# Patient Record
Sex: Male | Born: 1950 | Race: White | Hispanic: No | Marital: Married
Health system: Midwestern US, Community
[De-identification: ages and names within clinical notes are randomized; demographics above are authoritative.]

## PROBLEM LIST (undated history)

## (undated) DIAGNOSIS — D509 Iron deficiency anemia, unspecified: Secondary | ICD-10-CM

## (undated) DIAGNOSIS — L570 Actinic keratosis: Secondary | ICD-10-CM

## (undated) DIAGNOSIS — D472 Monoclonal gammopathy: Secondary | ICD-10-CM

## (undated) DIAGNOSIS — N62 Hypertrophy of breast: Secondary | ICD-10-CM

## (undated) DIAGNOSIS — E875 Hyperkalemia: Secondary | ICD-10-CM

## (undated) DIAGNOSIS — Z0000 Encounter for general adult medical examination without abnormal findings: Secondary | ICD-10-CM

## (undated) DIAGNOSIS — B009 Herpesviral infection, unspecified: Secondary | ICD-10-CM

## (undated) DIAGNOSIS — D500 Iron deficiency anemia secondary to blood loss (chronic): Secondary | ICD-10-CM

## (undated) DIAGNOSIS — F32A Depression, unspecified: Secondary | ICD-10-CM

## (undated) DIAGNOSIS — M1990 Unspecified osteoarthritis, unspecified site: Secondary | ICD-10-CM

## (undated) DIAGNOSIS — C4490 Unspecified malignant neoplasm of skin, unspecified: Secondary | ICD-10-CM

## (undated) HISTORY — PX: TONSILLECTOMY: SHX5217

## (undated) HISTORY — PX: BREAST SURGERY: SHX581

## (undated) HISTORY — DX: Unspecified malignant neoplasm of skin, unspecified: C44.90

## (undated) HISTORY — DX: Unspecified osteoarthritis, unspecified site: M19.90

## (undated) HISTORY — DX: Depression, unspecified: F32.A

---

## 1981-09-15 HISTORY — PX: MASTECTOMY FOR GYNECOMASTIA: SUR847

## 1988-09-15 DIAGNOSIS — Z8619 Personal history of other infectious and parasitic diseases: Secondary | ICD-10-CM

## 1988-09-15 HISTORY — DX: Personal history of other infectious and parasitic diseases: Z86.19

## 2009-02-01 MED ORDER — AMOXICILLIN CLAVULANATE 875 MG-125 MG TAB
875-125 mg | ORAL_TABLET | Freq: Two times a day (BID) | ORAL | Status: AC
Start: 2009-02-01 — End: 2009-02-11

## 2009-02-01 NOTE — Progress Notes (Signed)
HISTORY OF PRESENT ILLNESS  Joe Little is a 58 y.o. male.  HPI  Left ear pain for a few days, diminished hearing. Also had 2 tick bites 4 days ago, spreading red rash from the leg lesion, blister on the periumbilical spot.    PMH: depression adequately controlled  Review of Systems   Constitutional: Negative for fever and chills.   Musculoskeletal: Negative for myalgias.       Wt 174 lb (78.926 kg)  Physical Exam   HENT:   Left Ear: Tympanic membrane is injected, erythematous and retracted.   Skin:            ASSESSMENT and PLAN  Encounter Diagnoses   Code Name Primary?   ??? 382.00 Acute suppurative otitis media without spontaneous rupture of eardrum Yes   ??? 919.5D Tick bite, infected        Orders Placed This Encounter   ??? Amoxicillin-clavulanate (augmentin) 875-125 mg per tablet

## 2009-07-30 MED ORDER — ACYCLOVIR 400 MG TAB
400 mg | ORAL_TABLET | Freq: Every day | ORAL | Status: DC
Start: 2009-07-30 — End: 2009-11-05

## 2009-07-30 NOTE — Telephone Encounter (Signed)
RX as ordered this date left for p/u

## 2009-07-30 NOTE — Telephone Encounter (Signed)
Message copied by Fuller Song on Mon Jul 30, 2009  4:08 PM  ------       Message from: Gale Journey       Created: Mon Jul 30, 2009  3:45 PM       Regarding: wein       Contact: [phone]         Requesting script for Acyclovir.                Mail order - 90 days with 3 refills                     Would like to pu tomorrow afternoon                     Any questions - [phone]

## 2009-11-05 MED ORDER — ACYCLOVIR 400 MG TAB
400 mg | ORAL_TABLET | Freq: Every day | ORAL | Status: DC
Start: 2009-11-05 — End: 2010-05-09

## 2009-11-05 NOTE — Telephone Encounter (Signed)
Once a year.

## 2009-11-05 NOTE — Telephone Encounter (Signed)
Message copied by Fuller Song on Mon Nov 05, 2009  9:30 AM  ------       Message from: Vangie Bicker       Created: Mon Nov 05, 2009  8:59 AM       Regarding: wein         #[phone] refill for acyclovir 400 mg 1/day 90 day for mail order.       Pt requesting this to be mailed to his home today please.

## 2009-11-05 NOTE — Telephone Encounter (Signed)
Is he due to come back?

## 2009-11-05 NOTE — Telephone Encounter (Signed)
rx as ordered mailed to pt

## 2010-05-09 MED ORDER — ACYCLOVIR 400 MG TAB
400 mg | ORAL_TABLET | Freq: Every day | ORAL | Status: DC
Start: 2010-05-09 — End: 2010-11-14

## 2010-05-09 NOTE — Telephone Encounter (Signed)
Message copied by Fuller Song on Thu May 09, 2010  9:02 AM  ------       Message from: Dominga Ferry D       Created: Thu May 09, 2010  8:58 AM       Regarding: [name]         Patient is requesting the following Mail Order prescription:              Acyclovir (ZOVIRAX) 400 mg tablet 90 Tab, Take 1 Tab by mouth daily. 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 [place] [place]        [place] [address]              Patient: [phone]

## 2010-05-09 NOTE — Telephone Encounter (Signed)
rx mailed as ordered

## 2010-11-14 MED ORDER — ACYCLOVIR 400 MG TAB
400 mg | ORAL_TABLET | Freq: Every day | ORAL | Status: DC
Start: 2010-11-14 — End: 2011-05-28

## 2010-11-14 NOTE — Telephone Encounter (Signed)
appt set for this month    rx as ordered mailed to pt

## 2010-11-14 NOTE — Telephone Encounter (Signed)
Message copied by Fuller Song on Thu Nov 14, 2010 10:11 AM  ------       Message from: Dara Lords D       Created: Thu Nov 14, 2010  9:42 AM       Regarding: wein         #[id number] Detailed message allowed. Pt is requesting a prescription for acylovir ?mg 1 po qd 3 month supply mailed to his home address. Please advise.               [address]       [place] [address]

## 2010-12-12 LAB — AMB POC URINALYSIS DIP STICK AUTO W/O MICRO
Bilirubin (UA POC): NEGATIVE
Blood (UA POC): NEGATIVE
Glucose (UA POC): NEGATIVE
Ketones (UA POC): NEGATIVE
Leukocyte esterase (UA POC): NEGATIVE
Nitrites (UA POC): NEGATIVE
Specific gravity (UA POC): 1.03 (ref 1.001–1.035)
Urobilinogen (UA POC): 0.2
pH (UA POC): 5 (ref 4.6–8.0)

## 2010-12-12 MED ORDER — ZOSTER VACCINE LIVE (PF) 19,400 UNIT SUB-Q SOLN
19400 unit/0.65 mL | Freq: Once | SUBCUTANEOUS | Status: AC
Start: 2010-12-12 — End: 2010-12-12

## 2010-12-12 NOTE — Progress Notes (Signed)
Hubbard "Roseanne Reno" Cromley is a 60 y.o.male here for complete physical.    Concerns: none, depression adequately controlled.    Past medical history: I have reviewed and confirmed the past medical history in the chart.  Medications:   Current Outpatient Prescriptions   Medication Sig   ??? therapeutic multivitamin (THERAGRAN) tablet Take 1 Tab by mouth daily.     ??? omega-3 fatty acids-vitamin e (FISH OIL) 1,000 mg Cap Take 1 Cap by mouth.     ??? acyclovir (ZOVIRAX) 400 mg tablet Take 1 Tab by mouth daily. Indications: SUPPRESSION OF RECURRENT HERPES SIMPLEX INFECTION   ??? aripiprazole (ABILIFY) 5 mg tablet Take 5 mg by mouth daily. Indications: DEPRESSION TREATMENT ADJUNCT   ??? duloxetine (CYMBALTA) 60 mg capsule Take 120 mg by mouth daily. Indications: MAJOR DEPRESSIVE DISORDER   ??? buPROPion (WELLBUTRIN) 100 mg tablet Take 100 mg by mouth daily.  Unsure if this is correct dose or duration.     Allergies: reviewed allergy section in the chart  Family History: reviewed Fam Hx section in the chart  Social History: reviewed Soc Hx section in the chart. In general he does not exercise much, but he does do a kind of vigorous [place] dancing and has been doing more recently since they are going to perform in the Easter Parade. They are practicing about once a week now, each dance lasts about three minutes. ItUsed to walk the dog regularly, she died in 11/02/2022. They are probably going to get another dog though.  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  Respiratory: some exertional dyspnea as noted above.  Behvioral/Psych: Depression well-controlled, suicidal thoughts are infrequent now.    Objective:           BP 124/70   Pulse 88   Resp 16   Ht 5' 11.5" (1.816 m)   Wt 172 lb 9.6 oz (78.291 kg)   BMI 23.74 kg/m2;            Nursing note reviewed.   General:  Alert, cooperative, no distress, appears stated age.   Head:  Normocephalic, without obvious abnormality, atraumatic.   Eyes:  Conjunctivae/corneas clear. PERRL, EOMs intact.    Ears:  Normal TMs and external ear canals both ears.   Nose: Nares normal. Mucosa normal. No drainage or sinus tenderness.   Throat: Lips, mucosa, and tongue normal. Teeth and gums normal.   Neck: Supple, symmetrical, trachea midline, no adenopathy, thyroid: no enlargment/tenderness/nodules, no carotid bruit and no JVD.   Back:   Symmetric, no curvature. No CVA tenderness.   Lungs:   Clear to auscultation bilaterally.   Chest wall:  No tenderness or deformity.   Heart:  Regular rate and rhythm, S1, S2 normal, no murmur, click, rub or gallop.   Abdomen:   Soft, non-tender. Bowel sounds normal. No masses,  No organomegaly.   Genitalia:  Normal male without testicular mass, atrophy, or hernia. He continues to have a little thickening in the skin over the left inguinal region, but no mass is present.   Rectal:  Deferred.   Extremities: Extremities normal, atraumatic, no cyanosis or edema.   Pulses: 2+ and symmetric all extremities.   Skin: Skin color, texture, turgor normal. No rashes or lesions   Lymph nodes: Cervical and supraclavicular nodes normal.   Neurologic: CNII-XII intact. Normal strength throughout.  Assessment/Plan:  Encounter Diagnoses   Name Primary?   ??? Preventative health care Yes     Orders Placed This Encounter   ??? CBC W/O DIFF   ??? METABOLIC PANEL, COMPREHENSIVE   ??? LIPID PANEL   ??? AMB POC URINALYSIS DIP STICK AUTO W/O MICRO   ??? buPROPion (WELLBUTRIN) 100 mg tablet   ??? therapeutic multivitamin (THERAGRAN) tablet   ??? omega-3 fatty acids-vitamin e (FISH OIL) 1,000 mg Cap   ??? varicella zoster vacine live (ZOSTAVAX) 19,400 unit SolR when he turns 60 in June.     He will return fasting for the above labs.    Follow-up Disposition:  Return in about 1 year (around 12/12/2011).

## 2010-12-12 NOTE — Progress Notes (Signed)
60 yo male in for cpx=non fasting today.  Sees psychiatrist,Dr.Atri,about 3 times/year.  Overdue for vision exam.  No ekg or cxr in last year.  Colonoscopy done 12/08,Dr.Monroe-due to repeat 12/18.  Does not recall date of last Td.

## 2010-12-27 LAB — METABOLIC PANEL, COMPREHENSIVE
A-G Ratio: 1.8 (ref 1.1–2.5)
ALT (SGPT): 19 IU/L (ref 0–55)
AST (SGOT): 24 IU/L (ref 0–40)
Albumin: 4.2 g/dL (ref 3.5–5.5)
Alk. phosphatase: 70 IU/L (ref 25–150)
BUN/Creatinine ratio: 18 (ref 9–20)
BUN: 16 mg/dL (ref 6–24)
Bilirubin, total: 0.2 mg/dL (ref 0.0–1.2)
CO2: 25 mmol/L (ref 20–32)
Calcium: 9 mg/dL (ref 8.7–10.2)
Chloride: 104 mmol/L (ref 97–108)
Creatinine: 0.87 mg/dL (ref 0.76–1.27)
GFR est AA: 109 mL/min/{1.73_m2} (ref >59)
GFR est non-AA: 94 mL/min/{1.73_m2} (ref >59)
GLOBULIN, TOTAL: 2.3 g/dL (ref 1.5–4.5)
Glucose: 94 mg/dL (ref 65–99)
Potassium: 5.1 mmol/L (ref 3.5–5.2)
Protein, total: 6.5 g/dL (ref 6.0–8.5)
Sodium: 140 mmol/L (ref 134–144)

## 2010-12-27 LAB — LIPID PANEL
Cholesterol, total: 174 mg/dL (ref 100–199)
HDL Cholesterol: 51 mg/dL (ref >39)
LDL, calculated: 106 mg/dL — ABNORMAL HIGH (ref 0–99)
Triglyceride: 84 mg/dL (ref 0–149)
VLDL, calculated: 17 mg/dL (ref 5–40)

## 2010-12-27 LAB — CBC W/O DIFF
HCT: 39.4 % (ref 36.0–50.0)
HGB: 12.4 g/dL — ABNORMAL LOW (ref 12.5–17.0)
MCH: 26.6 pg — ABNORMAL LOW (ref 27.0–34.0)
MCHC: 31.5 g/dL — ABNORMAL LOW (ref 32.0–36.0)
MCV: 84 fL (ref 80–98)
PLATELET: 262 10*3/uL (ref 140–415)
RBC: 4.67 x10E6/uL (ref 4.10–5.60)
RDW: 15.1 % — ABNORMAL HIGH (ref 11.7–15.0)
WBC: 4.9 10*3/uL (ref 4.0–10.5)

## 2011-05-28 MED ORDER — ACYCLOVIR 400 MG TAB
400 mg | ORAL_TABLET | Freq: Every day | ORAL | Status: DC
Start: 2011-05-28 — End: 2011-12-10

## 2011-05-28 NOTE — Telephone Encounter (Signed)
Pt transferred to set up March OV.  rx as ordered mailed to pt after md review.

## 2011-05-28 NOTE — Telephone Encounter (Signed)
Pt request that rx be mail to his resident.

## 2011-12-10 NOTE — Telephone Encounter (Signed)
Patient would life for the written prescription to be mailed to his home address.

## 2011-12-10 NOTE — Telephone Encounter (Signed)
ML please call to clarify if this written rx is for mail order that we can now electronically send.

## 2011-12-11 MED ORDER — ACYCLOVIR 400 MG TAB
400 mg | ORAL_TABLET | Freq: Every day | ORAL | Status: DC
Start: 2011-12-11 — End: 2012-09-21

## 2011-12-11 NOTE — Telephone Encounter (Signed)
Pt does want this sent to [facility] home delivery.

## 2012-01-02 LAB — AMB POC URINALYSIS DIP STICK AUTO W/O MICRO
Bilirubin (UA POC): NEGATIVE
Blood (UA POC): NEGATIVE
Glucose (UA POC): NEGATIVE
Ketones (UA POC): NEGATIVE
Leukocyte esterase (UA POC): NEGATIVE
Nitrites (UA POC): NEGATIVE
Protein (UA POC): NEGATIVE mg/dL
Specific gravity (UA POC): 1.025 (ref 1.001–1.035)
Urobilinogen (UA POC): 0.2 (ref 0.2–1)
pH (UA POC): 6 (ref 4.6–8.0)

## 2012-01-02 MED ORDER — CIPROFLOXACIN 500 MG TAB
500 mg | ORAL_TABLET | Freq: Two times a day (BID) | ORAL | Status: AC
Start: 2012-01-02 — End: 2012-01-16

## 2012-01-02 MED ORDER — ZOSTER VACCINE LIVE (PF) 19,400 UNIT SUB-Q SOLN
19400 unit/0.65 mL | Freq: Once | SUBCUTANEOUS | Status: AC
Start: 2012-01-02 — End: 2012-01-02

## 2012-01-02 NOTE — Progress Notes (Signed)
Joe Little is a 61 y.o.male here for complete physical.    Concerns: Depression is well controlled, Dr. Babette Relic is tapering his medications. He is taking the abilify every other day now, and Cymbalta is down to a single 60 mg capsule.    Past medical history: I have reviewed and confirmed the past medical history in the chart.  Medications:   Current Outpatient Prescriptions   Medication Sig   ??? cholecalciferol (VITAMIN D3) 1,000 unit tablet Take  by mouth daily.   ??? acyclovir (ZOVIRAX) 400 mg tablet Take 1 Tab by mouth daily. Indications: SUPPRESSION OF RECURRENT HERPES SIMPLEX INFECTION   ??? buPROPion (WELLBUTRIN) 100 mg tablet Take 100 mg by mouth daily.     ??? therapeutic multivitamin (THERAGRAN) tablet Take 1 Tab by mouth daily.     ??? omega-3 fatty acids-vitamin e (FISH OIL) 1,000 mg Cap Take 1 Cap by mouth.     ??? aripiprazole (ABILIFY) 5 mg tablet Take 5 mg by mouth every other day. Indications: DEPRESSION TREATMENT ADJUNCT   ??? duloxetine (CYMBALTA) 60 mg capsule Take 60 mg by mouth daily. Indications: MAJOR DEPRESSIVE DISORDER     Allergies: reviewed allergy section in the chart  Family History: reviewed Fam Hx section in the chart  Social History: reviewed Soc Hx section in the chart  Review of Systems: complete ROS done and negative except:   Ears, nose, mouth, throat, and face: hearing difficulties mainly with discrimination of voices in the crowd.  Respiratory: occasional DOE, mainly if he choose not vigorously performing the dances.  Cardiovascular: rare palpitations, no exertional chest pain.  Genitourinary:nocturia x 1 usually. Stream is adequate.  Integument/breast: superficial laceration palm of right hand.  Allergic/Immunologic: allergic rhinitis are flaring up a little bit with recent pollen.    Objective:           BP 110/68   Pulse 66   Temp 97.8 ??F (36.6 ??C)   Resp 12   Ht 5' 11.25" (1.81 m)   Wt 171 lb 12.8 oz (77.928 kg)   BMI 23.79 kg/m2   SpO2 98%;            Nursing note reviewed.   General:  Alert, cooperative, no distress, appears stated age.   Head:  Normocephalic, without obvious abnormality, atraumatic.   Eyes:  Conjunctivae/corneas clear. PERRL, EOMs intact.    Ears:  Normal TMs and external ear canals both ears.   Nose: Nares normal. Mucosa normal. No drainage or sinus tenderness.   Throat: Lips, mucosa, and tongue normal. Teeth and gums normal.   Neck: Supple, symmetrical, trachea midline, no adenopathy, thyroid: no enlargment/tenderness/nodules, no carotid bruit and no JVD.   Back:   Symmetric, no curvature. No CVA tenderness.   Lungs:   Clear to auscultation bilaterally.   Chest wall:  No tenderness or deformity.   Heart:  Regular rate and rhythm, S1, S2 normal, no murmur, click, rub or gallop.   Abdomen:   Soft, non-tender. Bowel sounds normal. No masses,  No organomegaly.   Genitalia:  Normal male without testicular mass, atrophy, or hernia.   Rectal:  Normal tone, 1+ enlarged prostate, no masses or tenderness  Guaiac negative stool.   Extremities: Extremities normal, atraumatic, no cyanosis or edema.   Pulses: 2+ and symmetric all extremities.   Skin: Skin color, texture, turgor normal. No rashes or lesions. 2 seborrheic keratoses on back.   Lymph nodes: Cervical and supraclavicular nodes normal.   Neurologic: CNII-XII intact. Normal strength  throughout.     EKG - incomplete RBBB unchanged.  CXR - normal.    Assessment/Plan:  Encounter Diagnoses   Name Primary?   ??? Preventative health care Yes   ??? Laceration of hand    ??? Need for diphtheria-tetanus-pertussis (Tdap) vaccine    ??? Other malaise and fatigue    ??? Prostatitis      Orders Placed This Encounter   ??? XR CHEST PA LAT   ??? TETANUS, DIPHTHERIA TOXOIDS AND ACELLULAR PERTUSSIS VACCINE (TDAP), IN INDIVIDS. >=7, IM   ??? CBC W/O DIFF   ??? METABOLIC PANEL, COMPREHENSIVE   ??? LIPID PANEL   ??? TESTOSTERONE, TOTAL, SERUM   ??? TSH, 3RD GENERATION   ??? APOLIPOPROTEIN B   ??? AMB POC URINALYSIS DIP STICK AUTO W/O MICRO   ??? AMB POC EKG ROUTINE W/  12 LEADS, INTER & REP   ??? cholecalciferol (VITAMIN D3) 1,000 unit tablet   ??? varicella zoster vacine live (VARICELLA-ZOSTER VIRUS INFECTION PROPHYLAXIS) 19,400 unit SusR injection   ??? ciprofloxacin (CIPRO) 500 mg tablet       There are no Patient Instructions on file for this visit.    Follow-up Disposition: Not on File

## 2012-01-02 NOTE — Progress Notes (Signed)
61 yo male in for fasting cpx.  No ekg or cxr in last year.  Sees psychiatrist,Dr.Atri,every 3  months.  Regular vision checks.  Td 2006.  Did not have flu vaccine this year.  Colonoscopy done 12/08-due again 12/18.

## 2012-01-03 LAB — METABOLIC PANEL, COMPREHENSIVE
A-G Ratio: 2 (ref 1.1–2.5)
ALT (SGPT): 30 IU/L (ref 0–44)
AST (SGOT): 23 IU/L (ref 0–40)
Albumin: 4.5 g/dL (ref 3.6–4.8)
Alk. phosphatase: 71 IU/L (ref 25–160)
BUN/Creatinine ratio: 16 (ref 10–22)
BUN: 15 mg/dL (ref 8–27)
Bilirubin, total: 0.3 mg/dL (ref 0.0–1.2)
CO2: 23 mmol/L (ref 20–32)
Calcium: 9 mg/dL (ref 8.6–10.2)
Chloride: 104 mmol/L (ref 97–108)
Creatinine: 0.96 mg/dL (ref 0.76–1.27)
GFR est non-AA: 86 mL/min/{1.73_m2} (ref >59)
GLOBULIN, TOTAL: 2.3 g/dL (ref 1.5–4.5)
Glucose: 85 mg/dL (ref 65–99)
Potassium: 5 mmol/L (ref 3.5–5.2)
Protein, total: 6.8 g/dL (ref 6.0–8.5)
Sodium: 140 mmol/L (ref 134–144)
eGFR If African American: 99 mL/min/{1.73_m2} (ref >59)

## 2012-01-03 LAB — CVD REPORT: PDF IMAGE: 0

## 2012-01-03 LAB — CBC W/O DIFF
HCT: 40 % (ref 37.5–51.0)
HGB: 12.1 g/dL — ABNORMAL LOW (ref 12.6–17.7)
MCH: 24.6 pg — ABNORMAL LOW (ref 26.6–33.0)
MCHC: 30.3 g/dL — ABNORMAL LOW (ref 31.5–35.7)
MCV: 81 fL (ref 79–97)
PLATELET: 259 10*3/uL (ref 140–415)
RBC: 4.92 x10E6/uL (ref 4.14–5.80)
RDW: 14.7 % (ref 12.3–15.4)
WBC: 4.5 10*3/uL (ref 4.0–10.5)

## 2012-01-03 LAB — LIPID PANEL
Cholesterol, total: 159 mg/dL (ref 100–199)
HDL Cholesterol: 43 mg/dL (ref >39)
LDL, calculated: 98 mg/dL (ref 0–99)
Triglyceride: 90 mg/dL (ref 0–149)
VLDL, calculated: 18 mg/dL (ref 5–40)

## 2012-01-03 LAB — TESTOSTERONE, TOTAL: Testosterone: 507 ng/dL (ref 348–1197)

## 2012-01-03 LAB — TSH 3RD GENERATION: TSH: 0.817 u[IU]/mL (ref 0.450–4.500)

## 2012-01-03 LAB — APOLIPOPROTEIN B: Apolipoprotein B: 90 mg/dL — ABNORMAL HIGH (ref 0–79)

## 2012-09-21 NOTE — Telephone Encounter (Signed)
I had to call the patient and re-schedule him due to Dr.McClendon being out of the office sick, the patient states that he will be out of his Zovirax 400mg  before he can come in on 09/27/12.Please call once another doctor has signed off on it and the patient would like it mailed to him and he will then send it to his mail order company.

## 2012-09-27 NOTE — Progress Notes (Signed)
Chief Complaint   Patient presents with   ??? Request For New Medication     Pt presents in office today to establish care, requesting rx for Shingles vaccine    "Reviewed record in preparation for visit and have obtained the necessary documentation"  ]

## 2012-09-27 NOTE — Patient Instructions (Signed)
Plantar Fasciitis: Exercises  Your Care Instructions  Here are some examples of typical rehabilitation exercises for your condition. Start each exercise slowly. Ease off the exercise if you start to have pain.  Your doctor or physical therapist will tell you when you can start these exercises and which ones will work best for you.  How to do the exercises  Note: Each exercise should create a pulling feeling but should not cause pain.  Towel stretch    1. Sit with your legs extended and knees straight.  2. Place a towel around your foot just under the toes. A towel will give you a more effective stretch.  3. Hold each end of the towel in each hand, with your hands above your knees.  4. Pull back with the towel so that your foot stretches toward you.  5. Hold the position for at least 15 to 30 seconds.  6. Repeat 2 to 4 times a session, up to 5 sessions a day.  Calf stretch    Note: This exercise stretches the muscles at the back of the lower leg (the calf) and the Achilles tendon. Do this exercise 3 or 4 times a day, 5 days a week.  1. Stand facing a wall with your hands on the wall at about eye level. Put the leg you want to stretch about a step behind your other leg.  2. Keeping your back heel on the floor, bend your front knee until you feel a stretch in the back leg.  3. Hold the stretch for 15 to 30 seconds. Repeat 2 to 4 times.  Plantar fascia and calf stretch    Note: Stretching the plantar fascia and calf muscles can increase flexibility and decrease heel pain. You can do this exercise several times each day and before and after activity.  1. Stand on a step as shown above. Be sure to hold on to the banister.  2. Slowly let your heels down over the edge of the step as you relax your calf muscles. You should feel a gentle stretch across the bottom of your foot and up the back of your leg to your knee.  3. Hold the stretch about 15 to 30 seconds, and then tighten your calf muscle a little to bring your heel  back up to the level of the step. Repeat 2 to 4 times.  Towel curls    1. While sitting, place your foot on a towel on the floor and scrunch the towel toward you with your toes.  2. Then, also using your toes, push the towel away from you.  Note: Make this exercise more challenging by placing a weighted object, such as a soup can, on the other end of the towel.  Marble pickups    1. Put marbles on the floor next to a cup.  2. Using your toes, try to lift the marbles up from the floor and put them in the cup.  Follow-up care is a key part of your treatment and safety. Be sure to make and go to all appointments, and call your doctor if you are having problems. It's also a good idea to know your test results and keep a list of the medicines you take.    Where can you learn more?    Go to [email or web address]   Enter [phone] in the search box to learn more about "Plantar Fasciitis: Exercises."    ?? 2006-2013 [employer]. Care instructions adapted under license by [employer] (  which disclaims liability or warranty for this information). This care instruction is for use with your licensed healthcare professional. If you have questions about a medical condition or this instruction, always ask your healthcare professional. [employer], Inc[employer] disclaims any warranty or liability for your use of this information.  Content Version: 9.9.209917; Last Revised: July 19, 2010                Plantar Fasciitis: Exercises  Your Care Instructions  Here are some examples of typical rehabilitation exercises for your condition. Start each exercise slowly. Ease off the exercise if you start to have pain.  Your doctor or physical therapist will tell you when you can start these exercises and which ones will work best for you.  How to do the exercises  Note: Each exercise should create a pulling feeling but should not cause pain.  Towel stretch    7. Sit with your legs extended and knees straight.  8. Place  a towel around your foot just under the toes. A towel will give you a more effective stretch.  9. Hold each end of the towel in each hand, with your hands above your knees.  10. Pull back with the towel so that your foot stretches toward you.  11. Hold the position for at least 15 to 30 seconds.  12. Repeat 2 to 4 times a session, up to 5 sessions a day.  Calf stretch    Note: This exercise stretches the muscles at the back of the lower leg (the calf) and the Achilles tendon. Do this exercise 3 or 4 times a day, 5 days a week.  4. Stand facing a wall with your hands on the wall at about eye level. Put the leg you want to stretch about a step behind your other leg.  5. Keeping your back heel on the floor, bend your front knee until you feel a stretch in the back leg.  6. Hold the stretch for 15 to 30 seconds. Repeat 2 to 4 times.  Plantar fascia and calf stretch    Note: Stretching the plantar fascia and calf muscles can increase flexibility and decrease heel pain. You can do this exercise several times each day and before and after activity.  4. Stand on a step as shown above. Be sure to hold on to the banister.  5. Slowly let your heels down over the edge of the step as you relax your calf muscles. You should feel a gentle stretch across the bottom of your foot and up the back of your leg to your knee.  6. Hold the stretch about 15 to 30 seconds, and then tighten your calf muscle a little to bring your heel back up to the level of the step. Repeat 2 to 4 times.  Towel curls    3. While sitting, place your foot on a towel on the floor and scrunch the towel toward you with your toes.  4. Then, also using your toes, push the towel away from you.  Note: Make this exercise more challenging by placing a weighted object, such as a soup can, on the other end of the towel.  Marble pickups    3. Put marbles on the floor next to a cup.  4. Using your toes, try to lift the marbles up from the floor and put them in the cup.   Follow-up care is a key part of your treatment and safety. Be sure to make and go to all  appointments, and call your doctor if you are having problems. It's also a good idea to know your test results and keep a list of the medicines you take.    Where can you learn more?    Go to [email or web address]   Enter [phone] in the search box to learn more about "Plantar Fasciitis: Exercises."    ?? 2006-2013 [employer]. Care instructions adapted under license by [employer] (which disclaims liability or warranty for this information). This care instruction is for use with your licensed healthcare professional. If you have questions about a medical condition or this instruction, always ask your healthcare professional. [employer], [employer] disclaims any warranty or liability for your use of this information.  Content Version: 9.9.209917; Last Revised: July 19, 2010

## 2012-09-28 NOTE — Progress Notes (Signed)
HISTORY OF PRESENT ILLNESS  Joe Little is a 62 y.o. male.  HPI cc: hsv follow up.   Information obtained per pt. This is a chronic problem. Problem has improved. Here for refill of zoivrax. Has had hsv for years. Last outbreak several years ago.     Notes urinary symptoms--  Hesitancy, nocturia, frequency. Had been told by prior pcp that his prostate was enlarged. But never placed on flomax, no psa drawn recently.   No hx of prostate ca. No dysuria, hematuria.     ROS ROS: as per HPI  Past Medical History   Diagnosis Date   ??? Depression    ??? 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 Actinic keratosis      nose   ??? HSV (herpes simplex virus) infection    ??? PUD (peptic ulcer disease) 1982     Patient Active Problem List   Diagnosis Code   ??? Depression 311   ??? Actinic keratosis 702.0   ??? HSV (herpes simplex virus) infection 054.9     History     Social History   ??? Marital Status: MARRIED     Spouse Name: [name]     Number of Children: 2   ??? 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 Smoking status: Never Smoker    ??? Smokeless tobacco: Never Used   ??? Alcohol Use: 0.5 oz/week     1 Cans of beer per week   ??? Drug Use: No   ??? Sexually Active: Yes -- Male partner(s)     Other Topics Concern   ??? Not on file     Social History Narrative   ??? No narrative on file       Past Surgical History   Procedure Laterality Date   ??? Hx tonsillectomy       age 61   ??? Hx vasectomy  1989   ??? Pr breast surgery procedure unlisted  1981     gynecomastia   ??? Endoscopy, colon, diagnostic  12/08     due in 10 years     Family History   Problem Relation Age of Onset   ??? Alcohol abuse Mother    ??? Lung Disease Mother    ??? Alcohol abuse Father    ??? Cancer Sister    ??? Alcohol abuse Sister    ??? Asthma Sister    ??? Elevated Lipids Sister    ??? Hypertension Sister    ??? Stroke Maternal Grandfather    ??? Seizures Daughter      childhood only         No Known Allergies    Current outpatient  prescriptions:acyclovir (ZOVIRAX) 400 mg tablet, Take 1 Tab by mouth daily. Indications: SUPPRESSION OF RECURRENT HERPES SIMPLEX INFECTION, Disp: 90 Tab, Rfl: 6;  [EXPIRED] varicella zoster vacine live (VARICELLA-ZOSTER VIRUS INFECTION PROPHYLAXIS) 19,400 unit susr injection, 1 Vial by SubCUTAneous route once for 1 dose., Disp: 0.65 mL, Rfl: 0;  tamsulosin (FLOMAX) 0.4 mg capsule, Take 1 Cap by mouth daily., Disp: 30 Cap, Rfl: 3  [EXPIRED] varicella zoster vacine live (VARICELLA-ZOSTER VIRUS INFECTION PROPHYLAXIS) 19,400 unit susr injection, 1 Vial by SubCUTAneous route once for 1 dose., Disp: 0.65 mL, Rfl: 0;  cholecalciferol (VITAMIN D3) 1,000 unit tablet, Take  by mouth daily., Disp: , Rfl: ;  buPROPion (WELLBUTRIN) 100 mg tablet,  Take 100 mg by mouth daily.  , Disp: , Rfl: ;  therapeutic multivitamin (THERAGRAN) tablet, Take 1 Tab by mouth daily.  , Disp: , Rfl:   omega-3 fatty acids-vitamin e (FISH OIL) 1,000 mg Cap, Take 1 Cap by mouth.  , Disp: , Rfl: ;  aripiprazole (ABILIFY) 5 mg tablet, Take 5 mg by mouth every other day. Indications: DEPRESSION TREATMENT ADJUNCT, Disp: , Rfl: ;  duloxetine (CYMBALTA) 60 mg capsule, Take 60 mg by mouth daily. Indications: MAJOR DEPRESSIVE DISORDER, Disp: , Rfl:     BP Readings from Last 3 Encounters:   09/27/12 117/67   01/02/12 110/68   12/12/10 124/70     Wt Readings from Last 3 Encounters:   09/27/12 176 lb (79.833 kg)   01/02/12 171 lb 12.8 oz (77.928 kg)   12/12/10 172 lb 9.6 oz (78.291 kg)       BP 117/67   Pulse 83   Temp(Src) 98.7 ??F (37.1 ??C) (Oral)   Resp 18   Ht 5' 11.2" (1.808 m)   Wt 176 lb (79.833 kg)   BMI 24.42 kg/m2  Nursing notes and vital signs reviewed.            Physical Exam   Constitutional: He appears well-developed and well-nourished.   HENT:   Head: Normocephalic and atraumatic.   Cardiovascular: Normal rate and regular rhythm.    Pulmonary/Chest: Effort normal and breath sounds normal.   Psychiatric: He has a normal mood and affect. His behavior  is normal.       ASSESSMENT and PLAN  1. HSV (herpes simplex virus) infection     2. BPH (benign prostatic hyperplasia)  URINALYSIS W/ RFLX MICROSCOPIC   3. Screening for prostate cancer  URINALYSIS W/ RFLX MICROSCOPIC, PROSTATE SPECIFIC AG (PSA)   1. Stable. Cont med.   2, 3--- do trial of flomax. , obtain ua, psa.   Call with results.

## 2012-10-09 LAB — URINALYSIS W/ RFLX MICROSCOPIC
Bilirubin: NEGATIVE
Blood: NEGATIVE
Glucose: NEGATIVE
Ketone: NEGATIVE
Leukocyte Esterase: NEGATIVE
Nitrites: NEGATIVE
Protein: NEGATIVE
Specific Gravity: 1.024 (ref 1.005–1.030)
Urobilinogen: 0.2 mg/dL (ref 0.0–1.9)
pH (UA): 5.5 (ref 5.0–7.5)

## 2012-10-09 LAB — PSA, DIAGNOSTIC (PROSTATE SPECIFIC AG): Prostate Specific Ag: 0.3 ng/mL (ref 0.0–4.0)

## 2012-10-11 NOTE — Progress Notes (Signed)
Quick Note:    Labs mailed to pt  ______

## 2012-10-11 NOTE — Progress Notes (Signed)
Quick Note:    psa wnl,   ua wnl.   Re: screening for prostate ca  Reviewed lab/imaging. Msg sent for nurse to inform pt via phone or mail.    ______

## 2012-12-02 NOTE — Progress Notes (Signed)
HISTORY OF PRESENT ILLNESS  Joe Little is a 62 y.o. male.  HPI Cc: physical exam/ well visit    Nurse note reviewed.     Pt seen today for physical. Sts that he is doing fine. No recent illnesses. Well balanced diet. Active.  No GI, GU, CV symptoms. Denies any n/v/d,  Unintentional weight loss, f/s/c, no hematuria, dysuria, oliguria, urinary frequency, no chest pain, sob, palpitations.         62 yo male in for cpx-non fasting today.Had PSA and u/a done in January.   No ekg or cxr in last year.   Sees NP at psychiatrist-every 3 month ov.   Regular vision checks.   Colonoscopy done 12/08-due again 12/18.   Tdap 2013.   No visits to er or hospital since last ov.      ROS ROS: As stated in HPI. 11 systems reviewed and are otherwise negative.   Past Medical History   Diagnosis Date   ??? Depression    ??? 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 Actinic keratosis      nose   ??? HSV (herpes simplex virus) infection    ??? PUD (peptic ulcer disease) 1982     Patient Active Problem List   Diagnosis Code   ??? Depression 311   ??? Actinic keratosis 702.0   ??? HSV (herpes simplex virus) infection 054.9     History     Social History   ??? Marital Status: MARRIED     Spouse Name: [name]     Number of Children: 2   ??? 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 Smoking status: Never Smoker    ??? Smokeless tobacco: Never Used   ??? Alcohol Use: 0.5 oz/week     1 Cans of beer per week   ??? Drug Use: No   ??? Sexually Active: Yes -- Male partner(s)     Other Topics Concern   ??? Not on file     Social History Narrative   ??? No narrative on file       Past Surgical History   Procedure Laterality Date   ??? Hx tonsillectomy       age 71   ??? Hx vasectomy  1989   ??? Pr breast surgery procedure unlisted  1981     gynecomastia   ??? Endoscopy, colon, diagnostic  12/08     due in 10 years     Family History   Problem Relation Age of Onset   ??? Alcohol abuse Mother    ??? Lung Disease Mother    ??? Alcohol abuse  Father    ??? Cancer Sister    ??? Alcohol abuse Sister    ??? Asthma Sister    ??? Elevated Lipids Sister    ??? Hypertension Sister    ??? Stroke Maternal Grandfather    ??? Seizures Daughter      childhood only         No Known Allergies    Current outpatient prescriptions:ARIPiprazole (ABILIFY) 2 mg tablet, Take 2 mg by mouth daily., Disp: , Rfl: ;  tamsulosin (FLOMAX) 0.4 mg capsule, Take 1 Cap by mouth daily., Disp: 90 Cap, Rfl: 1;  clotrimazole (LOTRIMIN) 1 % topical cream, Apply  to affected area two (2) times a day., Disp: 15 g, Rfl: 0  acyclovir (ZOVIRAX)  400 mg tablet, Take 1 Tab by mouth daily. Indications: SUPPRESSION OF RECURRENT HERPES SIMPLEX INFECTION, Disp: 90 Tab, Rfl: 6;  cholecalciferol (VITAMIN D3) 1,000 unit tablet, Take  by mouth daily., Disp: , Rfl: ;  buPROPion (WELLBUTRIN) 100 mg tablet, Take 100 mg by mouth daily.  , Disp: , Rfl: ;  therapeutic multivitamin (THERAGRAN) tablet, Take 1 Tab by mouth daily.  , Disp: , Rfl:   omega-3 fatty acids-vitamin e (FISH OIL) 1,000 mg Cap, Take 1 Cap by mouth.  , Disp: , Rfl: ;  duloxetine (CYMBALTA) 60 mg capsule, Take 60 mg by mouth daily. Indications: MAJOR DEPRESSIVE DISORDER, Disp: , Rfl:     BP Readings from Last 3 Encounters:   12/02/12 118/70   09/27/12 117/67   01/02/12 110/68     Wt Readings from Last 3 Encounters:   12/02/12 172 lb 9.6 oz (78.291 kg)   09/27/12 176 lb (79.833 kg)   01/02/12 171 lb 12.8 oz (77.928 kg)       BP 118/70   Pulse 75   Temp(Src) 98 ??F (36.7 ??C)   Resp 14   Ht 5' 11.5" (1.816 m)   Wt 172 lb 9.6 oz (78.291 kg)   BMI 23.74 kg/m2   SpO2 98%  Nursing notes and vital signs reviewed.            Physical Exam   Constitutional: He is oriented to person, place, and time. He appears well-developed and well-nourished.   Pleasant, nad   HENT:   Head: Normocephalic and atraumatic.   Right Ear: Hearing, tympanic membrane, external ear and ear canal normal.   Left Ear: Hearing, tympanic membrane, external ear and ear canal normal.   Nose: Nose  normal.   Mouth/Throat: Uvula is midline, oropharynx is clear and moist and mucous membranes are normal.   Eyes: Conjunctivae are normal. Pupils are equal, round, and reactive to light.   Neck: Normal range of motion. Neck supple.   Cardiovascular: Normal rate, regular rhythm and normal heart sounds.    Pulmonary/Chest: Effort normal and breath sounds normal.   Abdominal: Soft. Bowel sounds are normal. He exhibits no distension. There is no tenderness.   Musculoskeletal: Normal range of motion.   Neurological: He is alert and oriented to person, place, and time.   Skin: Skin is warm and dry.   Ringworm lesion medial aspect of R ankle   Psychiatric: His behavior is normal.   Flat affect       ASSESSMENT and PLAN    ICD-9-CM    1. Routine general medical examination at a health care facility V70.0 AMB POC EKG ROUTINE W/ 12 LEADS, INTER & REP     CBC W/O DIFF     METABOLIC PANEL, COMPREHENSIVE     LIPID PANEL     TSH, 3RD GENERATION   2. Ringworm 110.9      ekg reviewed. Stable no acute changes.   Exam as detailed above.  Health maintenance concerns were discussed as well as providing counseling w/r/t dietary choices, and exercise. Labs ordered . Await results which dictate further management.   F/u in 1 yr for physical.   Ringworm -- lotrimin  Discussed shingles vaccine. Some issues with ins.     Follow-up Disposition:  Return in about 1 year (around 12/02/2013).

## 2012-12-02 NOTE — Progress Notes (Signed)
Quick Note:    RBBB unchanged  ______

## 2012-12-02 NOTE — Progress Notes (Signed)
62 yo male in for cpx-non fasting today.Had PSA and u/a done in January.  No ekg or cxr in last year.  Sees NP at psychiatrist-every 3 month ov.  Regular vision checks.  Colonoscopy done 12/08-due again 12/18.  Tdap 2013.  No visits to er or hospital since last ov.  Med list reviewed.  ..  Reviewed Record in Preparation For Visit And Have Obtained The Necessary Documentation.

## 2012-12-04 NOTE — Addendum Note (Signed)
Addended by: Luberta Mutter on: 12/04/2012 12:21 PM     Modules accepted: Orders

## 2012-12-06 ENCOUNTER — Encounter

## 2012-12-06 LAB — METABOLIC PANEL, COMPREHENSIVE
A-G Ratio: 2 (ref 1.1–2.5)
ALT (SGPT): 19 IU/L (ref 0–44)
AST (SGOT): 20 IU/L (ref 0–40)
Albumin: 4.3 g/dL (ref 3.6–4.8)
Alk. phosphatase: 59 IU/L (ref 39–117)
BUN/Creatinine ratio: 15 (ref 10–22)
BUN: 16 mg/dL (ref 8–27)
Bilirubin, total: 0.4 mg/dL (ref 0.0–1.2)
CO2: 26 mmol/L (ref 19–28)
Calcium: 9.3 mg/dL (ref 8.6–10.2)
Chloride: 104 mmol/L (ref 97–108)
Creatinine: 1.06 mg/dL (ref 0.76–1.27)
GFR est AA: 87 mL/min/{1.73_m2} (ref >59)
GFR est non-AA: 75 mL/min/{1.73_m2} (ref >59)
GLOBULIN, TOTAL: 2.1 g/dL (ref 1.5–4.5)
Glucose: 95 mg/dL (ref 65–99)
Potassium: 6 mmol/L — ABNORMAL HIGH (ref 3.5–5.2)
Protein, total: 6.4 g/dL (ref 6.0–8.5)
Sodium: 141 mmol/L (ref 134–144)

## 2012-12-06 LAB — CBC W/O DIFF
HCT: 44.3 % (ref 37.5–51.0)
HGB: 14.6 g/dL (ref 12.6–17.7)
MCH: 29.2 pg (ref 26.6–33.0)
MCHC: 33 g/dL (ref 31.5–35.7)
MCV: 89 fL (ref 79–97)
PLATELET: 237 10*3/uL (ref 155–379)
RBC: 5 x10E6/uL (ref 4.14–5.80)
RDW: 14.8 % (ref 12.3–15.4)
WBC: 4.1 10*3/uL (ref 3.4–10.8)

## 2012-12-06 LAB — LIPID PANEL
Cholesterol, total: 164 mg/dL (ref 100–199)
HDL Cholesterol: 45 mg/dL (ref >39)
LDL, calculated: 105 mg/dL — ABNORMAL HIGH (ref 0–99)
Triglyceride: 70 mg/dL (ref 0–149)
VLDL, calculated: 14 mg/dL (ref 5–40)

## 2012-12-06 LAB — CVD REPORT: PDF IMAGE: 0

## 2012-12-06 LAB — TSH 3RD GENERATION: TSH: 0.634 u[IU]/mL (ref 0.450–4.500)

## 2012-12-06 NOTE — Progress Notes (Signed)
Quick Note:    Hyperkalemia (high potassium) on labs.   Recheck to ensure (could be lab error) . If still elevated will give kayexalate.   Reviewed lab/imaging. Msg sent for nurse to inform pt.      ______

## 2012-12-06 NOTE — Progress Notes (Signed)
Quick Note:    T.c to pt, made aware of results below, understanding voiced. Pt states he will stop by office this week for lab only.       ______

## 2012-12-06 NOTE — Progress Notes (Signed)
Quick Note:    Otherwise labs essentially wnl. Will send letter once recheck of potassium is done.  ______

## 2012-12-11 LAB — POTASSIUM: Potassium: 5.2 mmol/L (ref 3.5–5.2)

## 2012-12-15 NOTE — Progress Notes (Signed)
Quick Note:    T.c to pt, made aware of results below, understanding voiced.     ______

## 2012-12-15 NOTE — Progress Notes (Signed)
Quick Note:    Repeated potassium level normal.   Reviewed lab/imaging. Msg sent for nurse to inform pt.      ______

## 2013-05-03 NOTE — Progress Notes (Signed)
HISTORY OF PRESENT ILLNESS  Joe Little is a 62 y.o. male.  HPI: Patient comes in C/O sores and pain in his right lower lip for twp days. He has history of genital herpes and takes and takes Zovirax 400 mg daily.  Past Medical History   Diagnosis Date   ??? Depression    ??? 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 Actinic keratosis      nose   ??? HSV (herpes simplex virus) infection    ??? PUD (peptic ulcer disease) 1982     Past Surgical History   Procedure Laterality Date   ??? Hx tonsillectomy       age 48   ??? Hx vasectomy  1989   ??? Pr breast surgery procedure unlisted  1981     gynecomastia   ??? Endoscopy, colon, diagnostic  12/08     due in 10 years   No Known Allergies  Current outpatient prescriptions:magic mouthwash solution, Pharmacy to mix equal portions of ingredients to a total volume as indicated in the dispense amount., Disp: 120 mL, Rfl: 0;  ARIPiprazole (ABILIFY) 2 mg tablet, Take 2 mg by mouth daily., Disp: , Rfl: ;  tamsulosin (FLOMAX) 0.4 mg capsule, Take 1 Cap by mouth daily., Disp: 90 Cap, Rfl: 1  acyclovir (ZOVIRAX) 400 mg tablet, Take 1 Tab by mouth daily. Indications: SUPPRESSION OF RECURRENT HERPES SIMPLEX INFECTION, Disp: 90 Tab, Rfl: 6;  buPROPion (WELLBUTRIN) 100 mg tablet, Take 100 mg by mouth daily.  , Disp: , Rfl: ;  therapeutic multivitamin (THERAGRAN) tablet, Take 1 Tab by mouth daily.  , Disp: , Rfl: ;  omega-3 fatty acids-vitamin e (FISH OIL) 1,000 mg Cap, Take 1 Cap by mouth.  , Disp: , Rfl:   duloxetine (CYMBALTA) 60 mg capsule, Take 60 mg by mouth daily. Indications: MAJOR DEPRESSIVE DISORDER, Disp: , Rfl: ;  clotrimazole (LOTRIMIN) 1 % topical cream, Apply  to affected area two (2) times a day., Disp: 15 g, Rfl: 0;  cholecalciferol (VITAMIN D3) 1,000 unit tablet, Take  by mouth daily., Disp: , Rfl:   Review of Systems   Constitutional: Negative.    Respiratory: Negative.    Cardiovascular: Negative.      Blood pressure 110/70, pulse 88, temperature 98.9 ??F (37.2 ??C), temperature source Oral,  resp. rate 16, height 5' 11.5" (1.816 m), weight 168 lb (76.204 kg), SpO2 97.00%.    Physical Exam   Nursing note and vitals reviewed.  Constitutional: He appears well-developed and well-nourished.   HENT:   Two lesions on her right lower lip. With some yellowish material in the middle   Neck: Neck supple.   Cardiovascular: Normal rate and regular rhythm.    No murmur heard.  Pulmonary/Chest: Effort normal and breath sounds normal.       ASSESSMENT and PLAN    ICD-9-CM    1. History of cold sores V12.09    2. HSV (herpes simplex virus) infection 054.9 magic mouthwash solution   advised to increase Zovirax 400 mg to tid for 5 days

## 2013-05-03 NOTE — Progress Notes (Signed)
Chief Complaint   Patient presents with   ??? Lip Swelling     Sore on the inside of bottom lip, for about a week.  Have been bitting that area when eating for about a month.     " REVIEWED RECORD IN PREPARATION FOR VISIT AND HAVE OBTAINED THE NECESSARY DOCUMENTATION"

## 2013-06-28 NOTE — Telephone Encounter (Signed)
Message copied by Metta Clines on Tue Jun 28, 2013 11:36 AM  ------       Message from: Festus Aloe       Created: Tue Jun 28, 2013 10:29 AM       Regarding: NP [place]/Refill       Contact: [phone]         Pt. requested a refill on Rx("Tamsulosin") mailed to his address on file.         ------

## 2013-08-19 NOTE — Progress Notes (Signed)
Chief Complaint   Patient presents with   ??? Anal Bleeding     "bright red blood in stool twice last week, denies pain"      Pt would also like shingles vaccine RX      "REVIEWED RECORD IN PREPARATION FOR VISIT AND HAVE OBTAINED THE NECESSARY DOCUMENTATION"

## 2013-08-20 LAB — METABOLIC PANEL, BASIC
BUN/Creatinine ratio: 19 (ref 10–22)
BUN: 18 mg/dL (ref 8–27)
CO2: 29 mmol/L (ref 18–29)
Calcium: 9.2 mg/dL (ref 8.6–10.2)
Chloride: 102 mmol/L (ref 97–108)
Creatinine: 0.94 mg/dL (ref 0.76–1.27)
GFR est AA: 100 mL/min/{1.73_m2} (ref >59)
GFR est non-AA: 87 mL/min/{1.73_m2} (ref >59)
Glucose: 85 mg/dL (ref 65–99)
Potassium: 5.4 mmol/L — ABNORMAL HIGH (ref 3.5–5.2)
Sodium: 142 mmol/L (ref 134–144)

## 2013-08-20 LAB — CBC WITH AUTOMATED DIFF
ABS. BASOPHILS: 0 10*3/uL (ref 0.0–0.2)
ABS. EOSINOPHILS: 0.1 10*3/uL (ref 0.0–0.4)
ABS. IMM. GRANS.: 0 10*3/uL (ref 0.0–0.1)
ABS. MONOCYTES: 0.7 10*3/uL (ref 0.1–0.9)
ABS. NEUTROPHILS: 3.6 10*3/uL (ref 1.4–7.0)
Abs Lymphocytes: 1.8 10*3/uL (ref 0.7–3.1)
BASOPHILS: 1 %
EOSINOPHILS: 1 %
HCT: 41.6 % (ref 37.5–51.0)
HGB: 13.5 g/dL (ref 12.6–17.7)
IMMATURE GRANULOCYTES: 0 %
Lymphocytes: 29 %
MCH: 28.8 pg (ref 26.6–33.0)
MCHC: 32.5 g/dL (ref 31.5–35.7)
MCV: 89 fL (ref 79–97)
MONOCYTES: 11 %
NEUTROPHILS: 58 %
PLATELET: 231 10*3/uL (ref 150–379)
RBC: 4.68 x10E6/uL (ref 4.14–5.80)
RDW: 14 % (ref 12.3–15.4)
WBC: 6.2 10*3/uL (ref 3.4–10.8)

## 2013-08-20 LAB — CEA: CEA: 2.4 ng/mL (ref 0.0–4.7)

## 2013-08-20 NOTE — Progress Notes (Signed)
PROBLEM LIST  1. Rectal bleed        MEDICATION LIST  Current Outpatient Prescriptions on File Prior to Visit   Medication Sig Dispense Refill   ??? tamsulosin (FLOMAX) 0.4 mg capsule Take 1 capsule by mouth daily.  90 capsule  3   ??? ARIPiprazole (ABILIFY) 2 mg tablet Take 2 mg by mouth daily.       ??? acyclovir (ZOVIRAX) 400 mg tablet Take 1 Tab by mouth daily. Indications: SUPPRESSION OF RECURRENT HERPES SIMPLEX INFECTION  90 Tab  6   ??? buPROPion (WELLBUTRIN) 100 mg tablet Take 100 mg by mouth daily.         ??? therapeutic multivitamin (THERAGRAN) tablet Take 1 Tab by mouth daily.         ??? omega-3 fatty acids-vitamin e (FISH OIL) 1,000 mg Cap Take 1 Cap by mouth.         ??? duloxetine (CYMBALTA) 60 mg capsule Take 60 mg by mouth daily. Indications: MAJOR DEPRESSIVE DISORDER       ??? magic mouthwash solution Pharmacy to mix equal portions of ingredients to a total volume as indicated in the dispense amount.  120 mL  0   ??? clotrimazole (LOTRIMIN) 1 % topical cream Apply  to affected area two (2) times a day.  15 g  0   ??? cholecalciferol (VITAMIN D3) 1,000 unit tablet Take  by mouth daily.         No current facility-administered medications on file prior to visit.       CHIEF COMPLAINT   Chief Complaint   Patient presents with   ??? Anal Bleeding     "bright red blood in stool twice last week, denies pain"            HPI  Joe Little is a 62 y.o. presenting for  Anal Bleeding    The patient comes in with complaints of bleeding per rectum.  He says he has had two bouts of rectal bleeding over the past one week.  Each time the blood was mixed with the stools.  No frank blood was present.  No dripping of blood was present.  He is not sure if any blood was present when he wiped his stools.  No pain on defecation is present.  History of hemorrhoids in the remote past is present.  No history of hemorrhoids in the recent past is present.  No pain is present.  No constipation is present.  No diarrhea is present.  No black stools  are present.  No abdominal pain or pelvic pain is present.  No nausea or vomiting.  No hemoptysis.  No hematemesis is present.  He says he had a colonoscopy two years ago, which was normal, except for a polyp.  He is not sure who the gastroenterologist was at that time.  He appears to be stable.  No dizziness is present.        Patient is here for (The encounter diagnosis was Rectal bleed.).     PAST MEDICAL HISTORY  Past Medical History   Diagnosis Date   ??? Depression    ??? 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 Actinic keratosis      nose   ??? HSV (herpes simplex virus) infection    ??? PUD (peptic ulcer disease) 1982       PAST SURGICAL HISTORY   has past surgical history that includes tonsillectomy; vasectomy (1989); breast surgery procedure unlisted (  1981); and endoscopy, colon, diagnostic (12/08).    ALLERGIES  No Known Allergies      FAMILY HISTORY/ SOCIAL HISTORY  History     Social History   ??? Marital Status: MARRIED     Spouse Name: [name]     Number of Children: 2   ??? 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 Smoking status: Never Smoker    ??? Smokeless tobacco: Never Used   ??? Alcohol Use: 0.5 oz/week     1 Cans of beer per week   ??? Drug Use: No   ??? Sexually Active: Yes -- Male partner(s)     Other Topics Concern   ??? Not on file     Social History Narrative   ??? No narrative on file         ROS  Constitutional: No recent weight change   ENT/Mouth:  No epistaxis     Cardiovascular:   no chest pain, no SOB   Respiratory: No hemoptysis, No stridor   Gastrointestinal: No hematemesis   Genitourinary: No blood in urine   Musculoskeletal:   No joint redness       Neurological:  No recent seizures, no new numbness, tingling or focal weakness   Psychiatric:   No hallucinations, paranoia or suspiciousness.   Endocrine: No heat or cold intolerance, No polydipsia   Hematologic/Lymphatic:    Eye:  Skin: No bleeding/bruising tendency    No pain   No change in color or  size of any moles     Rest of the ROS per HPI    Any X rays if ordered were personally reviewed.   Old records were reviewed.  Old lab tests were reviewed.  Old radiology reports were reviewed.  Old medicine tests were reviewed.    PHYSICAL EXAMINATION    Visit Vitals   Item Reading   ??? BP 131/82   ??? Pulse 72   ??? Temp(Src) 98 ??F (36.7 ??C) (Oral)   ??? Resp 14   ??? Ht 5' 11.5" (1.816 m)   ??? Wt 176 lb (79.833 kg)   ??? BMI 24.21 kg/m2   ??? SpO2 100%       GEN   AAO x 3, NAD, mood and affect are normal  HEENT  PERRL EOMI no conjunctival pallor  NECK   Supple     No thyromegaly     No JVD, carotid pulse good  RS   Clear to auscultation bilaterally, No crackles, wheezes or rales     No hyperesonance      Tactile fremitus wnl     No respiratory distress or use of accessory muscles  CVS   RRR No murmurs, gallops or rubs  ABDOMEN             Not tender NABS     No rebound/ guarding or rigidity     No hepatomegaly No splenomegaly   No masses, femoral pulse good  PERIANAL EXAM:  No external hemorrhoids or fissures are seen.  No masses are seen.  We will defer rectal exam.  No anoscope is available today.            ASSESSMENT/ PLAN    1. Rectal bleed      Rectal bleeding.  CBC and chem-7 and CEA will be done.  Unfortunately, we are not able to do anoscope in the  clinic today.  The patient will be referred to GI, Dr. Steva Ready for further evaluation and treatment and possible CAT scan and possible sigmoidoscopy and endoscopy.  Last colonoscopy was done two years ago revealed a polyp only.  I am not able to appreciate any external lesions.          Orders Placed This Encounter   ??? CBC WITH AUTOMATED DIFF   ??? METABOLIC PANEL, BASIC   ??? CEA   ??? REFERRAL TO GASTROENTEROLOGY     Referral Priority:  Routine     Referral Type:  Consultation     Referral Reason:  Specialty Services Required     Number of Visits Requested:  1   ??? PR COLLECTION VENOUS BLOOD,VENIPUNCTURE   ??? PR HANDLG&/OR CONVEY OF SPEC FOR TR OFFICE TO LAB   ??? CYANOCOBALAMIN,  VITAMIN B-12, (VITAMIN B-12 PO)     Sig: Take  by mouth.               Patient agreed and verbalized understanding  Patient education was done  Possible side effects of all medications were explained to the patient and the patient expressed desire to try the medications despite explanation of the risks  Advised to go to ER immediately if symptoms worsen  f/u with PCP in a week

## 2013-08-20 NOTE — Progress Notes (Signed)
Quick Note:    Potassium high   Repeat chem 7 on the 15th- lab visit only       ______

## 2013-08-22 ENCOUNTER — Encounter

## 2013-08-22 NOTE — Progress Notes (Signed)
Quick Note:    Pt agreed. Patient is going to return for a chem 7 on 08/29/13  ______

## 2013-08-25 NOTE — Telephone Encounter (Signed)
Patient is scheduled to see Dr.Talreja on 10/06/13 there office is requesting any notes and labs associated with this visit.There office # [phone] and there fax # [phone].

## 2013-08-25 NOTE — Telephone Encounter (Signed)
Pt last OV note and labs were faxed per patient request.

## 2013-08-30 LAB — METABOLIC PANEL, BASIC
BUN/Creatinine ratio: 18 (ref 10–22)
BUN: 16 mg/dL (ref 8–27)
CO2: 24 mmol/L (ref 18–29)
Calcium: 9.1 mg/dL (ref 8.6–10.2)
Chloride: 102 mmol/L (ref 97–108)
Creatinine: 0.89 mg/dL (ref 0.76–1.27)
GFR est AA: 106 mL/min/{1.73_m2} (ref >59)
GFR est non-AA: 92 mL/min/{1.73_m2} (ref >59)
Glucose: 97 mg/dL (ref 65–99)
Potassium: 5 mmol/L (ref 3.5–5.2)
Sodium: 141 mmol/L (ref 134–144)

## 2013-08-30 NOTE — Progress Notes (Signed)
Quick Note:    Labs wnl     ______

## 2013-08-30 NOTE — Progress Notes (Signed)
Quick Note:    Pt informed labs normal.  ______

## 2013-09-01 LAB — SPECIMEN STATUS REPORT

## 2013-10-28 ENCOUNTER — Encounter

## 2013-10-28 MED ORDER — ACYCLOVIR 400 MG TAB
400 mg | ORAL_TABLET | Freq: Every day | ORAL | Status: DC
Start: 2013-10-28 — End: 2014-11-15

## 2014-04-11 ENCOUNTER — Encounter

## 2014-04-11 LAB — AMB POC URINALYSIS DIP STICK AUTO W/O MICRO
Bilirubin (UA POC): NEGATIVE
Blood (UA POC): NEGATIVE
Glucose (UA POC): NEGATIVE
Ketones (UA POC): NEGATIVE
Leukocyte esterase (UA POC): NEGATIVE
Nitrites (UA POC): NEGATIVE
Specific gravity (UA POC): 1.02 (ref 1.001–1.035)
Urobilinogen (UA POC): 0.2 (ref 0.2–1)
pH (UA POC): 7.5 (ref 4.6–8.0)

## 2014-04-11 MED ORDER — CYCLOBENZAPRINE 5 MG TAB
5 mg | ORAL_TABLET | Freq: Two times a day (BID) | ORAL | Status: AC
Start: 2014-04-11 — End: ?

## 2014-04-11 MED ORDER — DICLOFENAC 75 MG TAB, DELAYED RELEASE
75 mg | ORAL_TABLET | Freq: Two times a day (BID) | ORAL | Status: AC
Start: 2014-04-11 — End: ?

## 2014-04-11 NOTE — Progress Notes (Signed)
PROBLEM LIST  1. Annual physical exam    2. Depression    3. Neck pain    4. Gynecomastia    5. Proteinuria        MEDICATION LIST  Current Outpatient Prescriptions on File Prior to Visit   Medication Sig Dispense Refill   ??? acyclovir (ZOVIRAX) 400 mg tablet Take 1 Tab by mouth daily. Indications: SUPPRESSION OF RECURRENT HERPES SIMPLEX INFECTION 90 Tab 6   ??? CYANOCOBALAMIN, VITAMIN B-12, (VITAMIN B-12 PO) Take  by mouth.     ??? tamsulosin (FLOMAX) 0.4 mg capsule Take 1 capsule by mouth daily. 90 capsule 3   ??? ARIPiprazole (ABILIFY) 2 mg tablet Take 2 mg by mouth daily.     ??? clotrimazole (LOTRIMIN) 1 % topical cream Apply  to affected area two (2) times a day. 15 g 0   ??? buPROPion (WELLBUTRIN) 100 mg tablet Take 100 mg by mouth daily.       ??? therapeutic multivitamin (THERAGRAN) tablet Take 1 Tab by mouth daily.       ??? omega-3 fatty acids-vitamin e (FISH OIL) 1,000 mg Cap Take 1 Cap by mouth.       ??? duloxetine (CYMBALTA) 60 mg capsule Take 60 mg by mouth daily. Indications: MAJOR DEPRESSIVE DISORDER     ??? magic mouthwash solution Pharmacy to mix equal portions of ingredients to a total volume as indicated in the dispense amount. 120 mL 0   ??? cholecalciferol (VITAMIN D3) 1,000 unit tablet Take  by mouth daily.       No current facility-administered medications on file prior to visit.       CHIEF COMPLAINT   Chief Complaint   Patient presents with   ??? Well Male           HPI  Joe Little is a 63 y.o. presenting for  Well Male    The patient is here for a physical exam and multiple things.  No chest pain, no difficulty in breathing, no nausea, no vomiting, no numbness, no tingling or focal weakness.  No cough, no palpitations.  No fevers, no chills, no body aches.  Mood is good.  No suicidal ideation, no homicidal ideation.  No recent loss of weight.  No loss of appetite.  No hemoptysis.  No hematemesis.  No blood in the stools.  No black stools.  No  constipation.  No diarrhea.  He does have depression.  This is stable.  No suicidal ideation, no homicidal ideation.  No crying spells.  No early morning awakening.  No suicidal plans.  No access to any agents of suicide or homicide is present.  Also, he is complaining of some neck pain on the left side.  No midline pain is present.  It has been going on for the past three to four weeks.  No radiation of the pain is present.  No numbness, no tingling, no focal weakness is present.  He has used over-the-counter medication with no improvement in pain.  Also, he has swelling of the breasts.  Gynecomastia is present.  He is wondering what he can do about it.  Ultrasound will be done and we will go from there.  No redness is present.  No tenderness is present.  Also, protein on the urine.  No history of multiple myeloma.  No polyuria or polyphagia.  No nephrotic syndrome is present.      Patient is here for (The primary encounter diagnosis was Annual physical exam. Diagnoses  of Depression, Neck pain, Gynecomastia, and Proteinuria were also pertinent to this visit.).     PAST MEDICAL HISTORY  Past Medical History   Diagnosis Date   ??? Depression    ??? 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 Actinic keratosis      nose   ??? HSV (herpes simplex virus) infection    ??? PUD (peptic ulcer disease) 1982       PAST SURGICAL HISTORY   has past surgical history that includes tonsillectomy; vasectomy (1989); breast surgery procedure unlisted (1981); and endoscopy, colon, diagnostic (12/08).    ALLERGIES  No Known Allergies      FAMILY HISTORY/ SOCIAL HISTORY  History     Social History   ??? Marital Status: MARRIED     Spouse Name: [name]     Number of Children: 2   ??? 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 Smoking status: Never Smoker    ??? Smokeless tobacco: Never Used   ??? Alcohol Use: 0.5 oz/week     1 Cans of beer per week   ??? Drug Use: No   ??? Sexual Activity:      Partners: Female     Other Topics Concern   ??? Not on file     Social History Narrative         ROS  Constitutional: No recent weight change   ENT/Mouth:  No epistaxis     Cardiovascular:   no chest pain, no SOB   Respiratory: No hemoptysis, No stridor   Gastrointestinal: No hematemesis   Genitourinary: No blood in urine   Musculoskeletal:   No joint redness       Neurological:  No recent seizures, no new numbness, tingling or focal weakness   Psychiatric:   No hallucinations, paranoia or suspiciousness.   Endocrine: No heat or cold intolerance, No polydipsia   Hematologic/Lymphatic:    Eye:  Skin: No bleeding/bruising tendency    No pain   No change in color or size of any moles     Rest of the ROS per HPI    Any X rays if ordered were personally reviewed.   Old records were reviewed.  Old lab tests were reviewed.  Old radiology reports were reviewed.  Old medicine tests were reviewed.    PHYSICAL EXAMINATION    Visit Vitals   Item Reading   ??? BP 105/67 mmHg   ??? Pulse 68   ??? Temp(Src) 97.9 ??F (36.6 ??C) (Oral)   ??? Resp 18   ??? Ht 5' 11.5" (1.816 m)   ??? Wt 169 lb (76.658 kg)   ??? BMI 23.24 kg/m2   ??? SpO2 98%       GEN   AAO x 3, NAD, mood and affect are normal  HEENT  PERRL EOMI no conjunctival pallor  NECK   Supple     No thyromegaly     No JVD, carotid pulse good  RS   Clear to auscultation bilaterally, No crackles, wheezes or rales     No hyperesonance      Tactile fremitus wnl     No respiratory distress or use of accessory muscles  CVS   RRR No murmurs, gallops or rubs  ABDOMEN             Not tender NABS     No rebound/  guarding or rigidity     No hepatomegaly No splenomegaly   No masses, femoral pulse good  EXT   No CCE  CNS:  Cranial nerves II - XII are intact.  Power is 5/5 bilaterally.  Sensation is intact.  Romberg is negative.  Knee reflexes are 2+ bilaterally.   HEENT:  Pharynx is normal.  No exudates are seen.  Tympanic membranes are normal.  There is no sinus tenderness.  Nasal mucosa is pink.    No axillary lymphadenopathy.  No inguinal lymphadenopathy is present.   GU EXAM:  He refuses scrotal exam.   PROSTATE EXAM:  Prostate is slightly enlarged, symmetrical.  No nodules are felt.  It is firm in consistency.  No tenderness is present.          ASSESSMENT/ PLAN    1. Annual physical exam    2. Depression    3. Neck pain    4. Gynecomastia    5. Proteinuria      Physical examination as above.   Counseled in detail on diet and exercise.  EKG will be done.  Blood test will be done.    Stable.  Continue present management.  No suicidal ideation, no homicidal ideation.    We will start the patient on the below medications.  X-ray of the neck will be done.    Ultrasound of the breast bilaterally will be done.    UPEP and SPEP will be done.      He will follow up with GI as soon as possible for colonoscopy.  Number and referral was given.         Orders Placed This Encounter   ??? XR SPINE CERV 4 OR 5 V     Standing Status: Future      Number of Occurrences: 1      Standing Expiration Date: 10/09/2014     Order Specific Question:  Reason for Exam     Answer:  pain     Order Specific Question:  Is Patient Allergic to Contrast Dye?     Answer:  No   ??? US BREAST BI COMPLETE     Standing Status: Future      Number of Occurrences:       Standing Expiration Date: 05/13/2015     Order Specific Question:  Reason for Exam     Answer:  gynecomastia   ??? CBC WITH AUTOMATED DIFF   ??? METABOLIC PANEL, BASIC   ??? LIPID PANEL   ??? TSH, 3RD GENERATION   ??? HEPATIC FUNCTION PANEL   ??? VITAMIN D, 25 HYDROXY   ??? HEMOGLOBIN A1C   ??? PROSTATE SPECIFIC AG   ??? PROTEIN ELECTROPHORESIS   ??? PROTEIN ELECTROPHORESIS, URINE RANDOM   ??? REFERRAL TO GASTROENTEROLOGY     Referral Priority:  Routine     Referral Type:  Consultation     Referral Reason:  Specialty Services Required     Number of Visits Requested:  1   ??? AMB POC URINALYSIS DIP STICK AUTO W/O MICRO   ??? AMB POC EKG ROUTINE W/ 12 LEADS, INTER & REP      Order Specific Question:  Reason for Exam:     Answer:  ANNUAL EXAM   ??? PR COLLECTION VENOUS BLOOD,VENIPUNCTURE   ??? PR HANDLG&/OR CONVEY OF SPEC FOR TR OFFICE TO LAB   ??? diclofenac EC (VOLTAREN) 75 mg EC tablet     Sig: Take 1 Tab by mouth two (2) times a  day.     Dispense:  60 Tab     Refill:  0   ??? cyclobenzaprine (FLEXERIL) 5 mg tablet     Sig: Take 1 Tab by mouth two (2) times a day.     Dispense:  60 Tab     Refill:  0               Patient agreed and verbalized understanding  Patient education was done  Possible side effects of all medications were explained to the patient and the patient expressed desire to try the medications despite explanation of the risks  Advised to go to ER immediately if symptoms worsen  f/u with PCP in a week

## 2014-04-11 NOTE — Progress Notes (Signed)
Quick Note:        Please let lab know have added upep and spep to labs    ______

## 2014-04-11 NOTE — Progress Notes (Signed)
Chief Complaint   Patient presents with   ??? Well Male     1. Have you been to the ER, urgent care clinic since your last visit?  Hospitalized since your last visit?No    2. Have you seen or consulted any other health care providers outside of the [facility] since your last visit?  Include any pap smears or colon screening. No  REVIEWED [facility] AND ALL NECESSARY DOCUMENTATION

## 2014-04-12 LAB — METABOLIC PANEL, BASIC
BUN/Creatinine ratio: 11 (ref 10–22)
BUN: 12 mg/dL (ref 8–27)
CO2: 26 mmol/L (ref 18–29)
Calcium: 9.1 mg/dL (ref 8.6–10.2)
Chloride: 101 mmol/L (ref 97–108)
Creatinine: 1.08 mg/dL (ref 0.76–1.27)
GFR est AA: 84 mL/min/{1.73_m2} (ref >59)
GFR est non-AA: 73 mL/min/{1.73_m2} (ref >59)
Glucose: 92 mg/dL (ref 65–99)
Potassium: 5 mmol/L (ref 3.5–5.2)
Sodium: 138 mmol/L (ref 134–144)

## 2014-04-12 LAB — CBC WITH AUTOMATED DIFF
ABS. BASOPHILS: 0 10*3/uL (ref 0.0–0.2)
ABS. EOSINOPHILS: 0.1 10*3/uL (ref 0.0–0.4)
ABS. IMM. GRANS.: 0 10*3/uL (ref 0.0–0.1)
ABS. MONOCYTES: 0.6 10*3/uL (ref 0.1–0.9)
ABS. NEUTROPHILS: 2.2 10*3/uL (ref 1.4–7.0)
Abs Lymphocytes: 1.3 10*3/uL (ref 0.7–3.1)
BASOPHILS: 1 %
EOSINOPHILS: 2 %
HCT: 39.9 % (ref 37.5–51.0)
HGB: 12.9 g/dL (ref 12.6–17.7)
IMMATURE GRANULOCYTES: 0 %
Lymphocytes: 31 %
MCH: 27.7 pg (ref 26.6–33.0)
MCHC: 32.3 g/dL (ref 31.5–35.7)
MCV: 86 fL (ref 79–97)
MONOCYTES: 15 %
NEUTROPHILS: 51 %
PLATELET: 255 10*3/uL (ref 150–379)
RBC: 4.66 x10E6/uL (ref 4.14–5.80)
RDW: 14.4 % (ref 12.3–15.4)
WBC: 4.3 10*3/uL (ref 3.4–10.8)

## 2014-04-12 LAB — LIPID PANEL
Cholesterol, total: 156 mg/dL (ref 100–199)
HDL Cholesterol: 53 mg/dL (ref >39)
LDL, calculated: 92 mg/dL (ref 0–99)
Triglyceride: 55 mg/dL (ref 0–149)
VLDL, calculated: 11 mg/dL (ref 5–40)

## 2014-04-12 LAB — HEPATIC FUNCTION PANEL
ALT (SGPT): 16 IU/L (ref 0–44)
AST (SGOT): 17 IU/L (ref 0–40)
Albumin: 4.4 g/dL (ref 3.6–4.8)
Alk. phosphatase: 61 IU/L (ref 39–117)
Bilirubin, direct: 0.08 mg/dL (ref 0.00–0.40)
Bilirubin, total: 0.3 mg/dL (ref 0.0–1.2)
Protein, total: 6.5 g/dL (ref 6.0–8.5)

## 2014-04-12 LAB — VITAMIN D, 25 HYDROXY: VITAMIN D, 25-HYDROXY: 33.1 ng/mL (ref 30.0–100.0)

## 2014-04-12 LAB — PSA, DIAGNOSTIC (PROSTATE SPECIFIC AG): Prostate Specific Ag: 0.6 ng/mL (ref 0.0–4.0)

## 2014-04-12 LAB — TSH 3RD GENERATION: TSH: 1.03 u[IU]/mL (ref 0.450–4.500)

## 2014-04-12 LAB — HEMOGLOBIN A1C WITH EAG: Hemoglobin A1c: 5.7 % — ABNORMAL HIGH (ref 4.8–5.6)

## 2014-04-12 LAB — CVD REPORT

## 2014-04-12 NOTE — Progress Notes (Signed)
Quick Note:        Mild pre diabetes watch diet exercise        ______

## 2014-04-12 NOTE — Progress Notes (Signed)
Quick Note:        Joe Little in lab was made aware of additional labs and was given urine sample    ______

## 2014-04-13 LAB — PROTEIN ELECTROPHORESIS, URINE RANDOM
Albumin, urine: 8.8 %
Alpha-1-Globulin, urine: 1.3 %
Alpha-2-Globulin, urine: 15.6 %
Beta Globulin, urine: 61.7 %
Gamma Globulin, urine: 12.6 %
M-Spike, %: 15.3 % — ABNORMAL HIGH
Protein total, urine: 11 mg/dL (ref 0.0–15.0)

## 2014-04-13 NOTE — Progress Notes (Signed)
Quick Note:        Pt was called and informed mild pre diabetes and to watch diet/exercise    ______

## 2014-04-14 NOTE — Progress Notes (Signed)
Quick Note:        Fu appt abnrml labs        VERY IMPORTANT        ______

## 2014-04-17 NOTE — Progress Notes (Signed)
Quick Note:        Pt was called vm left for a rtn call    ______

## 2014-04-19 NOTE — Progress Notes (Signed)
PROBLEM LIST  1. Abnormal urine protein electrophoresis    2. Pre-diabetes    3. BPH (benign prostatic hyperplasia)    4. Depression        MEDICATION LIST  Current Outpatient Prescriptions on File Prior to Visit   Medication Sig Dispense Refill   ??? diclofenac EC (VOLTAREN) 75 mg EC tablet Take 1 Tab by mouth two (2) times a day. 60 Tab 0   ??? cyclobenzaprine (FLEXERIL) 5 mg tablet Take 1 Tab by mouth two (2) times a day. 60 Tab 0   ??? CYANOCOBALAMIN, VITAMIN B-12, (VITAMIN B-12 PO) Take  by mouth.     ??? tamsulosin (FLOMAX) 0.4 mg capsule Take 1 capsule by mouth daily. 90 capsule 3   ??? ARIPiprazole (ABILIFY) 2 mg tablet Take 2 mg by mouth daily.     ??? clotrimazole (LOTRIMIN) 1 % topical cream Apply  to affected area two (2) times a day. 15 g 0   ??? buPROPion (WELLBUTRIN) 100 mg tablet Take 100 mg by mouth daily.       ??? therapeutic multivitamin (THERAGRAN) tablet Take 1 Tab by mouth daily.       ??? omega-3 fatty acids-vitamin e (FISH OIL) 1,000 mg Cap Take 1 Cap by mouth.       ??? duloxetine (CYMBALTA) 60 mg capsule Take 60 mg by mouth daily. Indications: MAJOR DEPRESSIVE DISORDER     ??? acyclovir (ZOVIRAX) 400 mg tablet Take 1 Tab by mouth daily. Indications: SUPPRESSION OF RECURRENT HERPES SIMPLEX INFECTION 90 Tab 6   ??? magic mouthwash solution Pharmacy to mix equal portions of ingredients to a total volume as indicated in the dispense amount. 120 mL 0   ??? cholecalciferol (VITAMIN D3) 1,000 unit tablet Take  by mouth daily.       No current facility-administered medications on file prior to visit.       CHIEF COMPLAINT   Chief Complaint   Patient presents with   ??? Follow-up     Was told his A1C was a little high           HPI  Joe Little is a 63 y.o. presenting for  Follow-up    The patient is here for multiple things.  Firstly, he had some proteinuria on his exam last time.  UPEP and SPEP was done.  UPEP revealed M spike.  No history of multiple myeloma.  No history of MGUS in the past.  No  polyuria is present.  No oliguria.  No nephrotic syndrome.  No facial edema is present.  No family history of multiple myeloma or MGUS is present.  Also, predaibetes, new.  He watches his diet to some extent.  He exercises to some extent.  He is not on any Metformin therapy.  No polyphagia, no polydipsia.  No foot ulcers.  No foot infections.  Also, BPH.  Appears to be doing better.  He is compliant with therapy.  No nausea or vomiting.  No dysuria, no hematuria is present.  No abdominal pain, no pelvic pain is present.  No recent loss of weight or loss of appetite is present.  Also, depression, appears to be doing better.  He is compliant with therapy.  No suicidal ideation, no homicidal ideation.  No crying spells.  No early morning awakening.  No suicidal plans.  No access to any agents of suicide or homicide is present.        Patient is here for (The primary encounter diagnosis was  Abnormal urine protein electrophoresis. Diagnoses of Pre-diabetes, BPH (benign prostatic hyperplasia), and Depression were also pertinent to this visit.).     PAST MEDICAL HISTORY  Past Medical History   Diagnosis Date   ??? Depression    ??? 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 Actinic keratosis      nose   ??? HSV (herpes simplex virus) infection    ??? PUD (peptic ulcer disease) 1982       PAST SURGICAL HISTORY   has past surgical history that includes tonsillectomy; vasectomy (1989); breast surgery procedure unlisted (1981); and endoscopy, colon, diagnostic (12/08).    ALLERGIES  No Known Allergies      FAMILY HISTORY/ SOCIAL HISTORY  History     Social History   ??? Marital Status: MARRIED     Spouse Name: [name]     Number of Children: 2   ??? 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 Smoking status: Never Smoker    ??? Smokeless tobacco: Never Used   ??? Alcohol Use: 0.5 oz/week     1 Cans of beer per week   ??? Drug Use: No   ??? Sexual Activity:     Partners: Female      Other Topics Concern   ??? Not on file     Social History Narrative         ROS  Constitutional: No recent weight change   ENT/Mouth:  No epistaxis     Cardiovascular:   no chest pain, no SOB   Respiratory: No hemoptysis, No stridor   Gastrointestinal: No hematemesis   Genitourinary: No blood in urine   Musculoskeletal:   No joint redness       Neurological:  No recent seizures, no new numbness, tingling or focal weakness   Psychiatric:   No hallucinations, paranoia or suspiciousness.   Endocrine: No heat or cold intolerance, No polydipsia   Hematologic/Lymphatic:    Eye:  Skin: No bleeding/bruising tendency    No pain   No change in color or size of any moles     Rest of the ROS per HPI    Any X rays if ordered were personally reviewed.   Old records were reviewed.  Old lab tests were reviewed.  Old radiology reports were reviewed.  Old medicine tests were reviewed.    PHYSICAL EXAMINATION    Visit Vitals   Item Reading   ??? BP 101/52 mmHg   ??? Pulse 76   ??? Temp(Src) 98.1 ??F (36.7 ??C) (Oral)   ??? Resp 18   ??? Ht 5' 11"  (1.803 m)   ??? Wt 176 lb (79.833 kg)   ??? BMI 24.56 kg/m2   ??? SpO2 98%       GEN   AAO x 3, NAD, mood and affect are normal  HEENT  PERRL EOMI no conjunctival pallor  NECK   Supple     No thyromegaly     No JVD, carotid pulse good  RS   Clear to auscultation bilaterally, No crackles, wheezes or rales     No hyperesonance      Tactile fremitus wnl     No respiratory distress or use of accessory muscles  CVS   RRR No murmurs, gallops or rubs  ABDOMEN             Not tender NABS  No rebound/ guarding or rigidity     No hepatomegaly No splenomegaly   No masses, femoral pulse good  EXT   No CCE      ASSESSMENT/ PLAN    1. Abnormal urine protein electrophoresis    2. Pre-diabetes    3. BPH (benign prostatic hyperplasia)    4. Depression      1. Abnormal UPEP with M spike.  Blood tests as below will be done.  Urine tests as below will be done.  Refer to oncology to rule out multiple  myeloma, MGUS and further evaluation and treatment.    2. Counseled in detail on diet and exercise.   Continue present management.  Repeat hemoglobin A1C in three months.    3. Doing better.  Continue present management.  No dysuria, no hematuria.    4. Stable.  Continue present management.  No suicidal ideation, no homicidal ideation.          Orders Placed This Encounter   ??? PROTEIN ELECTROPHORESIS   ??? IMMUNOPHENOTYPING PROFILE     Order Specific Question:  Specimen type     Answer:  Blood [2]   ??? BETA-2 MICROGLOBULIN   ??? PROTEIN ELECTROPHORESIS + IFE, UR, 24HR   ??? PROTEIN ELECTROPHORESIS + IFE, UR, 24HR   ??? REFERRAL TO ONCOLOGY     Referral Priority:  Routine     Referral Type:  Consultation     Referral Reason:  Specialty Services Required     Requested Specialty:  Oncology     Number of Visits Requested:  1   ??? VARICELLA-ZOSTER VACINE LIVE 19,400 unit/0.65 mL susr injection     Sig:      Refill:  0               Patient agreed and verbalized understanding  Patient education was done  Possible side effects of all medications were explained to the patient and the patient expressed desire to try the medications despite explanation of the risks  Advised to go to ER immediately if symptoms worsen  f/u with PCP in a week

## 2014-04-19 NOTE — Progress Notes (Signed)
Quick Note:        Pt was in office today for follow up of abnormal labs    ______

## 2014-04-20 LAB — PROTEIN ELECTROPHORESIS
A/G ratio: 1.4 (ref 0.7–2.0)
ALPHA-2 GLOBULIN: 0.5 g/dL (ref 0.4–1.2)
Albumin: 3.4 g/dL (ref 3.2–5.6)
Alpha-1-globulin: 0.2 g/dL (ref 0.1–0.4)
Beta globulin: 0.9 g/dL (ref 0.6–1.3)
Gamma globulin: 0.7 g/dL (ref 0.5–1.6)
Globulin, total: 2.4 g/dL (ref 2.0–4.5)
Protein, total: 5.8 g/dL — ABNORMAL LOW (ref 6.0–8.5)

## 2014-04-20 LAB — BETA-2 MICROGLOBULIN: Beta-2 Microglobulin, serum: 1.6 mg/L (ref 0.6–2.4)

## 2014-04-21 LAB — IMMUNOPHENOTYPING PROFILE

## 2014-04-22 NOTE — Progress Notes (Signed)
Quick Note:        Blood tests so far wnl    Fu with oncology as advised    ______

## 2014-04-25 NOTE — Progress Notes (Signed)
Quick Note:        Pt was called and vm left for a rtn call    ______

## 2014-04-25 NOTE — Telephone Encounter (Signed)
Patient is returning a phone call.

## 2014-04-26 LAB — PROTEIN ELECTROPHORESIS + IFE, UR, 24HR
Albumin, urine: 29.5 %
Alpha-1-Globulin, urine: 4.7 %
Alpha-2-Globulin, urine: 12.6 %
Beta-globulin, urine: 42.9 %
Gamma Globulin, urine: 10.3 %
Protein total, urine: 4 mg/dL (ref 0.0–15.0)

## 2014-04-26 LAB — SPECIMEN STATUS REPORT

## 2014-04-29 NOTE — Progress Notes (Signed)
Quick Note:        Blood and urine tests so far wnl    Fu with oncology as advised    ______

## 2014-05-04 LAB — PROTEIN ELECTROPHORESIS
A/G ratio: 1.4 (ref 0.7–2.0)
ALPHA-2 GLOBULIN: 0.6 g/dL (ref 0.4–1.2)
Albumin: 3.7 g/dL (ref 3.2–5.6)
Alpha-1-globulin: 0.2 g/dL (ref 0.1–0.4)
Beta globulin: 1.1 g/dL (ref 0.6–1.3)
Gamma globulin: 0.9 g/dL (ref 0.5–1.6)
Globulin, total: 2.7 g/dL (ref 2.0–4.5)
Protein, total: 6.4 g/dL (ref 6.0–8.5)

## 2014-05-04 LAB — SPECIMEN STATUS REPORT

## 2014-05-05 NOTE — Progress Notes (Signed)
Quick Note:        No answer to call. Letter sent    ______

## 2014-06-23 ENCOUNTER — Ambulatory Visit
Admit: 2014-06-23 | Discharge: 2014-06-23 | Payer: PRIVATE HEALTH INSURANCE | Attending: Internal Medicine | Primary: Family Medicine

## 2014-06-23 DIAGNOSIS — D472 Monoclonal gammopathy: Secondary | ICD-10-CM

## 2014-06-23 NOTE — Patient Instructions (Signed)
There was a small amount of abnormal protein in your random urine test.    However, it did not show up in your 24 hour urine test or in your blood test.    I wonder if this is a false positive test.    The concern raised on the dipstick test is that you may have a monoclonal protein ("one type of protein").      This can be related to a condition called MGUS (monoclonal gammopathy of undetermined significance).  This can occur in about 5% of people over 70.    However, a very small portion of people with abnormal proteins can develop a cancer called multiple myeloma.  You have no symptoms of this.  The symptoms of myeloma are:  -anemia  -high calcium  -kidney problems  -or lytic lesions (punched out holes in the bone).    Options are:  -doing a bone marrow biopsy now  Versus  -repeating all the tests in the new year and doing a bone marrow biopsy if there are any abnormalities at that time

## 2014-06-23 NOTE — Progress Notes (Signed)
Joe Little  63 y.o. male is anew patient presenting for evaluation of abnormal urine protein electrophoresis.

## 2014-06-23 NOTE — Progress Notes (Signed)
Hematology/Oncology Consult    REASON FOR CONSULT: Abnormal serum protein electrophoresis  REQUESTED BY: Dr. Jake Michaelis    HISTORY OF PRESENT ILLNESS: Joe Little is a 63 y.o. male who presents for evaluation of abnormal serum protein electrophoresis.    This was discovered on his annual physical.  Urine test revealed proteinuria, then spot urine showed an M-spike.    No history of kidney, bone problems or hypercalcemia.    No fevers, chills, night sweats. Some intentional weight loss.  No new aches or pains.  No new lumps or bumps.  No CP/SOB/DOE.  No nausea, vomiting, diarrhea, or constipation.  No bleeding.    No blood disorders in the family.      Past Medical History   Diagnosis Date   ??? Depression    ??? 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 Actinic keratosis      nose   ??? HSV (herpes simplex virus) infection    ??? PUD (peptic ulcer disease) 1982       Past Surgical History   Procedure Laterality Date   ??? Hx tonsillectomy       age 51   ??? Hx vasectomy  1989   ??? Pr breast surgery procedure unlisted  1981     gynecomastia   ??? Endoscopy, colon, diagnostic  12/08     due in 10 years       No Known Allergies    Current Outpatient Prescriptions   Medication Sig Dispense Refill   ??? VARICELLA-ZOSTER VACINE LIVE 19,400 unit/0.65 mL susr injection   0   ??? diclofenac EC (VOLTAREN) 75 mg EC tablet Take 1 Tab by mouth two (2) times a day. 60 Tab 0   ??? acyclovir (ZOVIRAX) 400 mg tablet Take 1 Tab by mouth daily. Indications: SUPPRESSION OF RECURRENT HERPES SIMPLEX INFECTION 90 Tab 6   ??? CYANOCOBALAMIN, VITAMIN B-12, (VITAMIN B-12 PO) Take  by mouth.     ??? tamsulosin (FLOMAX) 0.4 mg capsule Take 1 capsule by mouth daily. 90 capsule 3   ??? ARIPiprazole (ABILIFY) 2 mg tablet Take 2 mg by mouth daily.     ??? clotrimazole (LOTRIMIN) 1 % topical cream Apply  to affected area two (2) times a day. 15 g 0   ??? buPROPion (WELLBUTRIN) 100 mg tablet Take 100 mg by mouth daily.        ??? therapeutic multivitamin (THERAGRAN) tablet Take 1 Tab by mouth daily.       ??? omega-3 fatty acids-vitamin e (FISH OIL) 1,000 mg Cap Take 1 Cap by mouth.       ??? duloxetine (CYMBALTA) 60 mg capsule Take 60 mg by mouth daily. Indications: MAJOR DEPRESSIVE DISORDER     ??? cyclobenzaprine (FLEXERIL) 5 mg tablet Take 1 Tab by mouth two (2) times a day. 60 Tab 0   ??? magic mouthwash solution Pharmacy to mix equal portions of ingredients to a total volume as indicated in the dispense amount. 120 mL 0   ??? cholecalciferol (VITAMIN D3) 1,000 unit tablet Take  by mouth daily.         History     Social History   ??? Marital Status: MARRIED     Spouse Name: [name]     Number of Children: 2   ??? 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 Smoking status: Never Smoker    ???  Smokeless tobacco: Never Used   ??? Alcohol Use: 0.5 oz/week     1 Cans of beer per week   ??? Drug Use: No   ??? Sexual Activity:     Partners: Female     Other Topics Concern   ??? Not on file     Social History Narrative       Family History   Problem Relation Age of Onset   ??? Alcohol abuse Mother    ??? Lung Disease Mother    ??? Alcohol abuse Father    ??? Cancer Sister    ??? Alcohol abuse Sister    ??? Asthma Sister    ??? Elevated Lipids Sister    ??? Hypertension Sister    ??? Stroke Maternal Grandfather    ??? Seizures Daughter      childhood only       ROS  As per the HPI, otherwise a comprehensive ROS is negative.    Physical Examination:   BP 124/78 mmHg   Pulse 89   Temp(Src) 98.9 ??F (37.2 ??C) (Oral)   Resp 18   Ht $R'5\' 11"'Hp$  (1.803 m)   Wt 170 lb (77.111 kg)   BMI 23.72 kg/m2   SpO2 97%  General appearance - alert, well appearing, and in no distress  Mental status - oriented to person, place, and time  Mouth - mucous membranes moist, pharynx normal without lesions  Neck - supple, no significant adenopathy  Lymphatics - no palpable lymphadenopathy, no hepatosplenomegaly   Chest - clear to auscultation, no wheezes, rales or rhonchi, symmetric air entry  Heart - normal rate, regular rhythm, normal S1, S2, no murmurs, rubs, clicks or gallops  Abdomen - soft, nontender, nondistended, no masses or organomegaly, bowel sounds present  Back exam - full range of motion, no tenderness, palpable spasm or pain on motion  Neurological - normal speech, no focal findings or movement disorder noted  Musculoskeletal - no joint tenderness, deformity or swelling  Extremities - peripheral pulses normal, no pedal edema, no clubbing or cyanosis  Skin - normal coloration and turgor, no rashes, no suspicious skin lesions noted    LABS  Lab Results   Component Value Date/Time    WBC 4.3 04/11/2014 10:18 AM    HGB 12.9 04/11/2014 10:18 AM    HCT 39.9 04/11/2014 10:18 AM    PLATELET 255 04/11/2014 10:18 AM    MCV 86 04/11/2014 10:18 AM    ABS. NEUTROPHILS 2.2 04/11/2014 10:18 AM     Lab Results   Component Value Date/Time    SODIUM 138 04/11/2014 10:18 AM    POTASSIUM 5.0 04/11/2014 10:18 AM    CHLORIDE 101 04/11/2014 10:18 AM    CO2 26 04/11/2014 10:18 AM    GLUCOSE 92 04/11/2014 10:18 AM    BUN 12 04/11/2014 10:18 AM    CREATININE 1.08 04/11/2014 10:18 AM    GFR EST AA 84 04/11/2014 10:18 AM    GFR EST NON-AA 73 04/11/2014 10:18 AM    CALCIUM 9.1 04/11/2014 10:18 AM     Lab Results   Component Value Date/Time    AST 17 04/11/2014 10:18 AM    ALK. PHOSPHATASE 61 04/11/2014 10:18 AM    PROTEIN, TOTAL 5.8 04/19/2014 10:16 AM    ALBUMIN 4.4 04/11/2014 10:18 AM    A-G RATIO 2.0 12/04/2012 12:23 PM       ASSESSMENT  Joe Little is a 63 y.o. male who presents for evaluation of M-spike in the urine.  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  However, on 24 hour urine collection, there was no M-spike.  Seems like the random urine test was a false positive. He has no other signs of myeloma and serum tests look good.  We discussed doing a bone  marrow to be absolutely sure.  We settled on repeating all labs in the new year and if any abnormalities, we'd do a marrow at that time.     2. Proteinuria.  This should be worked up the same as with any other patient with proteinuria.  There is no sign that this is related to a plasma cell disorder.    Joe Low. Lidie Glade, MD    Joe Little has a reminder for a "due or due soon" health maintenance. I have asked that he contact his primary care provider for follow-up on this health maintenance.

## 2014-06-27 LAB — GAMMOPATHY EVAL, SPEP/IFE, IG QT/FLC
A/G ratio: 1.9 (ref 0.7–2.0)
Albumin: 4 g/dL (ref 3.2–5.6)
Alpha-1-Globulin: 0.1 g/dL (ref 0.1–0.4)
Alpha-2-Globulin: 0.5 g/dL (ref 0.4–1.2)
Beta Globulin: 0.9 g/dL (ref 0.6–1.3)
Free Kappa Lt Chains, serum: 12.84 mg/L (ref 3.30–19.40)
Free Lambda Lt Chains, serum: 14.66 mg/L (ref 5.71–26.30)
Gamma Globulin: 0.7 g/dL (ref 0.5–1.6)
Globulin, total: 2.2 g/dL (ref 2.0–4.5)
Immunoglobulin A, Qt.: 213 mg/dL (ref 91–414)
Immunoglobulin G, Qt.: 790 mg/dL (ref 700–1600)
Immunoglobulin M, Qt.: <6 mg/dL — ABNORMAL LOW (ref 40–230)
Kappa/Lambda ratio, serum: 0.88 (ref 0.26–1.65)
Protein, total: 6.2 g/dL (ref 6.0–8.5)

## 2014-06-27 NOTE — Progress Notes (Signed)
Quick Note:        Nursing pool - Please let him show that there is no sign of an abnormal protein. However, he does have low levels of one of the immuneglobulins. This can indicate an immune deficiency. He should see an immunologist. Please offer him referral.    Dr. Jake Michaelis - FYI. Low IgM.    ______

## 2014-06-30 NOTE — Progress Notes (Signed)
Quick Note:        Spoke with pt and reviewed lab results. Pt is interested in a referral to an Immunologist. Will find a provider and facilitate this referral and notify pt.    ______

## 2014-07-04 NOTE — Progress Notes (Signed)
Quick Note:        Immunology appt scheduled with Dr. Dwyane Dee at [place] at [facility], [address] [place] [address] for 08/15/14 at 0900 (first available). Records faxed to [phone].    ______

## 2014-07-04 NOTE — Progress Notes (Signed)
Quick Note:        Pt aware of appt info. He may call to reschedule to a more convenient date and time.    ______

## 2014-07-13 NOTE — Telephone Encounter (Signed)
-----   Message from Patrici Ranks sent at 07/13/2014  9:25 AM EDT -----  Regarding: Kapoor/Rx  Pt is requesting a Rx for "Tamsulosin"called to [facility].Pt's number is [phone] and [facility] phone number is [phone].

## 2014-07-15 MED ORDER — TAMSULOSIN SR 0.4 MG 24 HR CAP
0.4 mg | ORAL_CAPSULE | Freq: Every day | ORAL | Status: DC
Start: 2014-07-15 — End: 2015-04-13

## 2014-08-23 ENCOUNTER — Encounter

## 2014-10-09 ENCOUNTER — Ambulatory Visit
Admit: 2014-10-09 | Discharge: 2014-10-10 | Payer: PRIVATE HEALTH INSURANCE | Attending: Family Medicine | Primary: Family Medicine

## 2014-10-09 DIAGNOSIS — Z0000 Encounter for general adult medical examination without abnormal findings: Secondary | ICD-10-CM

## 2014-10-09 MED ORDER — PNEUMOCOCCAL 13-VAL CONJ VACCINE-DIP CRM (PF) 0.5 ML IM SYRINGE
0.5 mL | Freq: Once | INTRAMUSCULAR | Status: AC
Start: 2014-10-09 — End: 2014-10-09

## 2014-10-09 NOTE — Progress Notes (Signed)
B

## 2014-10-11 NOTE — Telephone Encounter (Signed)
Patient's #: [phone]    Patient called stating that he was recently prescribed with a pneumonia injection and was told to check with his immunologist to make sure it was the correct vaccine. Says that the correct name for the vaccine is [id number]. Would like to have this sent through for him.

## 2014-10-12 MED ORDER — PNEUMOCOCCAL 23-VALPS VACCINE 25 MCG/0.5 ML INJECTION
25 mcg/0.5 mL | Freq: Once | INTRAMUSCULAR | Status: AC
Start: 2014-10-12 — End: 2014-10-12

## 2014-10-12 NOTE — Telephone Encounter (Signed)
Printed

## 2014-10-16 NOTE — Telephone Encounter (Signed)
-----   Message from [place] sent at 10/16/2014 10:22 AM EST -----  Regarding: Dr. Eino Farber   Pt said Dr. Jake Michaelis gave him a prescription for a pneumonia vaccine, but was not the one requested by the immunologist. The prescription should be written for "PPSV23". Pt pharmacy is  [facility]- [phone]. Best contact number for pt is [phone].

## 2014-10-16 NOTE — Telephone Encounter (Signed)
placed up front for pick up

## 2014-10-24 ENCOUNTER — Encounter

## 2014-10-31 NOTE — Telephone Encounter (Signed)
CAll to patient re:  Labs ordered in October.  HIPAA verified.  Patient stated would have lab done in March at [facility].  Orders mailed to patient.  Thanked for call.

## 2014-11-15 NOTE — Telephone Encounter (Signed)
Contact # is [phone]      Patient is requesting a refill on "Acyclovir". Medication reordered. Pharmacy verified.

## 2014-11-17 MED ORDER — ACYCLOVIR 400 MG TAB
400 mg | ORAL_TABLET | Freq: Every day | ORAL | Status: AC
Start: 2014-11-17 — End: ?

## 2015-02-19 NOTE — Telephone Encounter (Signed)
MaryAnne informed ok to try to run a protein electrophoresis on random urine per NP Lucrezia Europe. MaryAnne states understanding.

## 2015-02-19 NOTE — Telephone Encounter (Signed)
Joe Little from [facility] Lab Department would like a call back from the nurse to discuss lab orders that were faxed to them. She can be reached at [phone]

## 2015-02-19 NOTE — Telephone Encounter (Signed)
Contacted Joe Little at the [facility] Pathology lab. Patient had his labs drawn that were ordered on 06/23/14 by Dr. Donivan Scull. The patient did not collect a 24 hour urine, Joe Little is asking if she should still run a protein electrophoresis on a random urine. Please advise.

## 2015-02-22 NOTE — Telephone Encounter (Signed)
Call from [place] path.  They have run requested report for gammopathy eval, protein electrophoresis.  CBC diff and CMP completed.  Resulted 02/20/15 and they are faxing results.  Light chains, ratios and quantitative free ratios not completed.  Please review when rec'd to assure we have what we need.  If additional items needed we will need to redraw.   He stated initial request was electronically rec'd.

## 2015-02-23 NOTE — Telephone Encounter (Signed)
Call from June Lee.  He wants to assure

## 2015-04-13 ENCOUNTER — Ambulatory Visit
Admit: 2015-04-13 | Discharge: 2015-04-13 | Payer: PRIVATE HEALTH INSURANCE | Attending: Family Medicine | Primary: Family Medicine

## 2015-04-13 DIAGNOSIS — Z0000 Encounter for general adult medical examination without abnormal findings: Secondary | ICD-10-CM

## 2015-04-13 LAB — AMB POC URINALYSIS DIP STICK MANUAL W/O MICRO
Bilirubin (UA POC): NEGATIVE
Blood (UA POC): NEGATIVE
Glucose (UA POC): NEGATIVE
Ketones (UA POC): NEGATIVE
Leukocyte esterase (UA POC): NEGATIVE
Nitrites (UA POC): NEGATIVE
Protein (UA POC): NEGATIVE mg/dL
Specific gravity (UA POC): 1.015 (ref 1.001–1.035)
Urobilinogen (UA POC): 0.2 (ref 0.2–1)
pH (UA POC): 7 (ref 4.6–8.0)

## 2015-04-13 MED ORDER — TAMSULOSIN SR 0.4 MG 24 HR CAP
0.4 mg | ORAL_CAPSULE | Freq: Two times a day (BID) | ORAL | Status: DC
Start: 2015-04-13 — End: 2015-06-12

## 2015-04-13 MED ORDER — PNEUMOCOCCAL 13-VAL CONJ VACCINE-DIP CRM (PF) 0.5 ML IM SYRINGE
0.5 mL | Freq: Once | INTRAMUSCULAR | Status: AC
Start: 2015-04-13 — End: 2015-04-13

## 2015-04-13 NOTE — Progress Notes (Signed)
All health maintenance and other pertinent information has been reviewed in preparation for today's office visit. Patient presents in the office today for:    Chief Complaint   Patient presents with   ??? Complete Physical     Fasting     1. Have you been to the ER, urgent care clinic since your last visit?  Hospitalized since your last visit?No    2. Have you seen or consulted any other health care providers outside of the [facility] since your last visit?  Include any pap smears or colon screening. No

## 2015-04-13 NOTE — Progress Notes (Signed)
This note will not be viewable in [place].      PROBLEM LIST  1. Physical exam    2. 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 Screen for colon cancer    5. Proteinuria        MEDICATION LIST  Current Outpatient Prescriptions on File Prior to Visit   Medication Sig Dispense Refill   ??? acyclovir (ZOVIRAX) 400 mg tablet Take 1 Tab by mouth daily. Indications: SUPPRESSION OF RECURRENT HERPES SIMPLEX INFECTION 90 Tab 6   ??? CYANOCOBALAMIN, VITAMIN B-12, (VITAMIN B-12 PO) Take  by mouth.     ??? ARIPiprazole (ABILIFY) 2 mg tablet Take 2 mg by mouth daily.     ??? cholecalciferol (VITAMIN D3) 1,000 unit tablet Take  by mouth daily.     ??? buPROPion (WELLBUTRIN) 100 mg tablet Take 100 mg by mouth daily.       ??? therapeutic multivitamin (THERAGRAN) tablet Take 1 Tab by mouth daily.       ??? omega-3 fatty acids-vitamin e (FISH OIL) 1,000 mg Cap Take 1 Cap by mouth.       ??? duloxetine (CYMBALTA) 60 mg capsule Take 60 mg by mouth daily. Indications: MAJOR DEPRESSIVE DISORDER     ??? VARICELLA-ZOSTER VACINE LIVE 19,400 unit/0.65 mL susr injection   0   ??? diclofenac EC (VOLTAREN) 75 mg EC tablet Take 1 Tab by mouth two (2) times a day. 60 Tab 0   ??? cyclobenzaprine (FLEXERIL) 5 mg tablet Take 1 Tab by mouth two (2) times a day. 60 Tab 0   ??? magic mouthwash solution Pharmacy to mix equal portions of ingredients to a total volume as indicated in the dispense amount. 120 mL 0   ??? clotrimazole (LOTRIMIN) 1 % topical cream Apply  to affected area two (2) times a day. 15 g 0     No current facility-administered medications on file prior to visit.       CHIEF COMPLAINT   Chief Complaint   Patient presents with   ??? Complete Physical     Fasting           HPI  Joe Little is a 64 y.o. presenting for  Complete Physical      Patient is here for evaluation    Patient is here for (The primary encounter diagnosis was Physical exam. 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     PAST MEDICAL HISTORY  Past Medical History   Diagnosis Date   ??? Depression    ??? 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 Actinic keratosis      nose   ??? HSV (herpes simplex virus) infection    ??? PUD (peptic ulcer disease) 1982       PAST SURGICAL HISTORY   has past surgical history that includes tonsillectomy; vasectomy (1989); breast surgery procedure unlisted (1981); and endoscopy, colon, diagnostic (12/08).    ALLERGIES  No Known Allergies      FAMILY HISTORY/ SOCIAL HISTORY  History     Social History   ??? Marital Status: MARRIED     Spouse Name: Joe Little   ??? Number of Children: 2   ??? 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 Smoking status: Never Smoker    ???  Smokeless tobacco: Never Used   ??? Alcohol Use: 0.5 oz/week     1 Cans of beer per week   ??? Drug Use: No   ??? Sexual Activity:     Partners: Female     Other Topics Concern   ??? Not on file     Social History Narrative    Lives with his wife.          ROS  Constitutional: No night sweats   ENT/Mouth:  No epistaxis     Cardiovascular:   no chest pain, no SOB   Respiratory: No hemoptysis, No stridor   Gastrointestinal: No hematemesis   Genitourinary: No blood in urine   Musculoskeletal:   No joint redness       Neurological:  No recent seizures, no new focal weakness   Psychiatric:   No hallucinations, paranoia or suspiciousness.   Endocrine: No heat or cold intolerance, No polydipsia   Hematologic/Lymphatic:    Eye:  Skin: No bleeding/bruising tendency    No pain   No change in color or size of any moles     Rest of the ROS per HPI    Any X rays if ordered were personally reviewed.   Old records were reviewed.  Old lab tests were reviewed.  Old radiology reports were reviewed.  Old medicine tests were reviewed.    PHYSICAL EXAMINATION    Visit Vitals   Item Reading   ??? BP 105/65 mmHg   ??? Pulse 74   ??? Temp(Src) 98.4 ??F (36.9 ??C) (Oral)    ??? Resp 16   ??? Ht 5\' 11"  (1.803 m)   ??? Wt 169 lb 6.4 oz (76.839 kg)   ??? BMI 23.64 kg/m2   ??? SpO2 97%       GEN   AAO x 3, NAD, mood and affect are normal  HEENT  PERRL EOMI no conjunctival pallor  NECK   Supple, no crepitus, trachea in midline     No thyromegaly     Carotid pulse good  RS   Clear to auscultation bilaterally, No crackles, wheezes , No rales     No hyperesonance        No respiratory distress or use of accessory muscles     No axillary lymphadenopathy  CVS   RRR No murmurs, gallops or rubs  ABDOMEN             Not tender NABS     No rebound/ guarding or rigidity     No hepatomegaly No splenomegaly   No masses, femoral pulse good  No abdominal bruit  No groin lymphadenopathy  EXT   No CCE, knee reflexes 2+ bilaterally      ASSESSMENT/ PLAN    1. Physical exam    2. Other depression    3. 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 Screen for colon cancer    5. Proteinuria        1. EXAM AS ABOVE BLOOD TESTS   2. BETTER CPM NO SI OR HI   3. FU WITH [facility] IMMUNOLOGY ASAP   4. REFER TO GI   5. FU WITH HEMATOLOGY AND NEPHROLOGY   6.    7.    8.    9.    10.    11.    12.      INCREASE FLOMAX TO BID    Orders Placed This Encounter   ??? CBC WITH AUTOMATED DIFF   ??? METABOLIC  PANEL, BASIC   ??? LIPID PANEL   ??? TSH 3RD GENERATION   ??? HEPATIC FUNCTION PANEL   ??? VITAMIN D, 25 HYDROXY   ??? HEMOGLOBIN A1C WITH EAG   ??? PROSTATE SPECIFIC AG   ??? REFERRAL TO GASTROENTEROLOGY     Referral Priority:  Routine     Referral Type:  Consultation     Referral Reason:  Specialty Services Required     Referral Location:  [facility]     Referred to Provider:  Lindaann Slough, MD   ??? AMB POC URINALYSIS DIP STICK MANUAL W/O MICRO   ??? AMB POC EKG ROUTINE W/ 12 LEADS, INTER & REP     Order Specific Question:  Reason for Exam:     Answer:  physical exam   ??? PR COLLECTION VENOUS BLOOD,VENIPUNCTURE   ??? PR HANDLG&/OR CONVEY OF SPEC FOR TR OFFICE TO LAB   ??? tamsulosin (FLOMAX) 0.4 mg capsule      Sig: Take 1 Cap by mouth two (2) times a day.     Dispense:  180 Cap     Refill:  1   ??? pneumococcal 13 val conj dip (PREVNAR-13) 0.5 mL syrg injection     Sig: 0.5 mL by IntraMUSCular route once for 1 dose.     Dispense:  0.5 mL     Refill:  0           CPM= Continue present management    Deckard was seen today for complete physical.    Diagnoses and all orders for this visit:    Physical exam  Orders:  -     AMB POC URINALYSIS DIP STICK MANUAL W/O MICRO  -     AMB POC EKG ROUTINE W/ 12 LEADS, INTER & REP  -     tamsulosin (FLOMAX) 0.4 mg capsule; Take 1 Cap by mouth two (2) times a day.  -     PR COLLECTION VENOUS BLOOD,VENIPUNCTURE  -     PR HANDLG&/OR CONVEY OF SPEC FOR TR OFFICE TO LAB  -     CBC WITH AUTOMATED DIFF  -     METABOLIC PANEL, BASIC  -     LIPID PANEL  -     TSH 3RD GENERATION  -     HEPATIC FUNCTION PANEL  -     VITAMIN D, 25 HYDROXY  -     HEMOGLOBIN A1C WITH EAG  -     PROSTATE SPECIFIC AG    Other depression  Orders:  -     tamsulosin (FLOMAX) 0.4 mg capsule; Take 1 Cap by mouth two (2) times a day.  -     PR COLLECTION VENOUS BLOOD,VENIPUNCTURE  -     PR HANDLG&/OR CONVEY OF SPEC FOR TR OFFICE TO LAB  -     CBC WITH AUTOMATED DIFF  -     METABOLIC PANEL, BASIC  -     LIPID PANEL  -     TSH 3RD GENERATION  -     HEPATIC FUNCTION PANEL  -     VITAMIN D, 25 HYDROXY  -     HEMOGLOBIN A1C WITH EAG  -     PROSTATE SPECIFIC AG    Decreased immunoglobulin M ([facility])  Orders:  -     tamsulosin (FLOMAX) 0.4 mg capsule; Take 1 Cap by mouth two (2) times a day.  -     PR COLLECTION VENOUS BLOOD,VENIPUNCTURE  -  PR HANDLG&/OR CONVEY OF SPEC FOR TR OFFICE TO LAB  -     CBC WITH AUTOMATED DIFF  -     METABOLIC PANEL, BASIC  -     LIPID PANEL  -     TSH 3RD GENERATION  -     HEPATIC FUNCTION PANEL  -     VITAMIN D, 25 HYDROXY  -     HEMOGLOBIN A1C WITH EAG  -     PROSTATE SPECIFIC AG    Screen for colon cancer  Orders:  -     tamsulosin (FLOMAX) 0.4 mg capsule; Take 1 Cap by mouth two (2) times a day.   -     PR COLLECTION VENOUS BLOOD,VENIPUNCTURE  -     PR HANDLG&/OR CONVEY OF SPEC FOR TR OFFICE TO LAB  -     CBC WITH AUTOMATED DIFF  -     METABOLIC PANEL, BASIC  -     LIPID PANEL  -     TSH 3RD GENERATION  -     HEPATIC FUNCTION PANEL  -     VITAMIN D, 25 HYDROXY  -     HEMOGLOBIN A1C WITH EAG  -     PROSTATE SPECIFIC AG  -     REFERRAL TO GASTROENTEROLOGY    Proteinuria  Orders:  -     tamsulosin (FLOMAX) 0.4 mg capsule; Take 1 Cap by mouth two (2) times a day.  -     PR COLLECTION VENOUS BLOOD,VENIPUNCTURE  -     PR HANDLG&/OR CONVEY OF SPEC FOR TR OFFICE TO LAB  -     CBC WITH AUTOMATED DIFF  -     METABOLIC PANEL, BASIC  -     LIPID PANEL  -     TSH 3RD GENERATION  -     HEPATIC FUNCTION PANEL  -     VITAMIN D, 25 HYDROXY  -     HEMOGLOBIN A1C WITH EAG  -     PROSTATE SPECIFIC AG    Other orders  -     pneumococcal 13 val conj dip (PREVNAR-13) 0.5 mL syrg injection; 0.5 mL by IntraMUSCular route once for 1 dose.      Age appropriate preventive screening/tests discussed and recommended    Patient/family were counseled in detail on diet and exercise.    More than 55 minutes spent with patient of which more than half was face to face counseling and coordination of care.     Counseling included discussion of diagnosis, differentials, treatment options, prescribed treatment, warning signs and follow up. Medication risks/benefits/costs/interactions/alternatives discussed with patient.     Patient agreed and verbalized understanding  Patient education was done  Possible side effects of all medications were explained to the patient and the patient expressed desire to try the medications despite explanation of the risks  Advised to go to ER immediately if symptoms worsen  f/u with PCP in a week

## 2015-04-14 LAB — CBC WITH AUTOMATED DIFF
ABS. BASOPHILS: 0 10*3/uL (ref 0.0–0.2)
ABS. EOSINOPHILS: 0.1 10*3/uL (ref 0.0–0.4)
ABS. IMM. GRANS.: 0 10*3/uL (ref 0.0–0.1)
ABS. MONOCYTES: 0.5 10*3/uL (ref 0.1–0.9)
ABS. NEUTROPHILS: 2.1 10*3/uL (ref 1.4–7.0)
Abs Lymphocytes: 1.3 10*3/uL (ref 0.7–3.1)
BASOPHILS: 1 %
EOSINOPHILS: 2 %
HCT: 38.2 % (ref 37.5–51.0)
HGB: 12.2 g/dL — ABNORMAL LOW (ref 12.6–17.7)
IMMATURE GRANULOCYTES: 0 %
Lymphocytes: 32 %
MCH: 27.5 pg (ref 26.6–33.0)
MCHC: 31.9 g/dL (ref 31.5–35.7)
MCV: 86 fL (ref 79–97)
MONOCYTES: 12 %
NEUTROPHILS: 53 %
PLATELET: 254 10*3/uL (ref 150–379)
RBC: 4.44 x10E6/uL (ref 4.14–5.80)
RDW: 14.3 % (ref 12.3–15.4)
WBC: 3.9 10*3/uL (ref 3.4–10.8)

## 2015-04-14 LAB — LIPID PANEL
Cholesterol, total: 160 mg/dL (ref 100–199)
HDL Cholesterol: 54 mg/dL (ref >39)
LDL, calculated: 96 mg/dL (ref 0–99)
Triglyceride: 51 mg/dL (ref 0–149)
VLDL, calculated: 10 mg/dL (ref 5–40)

## 2015-04-14 LAB — METABOLIC PANEL, BASIC
BUN/Creatinine ratio: 12 (ref 10–22)
BUN: 13 mg/dL (ref 8–27)
CO2: 26 mmol/L (ref 18–29)
Calcium: 9.3 mg/dL (ref 8.6–10.2)
Chloride: 103 mmol/L (ref 97–108)
Creatinine: 1.09 mg/dL (ref 0.76–1.27)
GFR est AA: 82 mL/min/{1.73_m2} (ref >59)
GFR est non-AA: 71 mL/min/{1.73_m2} (ref >59)
Glucose: 89 mg/dL (ref 65–99)
Potassium: 5.9 mmol/L — ABNORMAL HIGH (ref 3.5–5.2)
Sodium: 140 mmol/L (ref 134–144)

## 2015-04-14 LAB — HEPATIC FUNCTION PANEL
ALT (SGPT): 13 IU/L (ref 0–44)
AST (SGOT): 13 IU/L (ref 0–40)
Albumin: 4.2 g/dL (ref 3.6–4.8)
Alk. phosphatase: 54 IU/L (ref 39–117)
Bilirubin, direct: 0.1 mg/dL (ref 0.00–0.40)
Bilirubin, total: 0.3 mg/dL (ref 0.0–1.2)
Protein, total: 6.3 g/dL (ref 6.0–8.5)

## 2015-04-14 LAB — TSH 3RD GENERATION: TSH: 0.879 u[IU]/mL (ref 0.450–4.500)

## 2015-04-14 LAB — HEMOGLOBIN A1C WITH EAG
Estimated average glucose: 120 mg/dL
Hemoglobin A1c: 5.8 % — ABNORMAL HIGH (ref 4.8–5.6)

## 2015-04-14 LAB — CVD REPORT

## 2015-04-14 LAB — VITAMIN D, 25 HYDROXY: VITAMIN D, 25-HYDROXY: 32.4 ng/mL (ref 30.0–100.0)

## 2015-04-14 LAB — PSA, DIAGNOSTIC (PROSTATE SPECIFIC AG): Prostate Specific Ag: 0.5 ng/mL (ref 0.0–4.0)

## 2015-04-18 NOTE — Progress Notes (Signed)
Quick Note:        f/u appointment asap abnormal labs    ______

## 2015-04-26 NOTE — Telephone Encounter (Signed)
Need results for Lab Work from last visit-   Please call and advise-               [place], [place]   [phone]

## 2015-04-27 NOTE — Progress Notes (Signed)
Pt has f/u appt  Wednesday, May 09, 2015 08:30 AM

## 2015-05-09 ENCOUNTER — Ambulatory Visit: Admit: 2015-05-09 | Payer: PRIVATE HEALTH INSURANCE | Attending: Family Medicine | Primary: Family Medicine

## 2015-05-09 DIAGNOSIS — D649 Anemia, unspecified: Secondary | ICD-10-CM

## 2015-05-09 NOTE — Progress Notes (Signed)
This note will not be viewable in [facility].      PROBLEM LIST  1. Anemia, unspecified type    2. Hyperkalemia    3. Pre-diabetes    4. 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 Decreased immunoglobulin M Joe Little)        MEDICATION LIST  Current Outpatient Prescriptions on File Prior to Visit   Medication Sig Dispense Refill   ??? tamsulosin (FLOMAX) 0.4 mg capsule Take 1 Cap by mouth two (2) times a day. 180 Cap 1   ??? acyclovir (ZOVIRAX) 400 mg tablet Take 1 Tab by mouth daily. Indications: SUPPRESSION OF RECURRENT HERPES SIMPLEX INFECTION 90 Tab 6   ??? CYANOCOBALAMIN, VITAMIN B-12, (VITAMIN B-12 PO) Take  by mouth.     ??? ARIPiprazole (ABILIFY) 2 mg tablet Take 2 mg by mouth daily.     ??? buPROPion (WELLBUTRIN) 100 mg tablet Take 100 mg by mouth daily.       ??? therapeutic multivitamin (THERAGRAN) tablet Take 1 Tab by mouth daily.       ??? omega-3 fatty acids-vitamin e (FISH OIL) 1,000 mg Cap Take 1 Cap by mouth.       ??? duloxetine (CYMBALTA) 60 mg capsule Take 60 mg by mouth daily. Indications: MAJOR DEPRESSIVE DISORDER     ??? diclofenac EC (VOLTAREN) 75 mg EC tablet Take 1 Tab by mouth two (2) times a day. 60 Tab 0   ??? cyclobenzaprine (FLEXERIL) 5 mg tablet Take 1 Tab by mouth two (2) times a day. 60 Tab 0   ??? magic mouthwash solution Pharmacy to mix equal portions of ingredients to a total volume as indicated in the dispense amount. 120 mL 0   ??? clotrimazole (LOTRIMIN) 1 % topical cream Apply  to affected area two (2) times a day. 15 g 0   ??? cholecalciferol (VITAMIN D3) 1,000 unit tablet Take  by mouth daily.       No current facility-administered medications on file prior to visit.        CHIEF COMPLAINT   Chief Complaint   Patient presents with   ??? Abnormal Lab Results           HPI  Joe Little is a 64 y.o. presenting for  Abnormal Lab Results      Patient is here for evaluation    Patient is here for (The primary encounter diagnosis was Anemia,  unspecified type. 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     PAST MEDICAL HISTORY  Past Medical History   Diagnosis Date   ??? Actinic keratosis      nose   ??? Depression    ??? HSV (herpes simplex virus) infection    ??? 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 PUD (peptic ulcer disease) 1982       PAST SURGICAL HISTORY   has a past surgical history that includes tonsillectomy; vasectomy (1989); breast surgery procedure unlisted (1981); and endoscopy, colon, diagnostic (12/08).    ALLERGIES  No Known Allergies      FAMILY HISTORY/ SOCIAL HISTORY  Social History     Social History   ??? Marital status: MARRIED     Spouse name: Joe Little   ??? Number of children: 2   ??? 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 Smoking status: Never Smoker   ??? Smokeless tobacco: Never Used   ??? Alcohol use 0.5 oz/week     1 Cans of beer per week   ??? Drug use: No   ??? Sexual activity: Yes     Partners: Female     Other Topics Concern   ??? Not on file     Social History Narrative    Lives with his wife.          ROS  Constitutional: No night sweats   ENT/Mouth:  No epistaxis     Cardiovascular:   no chest pain, no SOB   Respiratory: No hemoptysis, No stridor   Gastrointestinal: No hematemesis   Genitourinary: No blood in urine   Musculoskeletal:   No joint redness       Neurological:  No recent seizures, no new focal weakness   Psychiatric:   No hallucinations, paranoia or suspiciousness.   Endocrine: No heat or cold intolerance, No polydipsia   Hematologic/Lymphatic:    Eye:  Skin: No bleeding/bruising tendency    No pain   No change in color or size of any moles     Rest of the ROS per HPI    Any X rays if ordered were personally reviewed.   Old records were reviewed.  Old lab tests were reviewed.  Old radiology reports were reviewed.   Old medicine tests were reviewed.    PHYSICAL EXAMINATION    Visit Vitals   ??? BP 108/58 (BP 1 Location: Right arm, BP Patient Position: Sitting)   ??? Pulse 76   ??? Temp 97.6 ??F (36.4 ??C) (Oral)   ??? Resp 16   ??? Ht 5\' 11"  (1.803 m)   ??? Wt 171 lb (77.6 kg)   ??? SpO2 98%   ??? BMI 23.85 kg/m2       GEN   AAO x 3, NAD, mood and affect are normal  HEENT  PERRL EOMI no conjunctival pallor  NECK   Supple, no crepitus, trachea in midline     No thyromegaly     Carotid pulse good  RS   Clear to auscultation bilaterally, No crackles, wheezes , No rales     No hyperesonance        No respiratory distress or use of accessory muscles     No axillary lymphadenopathy  CVS   RRR No murmurs, gallops or rubs  ABDOMEN             Not tender NABS     No rebound/ guarding or rigidity     No hepatomegaly No splenomegaly   No masses, femoral pulse good  No abdominal bruit  No groin lymphadenopathy  EXT   No CCE, knee reflexes 2+ bilaterally      ASSESSMENT/ PLAN    1. Anemia, unspecified type    2. Hyperkalemia    3. Pre-diabetes    4. 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 Decreased immunoglobulin M (Joe Little)        1. Blood tests  GET COLONOSCOPY ASAP   2. REPEAT BLOOD TESTS   3. WATCH DIET EXERCISE   4. BETTER CPM   5. FU WITH IMMUNOLOGY ASAP   6.    7.    8.    9.    10.    11.    12.  Orders Placed This Encounter   ??? CBC WITH AUTOMATED DIFF   ??? IRON PROFILE   ??? FERRITIN   ??? VITAMIN B12 & FOLATE   ??? HEMOGLOBIN FRACTIONATION   ??? PATHOLOGIST REVIEW SMEARS   ??? METABOLIC PANEL, BASIC           CPM= Continue present management    Joe Little was seen today for abnormal lab results.    Diagnoses and all orders for this visit:    Anemia, unspecified type  -     CBC WITH AUTOMATED DIFF  -     IRON PROFILE  -     FERRITIN  -     VITAMIN B12 & FOLATE  -     HEMOGLOBIN FRACTIONATION  -     PATHOLOGIST REVIEW SMEARS  -     METABOLIC PANEL, BASIC    Hyperkalemia  -     CBC WITH AUTOMATED DIFF   -     IRON PROFILE  -     FERRITIN  -     VITAMIN B12 & FOLATE  -     HEMOGLOBIN FRACTIONATION  -     PATHOLOGIST REVIEW SMEARS  -     METABOLIC PANEL, BASIC    Pre-diabetes  -     CBC WITH AUTOMATED DIFF  -     IRON PROFILE  -     FERRITIN  -     VITAMIN B12 & FOLATE  -     HEMOGLOBIN FRACTIONATION  -     PATHOLOGIST REVIEW SMEARS  -     METABOLIC PANEL, BASIC    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    More than 25 minutes spent with patient of which more than half was face to face counseling and coordination of care.     Counseling included discussion of diagnosis, differentials, treatment options, prescribed treatment, warning signs and follow up. Medication risks/benefits/costs/interactions/alternatives discussed with patient.     Patient agreed and verbalized understanding  Patient education was done  Possible side effects of all medications were explained to the patient and the patient expressed desire to try the medications despite explanation of the risks  Advised to go to ER immediately if symptoms worsen  f/u with PCP in a week

## 2015-05-09 NOTE — Progress Notes (Signed)
Chief Complaint   Patient presents with   ??? Abnormal Lab Results       Reviewed Record in preparation for visit and have obtained necessary documentation.     Identified pt with two pt identifiers (Name @ DOB)    Health Maintenance Due   Topic   ??? INFLUENZA AGE 64 TO ADULT          1. Have you been to the ER, urgent care clinic since your last visit?  Hospitalized since your last visit?No    2. Have you seen or consulted any other health care providers outside of the [facility] since your last visit?  Include any pap smears or colon screening. No

## 2015-05-10 LAB — CBC WITH AUTOMATED DIFF
ABS. BASOPHILS: 0 10*3/uL (ref 0.0–0.2)
ABS. EOSINOPHILS: 0.1 10*3/uL (ref 0.0–0.4)
ABS. IMM. GRANS.: 0 10*3/uL (ref 0.0–0.1)
ABS. MONOCYTES: 0.3 10*3/uL (ref 0.1–0.9)
ABS. NEUTROPHILS: 2.3 10*3/uL (ref 1.4–7.0)
Abs Lymphocytes: 1.1 10*3/uL (ref 0.7–3.1)
BASOPHILS: 1 %
EOSINOPHILS: 2 %
HCT: 38.7 % (ref 37.5–51.0)
HGB: 12.5 g/dL — ABNORMAL LOW (ref 12.6–17.7)
IMMATURE GRANULOCYTES: 0 %
Lymphocytes: 29 %
MCH: 27.2 pg (ref 26.6–33.0)
MCHC: 32.3 g/dL (ref 31.5–35.7)
MCV: 84 fL (ref 79–97)
MONOCYTES: 8 %
NEUTROPHILS: 60 %
PLATELET: 271 10*3/uL (ref 150–379)
RBC: 4.6 x10E6/uL (ref 4.14–5.80)
RDW: 14.5 % (ref 12.3–15.4)
WBC: 3.8 10*3/uL (ref 3.4–10.8)

## 2015-05-10 LAB — HEMOGLOBIN FRACTIONATION
HEMOGLOBIN A2: 2.1 % (ref 0.7–3.1)
HEMOGLOBIN F: 0 % (ref 0.0–2.0)
HEMOGLOBIN S: 0 %
HGB A: 97.9 % (ref 94.0–98.0)
HGB SOLUBILITY: NEGATIVE
Hemoglobin A2: 2.1 % (ref 0.7–3.1)
Hemoglobin A: 97.9 % (ref 94.0–98.0)
Hemoglobin C: 0 %
Hemoglobin C: 0 %
Hemoglobin F: 0 % (ref 0.0–2.0)
Hemoglobin S: 0 %
Hgb Solubility: NEGATIVE

## 2015-05-10 LAB — METABOLIC PANEL, BASIC
BUN/Creatinine ratio: 16 (ref 10–22)
BUN: 16 mg/dL (ref 8–27)
CO2: 24 mmol/L (ref 18–29)
Calcium: 9 mg/dL (ref 8.6–10.2)
Chloride: 101 mmol/L (ref 97–108)
Creatinine: 1.02 mg/dL (ref 0.76–1.27)
GFR est AA: 89 mL/min/{1.73_m2} (ref >59)
GFR est non-AA: 77 mL/min/{1.73_m2} (ref >59)
Glucose: 129 mg/dL — ABNORMAL HIGH (ref 65–99)
Potassium: 4.8 mmol/L (ref 3.5–5.2)
Sodium: 141 mmol/L (ref 134–144)

## 2015-05-10 LAB — PATHOLOGIST REVIEW SMEARS
Platelets: NORMAL
RBC: NORMAL
WBC: NORMAL

## 2015-05-10 LAB — VITAMIN B12 & FOLATE
Folate: >20 ng/mL (ref >3.0)
Vitamin B12: 745 pg/mL (ref 211–946)

## 2015-05-10 LAB — IRON PROFILE
Iron % saturation: 13 % — ABNORMAL LOW (ref 15–55)
Iron: 54 ug/dL (ref 38–169)
TIBC: 413 ug/dL (ref 250–450)
UIBC: 359 ug/dL — ABNORMAL HIGH (ref 111–343)

## 2015-05-10 LAB — FERRITIN: Ferritin: 9 ng/mL — ABNORMAL LOW (ref 30–400)

## 2015-05-10 NOTE — Progress Notes (Signed)
Iron wnl but ferritin pretty low.  Not sure what is causing this  Would f/u with Dr Donivan Scull for further recommendations

## 2015-05-22 NOTE — Progress Notes (Signed)
Patient was called and notified of note below. Patient was given number to [facility] office and he prefers to contact to make the appointment. Referral will be ordered.

## 2015-05-22 NOTE — Telephone Encounter (Signed)
Patient was called and notified of most lab results and recommendations. Referral will be placed and pended.

## 2015-05-22 NOTE — Addendum Note (Signed)
Addended by: Glean Salen on: 05/22/2015 09:44 AM      Modules accepted: Orders

## 2015-06-12 ENCOUNTER — Encounter

## 2015-06-12 NOTE — Telephone Encounter (Signed)
Pharmacy on file verified   *patient is requesting a 30 day RX be sent to the pharmacy on file and a 90 day RX be written and mailed to him so that he can send it to a mail order pharmacy.     Best call back # for patient is: [phone]

## 2015-06-13 MED ORDER — TAMSULOSIN SR 0.4 MG 24 HR CAP
0.4 mg | ORAL_CAPSULE | Freq: Every day | ORAL | 0 refills | Status: AC
Start: 2015-06-13 — End: ?

## 2015-06-13 MED ORDER — TAMSULOSIN SR 0.4 MG 24 HR CAP
0.4 mg | ORAL_CAPSULE | Freq: Two times a day (BID) | ORAL | 0 refills | Status: AC
Start: 2015-06-13 — End: ?

## 2015-08-03 ENCOUNTER — Ambulatory Visit
Admit: 2015-08-03 | Discharge: 2015-08-03 | Payer: PRIVATE HEALTH INSURANCE | Attending: Internal Medicine | Primary: Family Medicine

## 2015-08-03 DIAGNOSIS — D509 Iron deficiency anemia, unspecified: Secondary | ICD-10-CM

## 2015-08-03 MED ORDER — FERROUS SULFATE 325 MG (65 MG ELEMENTAL IRON) TAB
325 mg (65 mg iron) | ORAL_TABLET | Freq: Three times a day (TID) | ORAL | 1 refills | Status: DC
Start: 2015-08-03 — End: 2015-12-24

## 2015-08-03 NOTE — Progress Notes (Signed)
Hematology/Oncology Progress Note    REASON FOR VISIT: Abnormal serum protein electrophoresis (old) & low ferritin (new)    HISTORY OF PRESENT ILLNESS: Joe Little is a 64 y.o. male who presents for follow-up of abnormal serum protein electrophoresis.    This was discovered on his annual physical in 2015.  Urine test revealed proteinuria, then spot urine showed an M-spike.  When I did blood work it was normal. No history of kidney, bone problems or hypercalcemia.    He present today for low ferritin which was found on routine blood work.     No fevers, chills, night sweats. Weight stable.  No new aches or pains.  No new lumps or bumps.  No CP/SOB/DOE.  No nausea, vomiting, diarrhea, or constipation.  No epistaxis, no hemoptysis, no hematemesis.  Did have blood in stool 2 years ago, but since then no blood per rectum, no black or tarry stool.  No hematuria.      Had a colonoscopy in 2008.      Very mild pica for ice. He has not started oral iron.     Past Medical History   Diagnosis Date   ??? Actinic keratosis      nose   ??? Depression    ??? HSV (herpes simplex virus) infection    ??? 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 PUD (peptic ulcer disease) 1982       Past Surgical History   Procedure Laterality Date   ??? Hx tonsillectomy       age 44   ??? Hx vasectomy  1989   ??? Pr breast surgery procedure unlisted  1981     gynecomastia   ??? Endoscopy, colon, diagnostic  12/08     due in 10 years       No Known Allergies    Current Outpatient Prescriptions   Medication Sig Dispense Refill   ??? tamsulosin (FLOMAX) 0.4 mg capsule Take 1 Cap by mouth two (2) times a day. 180 Cap 0   ??? acyclovir (ZOVIRAX) 400 mg tablet Take 1 Tab by mouth daily. Indications: SUPPRESSION OF RECURRENT HERPES SIMPLEX INFECTION 90 Tab 6   ??? CYANOCOBALAMIN, VITAMIN B-12, (VITAMIN B-12 PO) Take  by mouth.     ??? ARIPiprazole (ABILIFY) 2 mg tablet Take 2 mg by mouth daily.     ??? buPROPion (WELLBUTRIN) 100 mg tablet Take 100 mg by mouth daily.        ??? therapeutic multivitamin (THERAGRAN) tablet Take 1 Tab by mouth daily.       ??? omega-3 fatty acids-vitamin e (FISH OIL) 1,000 mg Cap Take 1 Cap by mouth.       ??? duloxetine (CYMBALTA) 60 mg capsule Take 60 mg by mouth daily. Indications: MAJOR DEPRESSIVE DISORDER     ??? tamsulosin (FLOMAX) 0.4 mg capsule Take 1 Cap by mouth daily. 30 Cap 0   ??? diclofenac EC (VOLTAREN) 75 mg EC tablet Take 1 Tab by mouth two (2) times a day. 60 Tab 0   ??? cyclobenzaprine (FLEXERIL) 5 mg tablet Take 1 Tab by mouth two (2) times a day. 60 Tab 0   ??? magic mouthwash solution Pharmacy to mix equal portions of ingredients to a total volume as indicated in the dispense amount. 120 mL 0   ??? clotrimazole (LOTRIMIN) 1 % topical cream Apply  to affected area two (2) times a day. 15 g 0   ??? cholecalciferol (VITAMIN D3) 1,000 unit tablet Take  by mouth daily.         Social History     Social History   ??? Marital status: MARRIED     Spouse name: [name]   ??? Number of children: 2   ??? Years of education: 61     Occupational History   ??? Secondary school teacher      Social History Main Topics   ??? Smoking status: Never Smoker   ??? Smokeless tobacco: Never Used   ??? Alcohol use 0.5 oz/week     1 Cans of beer per week   ??? Drug use: No   ??? Sexual activity: Yes     Partners: Female     Other Topics Concern   ??? None     Social History Narrative    Lives with his wife.        Family History   Problem Relation Age of Onset   ??? Alcohol abuse Mother    ??? Lung Disease Mother    ??? Alcohol abuse Father    ??? Cancer Sister      breast   ??? Alcohol abuse Sister    ??? Asthma Sister    ??? Elevated Lipids Sister    ??? Hypertension Sister    ??? Stroke Maternal Grandfather    ??? Seizures Daughter      childhood only       ROS  As per the HPI, otherwise a comprehensive ROS is negative.    Physical Examination:   Visit Vitals   ??? BP 105/72   ??? Pulse 74   ??? Temp 98 ??F (36.7 ??C)   ??? Resp 18   ??? Ht 5' 11"  (1.803 m)   ??? Wt 171 lb 12.8 oz (77.9 kg)   ??? SpO2 98%   ??? BMI 23.96 kg/m2      General appearance - alert, well appearing, and in no distress  Mental status - oriented to person, place, and time  Mouth - mucous membranes moist, pharynx normal without lesions  Neck - supple, no significant adenopathy  Lymphatics - no palpable lymphadenopathy, no hepatosplenomegaly  Chest - clear to auscultation, no wheezes, rales or rhonchi, symmetric air entry  Heart - normal rate, regular rhythm, normal S1, S2, no murmurs, rubs, clicks or gallops  Abdomen - soft, nontender, nondistended, no masses or organomegaly, bowel sounds present  Back exam - full range of motion, no tenderness, palpable spasm or pain on motion  Neurological - normal speech, no focal findings or movement disorder noted  Musculoskeletal - no joint tenderness, deformity or swelling  Extremities - peripheral pulses normal, no pedal edema, no clubbing or cyanosis  Skin - normal coloration and turgor, no rashes, no suspicious skin lesions noted    LABS  Lab Results   Component Value Date/Time    WBC 3.8 05/09/2015 09:12 AM    HGB 12.5 05/09/2015 09:12 AM    HCT 38.7 05/09/2015 09:12 AM    PLATELET 271 05/09/2015 09:12 AM    MCV 84 05/09/2015 09:12 AM    ABS. NEUTROPHILS 2.3 05/09/2015 09:12 AM     Lab Results   Component Value Date/Time    SODIUM 141 05/09/2015 09:12 AM    POTASSIUM 4.8 05/09/2015 09:12 AM    CHLORIDE 101 05/09/2015 09:12 AM    CO2 24 05/09/2015 09:12 AM    GLUCOSE 129 05/09/2015 09:12 AM    BUN 16 05/09/2015 09:12 AM    CREATININE 1.02 05/09/2015 09:12 AM  GFR EST AA 89 05/09/2015 09:12 AM    GFR EST NON-AA 77 05/09/2015 09:12 AM    CALCIUM 9.0 05/09/2015 09:12 AM     Lab Results   Component Value Date/Time    AST 13 04/13/2015 10:06 AM    ALK. PHOSPHATASE 54 04/13/2015 10:06 AM    PROTEIN, TOTAL 6.3 04/13/2015 10:06 AM    ALBUMIN 4.2 04/13/2015 10:06 AM    A-G RATIO 2.0 12/04/2012 12:23 PM       ASSESSMENT  Joe Little is a 64 y.o. male who presents for evaluation of M-spike in the urine and iron deficiency.      DISCUSSION/PLAN  1. Iron deficiency.   I explained that there are two issues here.  Fixing the iron deficiency and finding the underlying cause.  Fixing the iron deficiency should be easy.  I have prescribed oral iron to take up to 3 times per day.  I will can check another CBC, ferritin in 8 weeks to ensure that it has corrected.  The tough part is finding the source of the bleeding.  Will send to GI.  He saw Dr. Delaney Meigs in the past.     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  However, on 24 hour urine collection, there was no M-spike.  Seems like the random urine test was a false positive. He has no other signs of myeloma and serum tests look good.  Let's repeat these labs too since it has been a year.     3. Proteinuria.  This should be worked up the same as with any other patient with proteinuria.  There is no sign that this is related to a plasma cell disorder.    I'll see him in 2 months, but sooner if needed.    Annie Paras, MD    Joe Little has a reminder for a "due or due soon" health maintenance. I have asked that he contact his primary care provider for follow-up on this health maintenance.

## 2015-08-03 NOTE — Progress Notes (Signed)
Referral to gastroenterology forward to [facility] for processing.

## 2015-08-03 NOTE — Progress Notes (Signed)
Pt arrives for follow up for abnormal serum protein.  Denies pain.  Last labs drawn in August as indicated in connect care.

## 2015-09-27 LAB — CBC WITH AUTOMATED DIFF
ABS. BASOPHILS: 0 10*3/uL (ref 0.0–0.2)
ABS. EOSINOPHILS: 0.1 10*3/uL (ref 0.0–0.4)
ABS. IMM. GRANS.: 0 10*3/uL (ref 0.0–0.1)
ABS. MONOCYTES: 0.5 10*3/uL (ref 0.1–0.9)
ABS. NEUTROPHILS: 3.7 10*3/uL (ref 1.4–7.0)
Abs Lymphocytes: 1.4 10*3/uL (ref 0.7–3.1)
BASOPHILS: 1 %
EOSINOPHILS: 1 %
HCT: 44.3 % (ref 37.5–51.0)
HGB: 14.9 g/dL (ref 12.6–17.7)
IMMATURE GRANULOCYTES: 0 %
Lymphocytes: 25 %
MCH: 29.5 pg (ref 26.6–33.0)
MCHC: 33.6 g/dL (ref 31.5–35.7)
MCV: 88 fL (ref 79–97)
MONOCYTES: 8 %
NEUTROPHILS: 65 %
PLATELET: 241 10*3/uL (ref 150–379)
RBC: 5.05 x10E6/uL (ref 4.14–5.80)
RDW: 14.7 % (ref 12.3–15.4)
WBC: 5.7 10*3/uL (ref 3.4–10.8)

## 2015-09-27 LAB — METABOLIC PANEL, COMPREHENSIVE
A-G Ratio: 2 (ref 1.1–2.5)
ALT (SGPT): 15 IU/L (ref 0–44)
AST (SGOT): 18 IU/L (ref 0–40)
Albumin: 4.5 g/dL (ref 3.6–4.8)
Alk. phosphatase: 69 IU/L (ref 39–117)
BUN/Creatinine ratio: 16 (ref 10–22)
BUN: 16 mg/dL (ref 8–27)
Bilirubin, total: 0.3 mg/dL (ref 0.0–1.2)
CO2: 25 mmol/L (ref 18–29)
Calcium: 9.8 mg/dL (ref 8.6–10.2)
Chloride: 104 mmol/L (ref 96–106)
Creatinine: 1.02 mg/dL (ref 0.76–1.27)
GFR est AA: 89 mL/min/{1.73_m2} (ref >59)
GFR est non-AA: 77 mL/min/{1.73_m2} (ref >59)
GLOBULIN, TOTAL: 2.3 g/dL (ref 1.5–4.5)
Glucose: 101 mg/dL — ABNORMAL HIGH (ref 65–99)
Potassium: 5.7 mmol/L — ABNORMAL HIGH (ref 3.5–5.2)
Sodium: 144 mmol/L (ref 134–144)

## 2015-09-27 LAB — GAMMOPATHY EVAL, SPEP/IFE, IG QT/FLC
A/G ratio: 1.5 (ref 0.7–1.7)
Albumin: 4 g/dL (ref 2.9–4.4)
Alpha-1-Globulin: 0.2 g/dL (ref 0.0–0.4)
Alpha-2-Globulin: 0.6 g/dL (ref 0.4–1.0)
Beta Globulin: 1.1 g/dL (ref 0.7–1.3)
Free Kappa Lt Chains, serum: 13.77 mg/L (ref 3.30–19.40)
Free Lambda Lt Chains, serum: 15.96 mg/L (ref 5.71–26.30)
Gamma Globulin: 0.9 g/dL (ref 0.4–1.8)
Globulin, total: 2.8 g/dL (ref 2.2–3.9)
Immunoglobulin A, Qt.: 222 mg/dL (ref 61–437)
Immunoglobulin G, Qt.: 860 mg/dL (ref 700–1600)
Immunoglobulin M, Qt.: 8 mg/dL — ABNORMAL LOW (ref 20–172)
Kappa/Lambda ratio, serum: 0.86 (ref 0.26–1.65)
Protein, total: 6.8 g/dL (ref 6.0–8.5)

## 2015-09-27 LAB — FERRITIN: Ferritin: 48 ng/mL (ref 30–400)

## 2015-10-03 ENCOUNTER — Encounter

## 2015-10-03 ENCOUNTER — Ambulatory Visit
Admit: 2015-10-03 | Discharge: 2015-10-03 | Payer: PRIVATE HEALTH INSURANCE | Attending: Internal Medicine | Primary: Family Medicine

## 2015-10-03 DIAGNOSIS — D500 Iron deficiency anemia secondary to blood loss (chronic): Secondary | ICD-10-CM

## 2015-10-03 NOTE — Progress Notes (Signed)
Joe Little is a 65 y.o. male here to follow up for Abnormal serum protein electrophoresis.

## 2015-10-03 NOTE — Telephone Encounter (Signed)
Joe Little  GI referral

## 2015-10-03 NOTE — Progress Notes (Signed)
Hematology/Oncology Progress Note    REASON FOR VISIT: Abnormal serum protein electrophoresis (old) & low ferritin (new)    HISTORY OF PRESENT ILLNESS: [name] is a 65 y.o. male who presents for follow-up of abnormal serum protein electrophoresis.    This was discovered on his annual physical in 2015.  Urine test revealed proteinuria, then spot urine showed an M-spike.  When I did blood work it was normal. No history of kidney, bone problems or hypercalcemia.    He present today for follow-up of low ferritin which was found on routine blood work.     He is tolerating iron TID.  Dark stool, but no stomach upset.  No pica for ice.  Energy stable.      No fevers, chills, night sweats. Weight stable.  No new aches or pains.  No new lumps or bumps.  No CP/SOB/DOE.  No nausea, vomiting, diarrhea, or constipation.  No epistaxis, no hemoptysis, no hematemesis.  Did have blood in stool 2 years ago, but since then no blood per rectum, no black or tarry stool.  No hematuria.      Had a colonoscopy in 2008.      Past Medical History   Diagnosis Date   ??? Actinic keratosis      nose   ??? Depression    ??? HSV (herpes simplex virus) infection    ??? 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 PUD (peptic ulcer disease) 1982       Past Surgical History   Procedure Laterality Date   ??? Hx tonsillectomy       age 43   ??? Hx vasectomy  1989   ??? Pr breast surgery procedure unlisted  1981     gynecomastia   ??? Endoscopy, colon, diagnostic  12/08     due in 10 years       No Known Allergies    Current Outpatient Prescriptions   Medication Sig Dispense Refill   ??? ferrous sulfate 325 mg (65 mg iron) tablet Take 1 Tab by mouth three (3) times daily (with meals). 90 Tab 1   ??? acyclovir (ZOVIRAX) 400 mg tablet Take 1 Tab by mouth daily. Indications: SUPPRESSION OF RECURRENT HERPES SIMPLEX INFECTION 90 Tab 6   ??? ARIPiprazole (ABILIFY) 2 mg tablet Take 2 mg by mouth daily.     ??? buPROPion (WELLBUTRIN) 100 mg tablet Take 100 mg by mouth daily.        ??? therapeutic multivitamin (THERAGRAN) tablet Take 1 Tab by mouth daily.       ??? omega-3 fatty acids-vitamin e (FISH OIL) 1,000 mg Cap Take 1 Cap by mouth.       ??? duloxetine (CYMBALTA) 60 mg capsule Take 60 mg by mouth daily. Indications: MAJOR DEPRESSIVE DISORDER     ??? tamsulosin (FLOMAX) 0.4 mg capsule Take 1 Cap by mouth two (2) times a day. 180 Cap 0   ??? tamsulosin (FLOMAX) 0.4 mg capsule Take 1 Cap by mouth daily. 30 Cap 0   ??? diclofenac EC (VOLTAREN) 75 mg EC tablet Take 1 Tab by mouth two (2) times a day. 60 Tab 0   ??? cyclobenzaprine (FLEXERIL) 5 mg tablet Take 1 Tab by mouth two (2) times a day. 60 Tab 0   ??? CYANOCOBALAMIN, VITAMIN B-12, (VITAMIN B-12 PO) Take  by mouth.     ??? magic mouthwash solution Pharmacy to mix equal portions of ingredients to a total volume as indicated in the  dispense amount. 120 mL 0   ??? clotrimazole (LOTRIMIN) 1 % topical cream Apply  to affected area two (2) times a day. 15 g 0   ??? cholecalciferol (VITAMIN D3) 1,000 unit tablet Take  by mouth daily.         Social History     Social History   ??? Marital status: MARRIED     Spouse name: Joe Little   ??? Number of children: 2   ??? Years of education: 69     Occupational History   ??? Secondary school teacher      Social History Main Topics   ??? Smoking status: Never Smoker   ??? Smokeless tobacco: Never Used   ??? Alcohol use 0.5 oz/week     1 Cans of beer per week   ??? Drug use: No   ??? Sexual activity: Yes     Partners: Female     Other Topics Concern   ??? None     Social History Narrative    Lives with his wife.        Family History   Problem Relation Age of Onset   ??? Alcohol abuse Mother    ??? Lung Disease Mother    ??? Alcohol abuse Father    ??? Cancer Sister      breast   ??? Alcohol abuse Sister    ??? Asthma Sister    ??? Elevated Lipids Sister    ??? Hypertension Sister    ??? Stroke Maternal Grandfather    ??? Seizures Daughter      childhood only       ROS  As per the HPI, otherwise a comprehensive ROS is negative.    Physical Examination:   Visit Vitals    ??? BP 109/70 (BP 1 Location: Left arm, BP Patient Position: Sitting)   ??? Pulse 79   ??? Temp 98.6 ??F (37 ??C) (Oral)   ??? Resp 18   ??? Ht 5' 11"  (1.803 m)   ??? Wt 173 lb 6.4 oz (78.7 kg)   ??? SpO2 98%   ??? BMI 24.18 kg/m2     General appearance - alert, well appearing, and in no distress  Mental status - oriented to person, place, and time  Mouth - mucous membranes moist, pharynx normal without lesions  Neck - supple, no significant adenopathy  Lymphatics - no palpable lymphadenopathy, no hepatosplenomegaly  Chest - clear to auscultation, no wheezes, rales or rhonchi, symmetric air entry  Heart - normal rate, regular rhythm, normal S1, S2, no murmurs, rubs, clicks or gallops  Abdomen - soft, nontender, nondistended, no masses or organomegaly, bowel sounds present  Back exam - full range of motion, no tenderness, palpable spasm or pain on motion  Neurological - normal speech, no focal findings or movement disorder noted  Musculoskeletal - no joint tenderness, deformity or swelling  Extremities - peripheral pulses normal, no pedal edema, no clubbing or cyanosis  Skin - normal coloration and turgor, no rashes, no suspicious skin lesions noted    LABS  Lab Results   Component Value Date/Time    WBC 5.7 09/26/2015 10:31 AM    HGB 14.9 09/26/2015 10:31 AM    HCT 44.3 09/26/2015 10:31 AM    PLATELET 241 09/26/2015 10:31 AM    MCV 88 09/26/2015 10:31 AM    ABS. NEUTROPHILS 3.7 09/26/2015 10:31 AM     Lab Results   Component Value Date/Time    Sodium 144 09/26/2015 10:31 AM  Potassium 5.7 09/26/2015 10:31 AM    Chloride 104 09/26/2015 10:31 AM    CO2 25 09/26/2015 10:31 AM    Glucose 101 09/26/2015 10:31 AM    BUN 16 09/26/2015 10:31 AM    Creatinine 1.02 09/26/2015 10:31 AM    GFR est AA 89 09/26/2015 10:31 AM    GFR est non-AA 77 09/26/2015 10:31 AM    Calcium 9.8 09/26/2015 10:31 AM     Lab Results   Component Value Date/Time    AST 18 09/26/2015 10:31 AM    Alk. phosphatase 69 09/26/2015 10:31 AM     Protein, total 6.8 09/26/2015 10:31 AM    Albumin 4.5 09/26/2015 10:31 AM    A-G Ratio 2.0 09/26/2015 10:31 AM       ASSESSMENT  [name] is a 65 y.o. male who presents for evaluation of M-spike in the urine and iron deficiency.     DISCUSSION/PLAN  1. Iron deficiency.   Iron and hemoglobin have normalized with oral iron therapy.  He can decrease his dose of iron from TID to one every day.  He still needs to see GI.  There was an issue with referral at last visit.  Will refer again.  He saw Dr. Delaney Meigs in the past.     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  However, on 24 hour urine collection, there was no M-spike.  Seems like the random urine test was a false positive. He has no other signs of myeloma and serum tests look good.  Blood work looks good on recheck.    3. Proteinuria.  This should be worked up the same as with any other patient with proteinuria.  There is no sign that this is related to a plasma cell disorder.    4. Hyperkalemia.  Mild.  Not on diuretics or supplementation.  Sees PCP next month.    He should see GI, continue daily iron for a few more months.  If all looks good on recheck at that time, he can stop iron and have blood monitored.  He'd prefer to follow with PCP for this, but I can see him back as needed.    Annie Paras, MD    [name] has a reminder for a "due or due soon" health maintenance. I have asked that he contact his primary care provider for follow-up on this health maintenance.

## 2015-10-03 NOTE — Telephone Encounter (Signed)
Recent lab values faxed to PCP.  Note:  K+

## 2015-10-19 NOTE — Addendum Note (Signed)
Addended by: Glean Salen on: 10/19/2015 02:15 PM      Modules accepted: Orders

## 2015-12-19 ENCOUNTER — Inpatient Hospital Stay: Admit: 2015-12-19 | Payer: PRIVATE HEALTH INSURANCE | Primary: Family Medicine

## 2015-12-20 ENCOUNTER — Encounter

## 2015-12-24 ENCOUNTER — Encounter

## 2015-12-24 MED ORDER — FERROUS SULFATE 325 MG (65 MG ELEMENTAL IRON) TAB
325 mg (65 mg iron) | ORAL_TABLET | Freq: Every day | ORAL | 0 refills | Status: DC
Start: 2015-12-24 — End: 2016-01-22

## 2015-12-24 NOTE — Telephone Encounter (Signed)
Refilled ferrous sulfate for patient to take once daily (last dose per Dr. Knute Neu last note). Per last office note, patient was to have labs rechecked which were ordered in January. If labs had improved, could stop iron tablets. Please follow-up with patient on status of labs.

## 2015-12-24 NOTE — Telephone Encounter (Signed)
[facility] calling to get a refill for the following medication sent to them for the patient.  Requested Prescriptions     Pending Prescriptions Disp Refills   ??? ferrous sulfate 325 mg (65 mg iron) tablet [Pharmacy Med Name: FERROUS SULFATE 325 MG TABLET] 90 Tab 1     Sig: TAKE 1 TAB BY MOUTH THREE (3) TIMES DAILY (WITH MEALS).

## 2016-01-08 ENCOUNTER — Inpatient Hospital Stay: Admit: 2016-01-08 | Payer: PRIVATE HEALTH INSURANCE | Primary: Family Medicine

## 2016-01-22 ENCOUNTER — Inpatient Hospital Stay: Admit: 2016-01-22 | Payer: PRIVATE HEALTH INSURANCE | Primary: Family Medicine

## 2016-01-22 ENCOUNTER — Encounter: Primary: Family Medicine

## 2016-01-22 ENCOUNTER — Encounter

## 2016-01-22 MED ORDER — FERROUS SULFATE 325 MG (65 MG ELEMENTAL IRON) TAB
325 mg (65 mg iron) | ORAL_TABLET | ORAL | 0 refills | Status: DC
Start: 2016-01-22 — End: 2016-02-24

## 2016-02-24 ENCOUNTER — Encounter

## 2016-02-25 MED ORDER — FERROUS SULFATE 325 MG (65 MG ELEMENTAL IRON) TAB
325 mg (65 mg iron) | ORAL_TABLET | ORAL | 0 refills | Status: DC
Start: 2016-02-25 — End: 2016-04-04

## 2016-02-25 NOTE — Telephone Encounter (Signed)
Received refill request for ferrous sulfate tablets. Will refill for patient. Per Dr. Knute Neu last office note in January, patient was to have labs rechecked by PCP and possible stop iron. Please call patient to check on status of repeat labs through PCP.

## 2016-03-30 ENCOUNTER — Encounter

## 2016-03-31 NOTE — Telephone Encounter (Signed)
Received refill request for ferrous sulfate. Per last note from Dr. Donivan Scull, patient was going to have labs rechecked and PCP and possibly stop iron. Please check with patient to see if PCP is monitoring labs and managing his oral iron.

## 2016-04-01 ENCOUNTER — Encounter

## 2016-04-04 ENCOUNTER — Encounter

## 2016-04-04 MED ORDER — FERROUS SULFATE 325 MG (65 MG ELEMENTAL IRON) TAB
325 mg (65 mg iron) | ORAL_TABLET | ORAL | 3 refills | Status: AC
Start: 2016-04-04 — End: ?

## 2016-04-04 NOTE — Telephone Encounter (Signed)
Pt needs a refill on his iron tablets,  would like a call when this script, has been sent to pharmacy.  [facility]ants [facility] [place]

## 2016-08-01 ENCOUNTER — Encounter

## 2016-08-05 NOTE — Telephone Encounter (Signed)
Iron supplements and labs to be followed by PCP

## 2016-08-09 ENCOUNTER — Encounter

## 2016-09-15 HISTORY — PX: CYSTOSCOPY WITH INSERTION OF UROLIFT: SHX6678

## 2016-09-15 HISTORY — PX: SKIN CANCER EXCISION: SHX779

## 2017-07-31 ENCOUNTER — Inpatient Hospital Stay: Admit: 2017-07-31 | Payer: PRIVATE HEALTH INSURANCE | Primary: Family Medicine

## 2019-06-01 LAB — PSA: PSA: 1.158

## 2019-06-01 LAB — FECAL OCCULT BLOOD, GUAIAC: Fecal Occult Blood: NEGATIVE

## 2019-06-01 LAB — HEPATIC FUNCTION PANEL
ALT: 14 (ref 10–40)
AST: 17 (ref 14–40)

## 2019-06-01 LAB — HEMOGLOBIN A1C: Hemoglobin A1C: 5.4

## 2020-06-20 ENCOUNTER — Encounter: Payer: Self-pay | Admitting: Physician Assistant

## 2020-06-20 ENCOUNTER — Other Ambulatory Visit: Payer: Self-pay

## 2020-06-20 ENCOUNTER — Ambulatory Visit (INDEPENDENT_AMBULATORY_CARE_PROVIDER_SITE_OTHER): Payer: Medicare Other | Admitting: Physician Assistant

## 2020-06-20 VITALS — BP 120/72 | HR 76 | Temp 97.9°F | Ht 72.0 in | Wt 157.0 lb

## 2020-06-20 DIAGNOSIS — F325 Major depressive disorder, single episode, in full remission: Secondary | ICD-10-CM

## 2020-06-20 DIAGNOSIS — M25522 Pain in left elbow: Secondary | ICD-10-CM

## 2020-06-20 MED ORDER — BUPROPION HCL ER (XL) 300 MG PO TB24: 300.0000 mg | ORAL_TABLET | Freq: Every day | ORAL | 3 refills | Status: DC

## 2020-06-20 MED ORDER — ARIPIPRAZOLE 5 MG PO TABS: 2.5000 mg | ORAL_TABLET | Freq: Every day | ORAL | 3 refills | Status: DC

## 2020-06-20 MED ORDER — MELOXICAM 15 MG PO TABS: 15.0000 mg | ORAL_TABLET | Freq: Every day | ORAL | 0 refills | Status: DC

## 2020-06-20 MED ORDER — DULOXETINE HCL 30 MG PO CPEP: 30.0000 mg | ORAL_CAPSULE | Freq: Every day | ORAL | 3 refills | Status: DC

## 2020-06-20 NOTE — Progress Notes (Signed)
Andrew Benitez is a 69 y.o. male is here to establish care.  I acted as a Education administrator for [employer], PA-C Andrew Pickler, LPN   History of Present Illness:   Chief Complaint  Patient presents with  . Establish Care  . Elbow Pain    HPI   Elbow pain; LEFT Pt c/o pain since August 11th, was moving boxes and over did it. Taken Advil no relief. Denies numbness or tingling in hand. With time symptoms are unchanged. Has tried compression with good relief.  R-handed. Denies weakness in his left hand. 4/10 pain.   Depression Has been on this regimen for quite some time. Abilify 2.5 mg daily, Wellbutrin 300 mg XL daily, Cymbalta 30 mg daily. Denies SI/HI.  Depression screen PHQ 2/9 06/20/2020  Decreased Interest 0  Down, Depressed, Hopeless 0  PHQ - 2 Score 0  Altered sleeping 0  Tired, decreased energy 0  Change in appetite 0  Feeling bad or failure about yourself  0  Trouble concentrating 0  Moving slowly or fidgety/restless 0  Suicidal thoughts 0  PHQ-9 Score 0  Difficult doing work/chores Not difficult at all     Health Maintenance Due  Topic Date Due  . Hepatitis C Screening  Never done  . TETANUS/TDAP  Never done  . COLONOSCOPY  Never done  . PNA vac Low Risk Adult (1 of 2 - PCV13) Never done  . INFLUENZA VACCINE  Never done    Past Medical History:  Diagnosis Date  . Depression   . History of shingles 1990  . 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 Smoking status: Never Smoker  . Smokeless tobacco: Never Used  Vaping Use  . Vaping Use: Never used  Substance Use Topics  . Alcohol use: Yes    Comment: one beer every 2 weeks  . Drug use: Yes    Types: Marijuana    Comment: once a week    Past Surgical History:  Procedure Laterality Date  . MASTECTOMY FOR GYNECOMASTIA Left 1983  . SKIN CANCER EXCISION  2018   right side of neck and left forehead  . TONSILLECTOMY      Family  History  Problem Relation Age of Onset  . Alcohol abuse Mother   . Arthritis Mother   . COPD Mother   . Alcohol abuse Father   . Depression Father   . Breast cancer Sister   . Alcohol abuse Sister   . Depression Sister   . Asthma Sister   . Hyperlipidemia Sister   . Heart attack Paternal Grandmother     PMHx, SurgHx, SocialHx, FamHx, Medications, and Allergies were reviewed in the Visit Navigator and updated as appropriate.   There are no problems to display for this patient.   Social History   Tobacco Use  . Smoking status: Never Smoker  . Smokeless tobacco: Never Used  Vaping Use  . Vaping Use: Never used  Substance Use Topics  . Alcohol use: Yes    Comment: one beer every 2 weeks  . Drug use: Yes    Types: Marijuana    Comment: once a week    Current Medications and Allergies:    Current Outpatient Medications:  .  ARIPiprazole (ABILIFY) 5 MG tablet, Take 0.5 tablets (2.5 mg total) by mouth daily., Disp: 45 tablet, Rfl: 3 .  buPROPion (WELLBUTRIN XL) 300 MG  24 hr tablet, Take 1 tablet (300 mg total) by mouth daily., Disp: 90 tablet, Rfl: 3 .  DULoxetine (CYMBALTA) 30 MG capsule, Take 1 capsule (30 mg total) by mouth daily., Disp: 90 capsule, Rfl: 3 .  FIBER PO, Take 1 each by mouth in the morning and at bedtime. Tablespoon, Disp: , Rfl:  .  ibuprofen (ADVIL) 200 MG tablet, Take 400 mg by mouth every 6 (six) hours as needed., Disp: , Rfl:  .  Omega-3 Fatty Acids (FISH OIL) 1000 MG CAPS, Take 1 capsule by mouth daily., Disp: , Rfl:  .  meloxicam (MOBIC) 15 MG tablet, Take 1 tablet (15 mg total) by mouth daily., Disp: 30 tablet, Rfl: 0  No Known Allergies  Review of Systems   ROS  Negative unless otherwise specified per HPI.  Vitals:   Vitals:   06/20/20 0848  BP: 120/72  Pulse: 76  Temp: 97.9 F (36.6 C)  TempSrc: Temporal  SpO2: 97%  Weight: 157 lb (71.2 kg)  Height: 6' (1.829 m)     Body mass index is 21.29 kg/m.   Physical Exam:    Physical  Exam Vitals and nursing note reviewed.  Constitutional:      General: He is not in acute distress.    Appearance: He is well-developed. He is not ill-appearing or toxic-appearing.  Cardiovascular:     Rate and Rhythm: Normal rate and regular rhythm.     Pulses: Normal pulses.     Heart sounds: Normal heart sounds, S1 normal and S2 normal.     Comments: No LE edema Pulmonary:     Effort: Pulmonary effort is normal.     Breath sounds: Normal breath sounds.  Musculoskeletal:     Comments: L elbow with no palpable tenderness or obvious swelling Endorses pain with resisted abduction and adduction  Skin:    General: Skin is warm and dry.  Neurological:     Mental Status: He is alert.     GCS: GCS eye subscore is 4. GCS verbal subscore is 5. GCS motor subscore is 6.     Comments: Grip strength 5/5  Psychiatric:        Speech: Speech normal.        Behavior: Behavior normal. Behavior is cooperative.      Assessment and Plan:    Andrew Benitez was seen today for establish care and elbow pain.  Diagnoses and all orders for this visit:  Depression, major, single episode, complete remission ([place]) Well controlled. Refill medications below. Follow-up in 6 months to 1 year, sooner if concerns. -     ARIPiprazole (ABILIFY) 5 MG tablet; Take 0.5 tablets (2.5 mg total) by mouth daily. -     buPROPion (WELLBUTRIN XL) 300 MG 24 hr tablet; Take 1 tablet (300 mg total) by mouth daily. -     DULoxetine (CYMBALTA) 30 MG capsule; Take 1 capsule (30 mg total) by mouth daily.  Left elbow pain Suspect elbow sprain. Continue compression. Trial daily mobic. If no symptom improvement or any worsening in the next 2 weeks, will refer to sports medicine.  Other orders  -     meloxicam (MOBIC) 15 MG tablet; Take 1 tablet (15 mg total) by mouth daily.   CMA or LPN served as scribe during this visit. History, Physical, and Plan performed by medical provider. The above documentation has been reviewed and is  accurate and complete.   Andrew Coke, PA-C [place], [place] 06/20/2020  Follow-up: No follow-ups on file.

## 2020-06-20 NOTE — Patient Instructions (Signed)
It was great to see you!  Refills placed today.  May trial daily Mobic 15 mg for your elbow sprain.  If symptoms do not improve or if they worsen, let me know.  Follow-up at your convenience for a physical.  Take care,  Inda Coke PA-C

## 2020-07-06 ENCOUNTER — Encounter: Payer: Self-pay | Admitting: Physician Assistant

## 2020-07-06 ENCOUNTER — Other Ambulatory Visit: Payer: Self-pay

## 2020-07-06 ENCOUNTER — Ambulatory Visit (INDEPENDENT_AMBULATORY_CARE_PROVIDER_SITE_OTHER): Payer: Medicare Other | Admitting: Physician Assistant

## 2020-07-06 VITALS — BP 110/70 | HR 65 | Temp 98.4°F | Ht 72.0 in | Wt 155.4 lb

## 2020-07-06 DIAGNOSIS — F325 Major depressive disorder, single episode, in full remission: Secondary | ICD-10-CM

## 2020-07-06 DIAGNOSIS — R351 Nocturia: Secondary | ICD-10-CM | POA: Diagnosis not present

## 2020-07-06 DIAGNOSIS — Z1159 Encounter for screening for other viral diseases: Secondary | ICD-10-CM

## 2020-07-06 DIAGNOSIS — Z13220 Encounter for screening for lipoid disorders: Secondary | ICD-10-CM

## 2020-07-06 DIAGNOSIS — Z125 Encounter for screening for malignant neoplasm of prostate: Secondary | ICD-10-CM | POA: Diagnosis not present

## 2020-07-06 DIAGNOSIS — Z136 Encounter for screening for cardiovascular disorders: Secondary | ICD-10-CM

## 2020-07-06 DIAGNOSIS — Z23 Encounter for immunization: Secondary | ICD-10-CM

## 2020-07-06 DIAGNOSIS — Z0000 Encounter for general adult medical examination without abnormal findings: Secondary | ICD-10-CM

## 2020-07-06 NOTE — Progress Notes (Addendum)
I acted as a Education administrator for [employer], PA-C Anselmo Pickler, LPN   Subjective:    Andrew Benitez is a 69 y.o. male and is here for follow-up of medical issues   HPI  Health Maintenance Due  Topic Date Due  . Hepatitis C Screening  Never done  . INFLUENZA VACCINE  Never done    Acute Concerns: None  Chronic Issues: Nocturia -- 2-3 times a night. When he is stressed during the day, has more frequent urination -- but this is not apparent to him anymore now that he has decreased stress with work.  Nocturia has been going on for 5-10 years. Tried Flomax which helped for a while but then was no longer effective. Had a urolift about 3 years ago that was not helpful. Depression -- stable. Doing well on Initial: 0.25 mg once weekly for 4 weeks, then increase to 0.5 mg once weekly  Health Maintenance: Immunizations -- UTD, will give Flu today Colonoscopy -- done 3 yrs, normal per pt  PSA -- 2020, normal per pt. Diet -- eats healthy food choices; ice water; diet dr pepper at lunch Caffeine intake -- one diet dr pepper daily Sleep habits -- good Exercise -- walks 2 miles daily Weight -- Weight: 155 lb 6.1 oz (70.5 kg)  Recent weight history Wt Readings from Last 10 Encounters:  07/06/20 155 lb 6.1 oz (70.5 kg)  06/20/20 157 lb (71.2 kg)   Body mass index is 21.07 kg/m. Mood -- stable Alcohol use -- 1 per week Tobacco use -- never smoked  Depression screen [facility] 2/9 07/06/2020  Decreased Interest 0  Down, Depressed, Hopeless 0  PHQ - 2 Score 0  Altered sleeping 0  Tired, decreased energy 0  Change in appetite 0  Feeling bad or failure about yourself  0  Trouble concentrating 0  Moving slowly or fidgety/restless 0  Suicidal thoughts 0  PHQ-9 Score 0  Difficult doing work/chores Not difficult at all   Eye doctor pending Dentist overdue  Other providers/specialists: Patient Care Team: Inda Coke, [place] as PCP - General (Physician Assistant)     PMHx, SurgHx,  SocialHx, Medications, and Allergies were reviewed in the Visit Navigator and updated as appropriate.   Past Medical History:  Diagnosis Date  . Depression   . History of shingles 1990  . 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 MASTECTOMY FOR GYNECOMASTIA Left 1983  . SKIN CANCER EXCISION  2018   right side of neck and left forehead  . TONSILLECTOMY       Family History  Problem Relation Age of Onset  . Alcohol abuse Mother   . Arthritis Mother   . COPD Mother   . Alcohol abuse Father   . Depression Father   . Breast cancer Sister   . Alcohol abuse Sister   . Depression Sister   . Asthma Sister   . Hyperlipidemia Sister   . Heart attack Paternal Grandmother     Social History   Tobacco Use  . Smoking status: Never Smoker  . Smokeless tobacco: Never Used  Vaping Use  . Vaping Use: Never used  Substance Use Topics  . Alcohol use: Yes    Comment: one beer every 2 weeks  . Drug use: Yes    Types: Marijuana    Comment: once a week    Review of Systems:  Review of Systems  Constitutional: Negative for chills, fever, malaise/fatigue and weight loss.  HENT: Negative for hearing loss, sinus pain and sore throat.   Eyes: Negative for blurred vision.  Respiratory: Negative for cough and shortness of breath.   Cardiovascular: Negative for chest pain, palpitations and leg swelling.  Gastrointestinal: Negative for abdominal pain, constipation, diarrhea, heartburn, nausea and vomiting.  Genitourinary: Negative for dysuria, frequency and urgency.  Musculoskeletal: Negative for back pain, myalgias and neck pain.  Skin: Negative for itching and rash.  Neurological: Negative for dizziness, tingling, seizures, loss of consciousness and headaches.  Endo/Heme/Allergies: Negative for polydipsia.  Psychiatric/Behavioral: Negative for depression. The patient is not nervous/anxious.     All other systems reviewed and are negative.   Objective:    Vitals:   07/06/20 1125  BP: 110/70  Pulse: 65  Temp: 98.4 F (36.9 C)  SpO2: 97%   Body mass index is 21.07 kg/m.  General  Alert, cooperative, no distress, appears stated age  Head:  Normocephalic, without obvious abnormality, atraumatic  Eyes:  PERRL, conjunctiva/corneas clear, EOM's intact, fundi benign, both eyes       Ears:  Normal TM's and external ear canals, both ears  Nose: Nares normal, septum midline, mucosa normal, no drainage or sinus tenderness  Throat: Lips, mucosa, and tongue normal; teeth and gums normal  Neck: Supple, symmetrical, trachea midline, no adenopathy;     thyroid:  No enlargement/tenderness/nodules; no carotid bruit or JVD  Back:   Symmetric, no curvature, ROM normal, no CVA tenderness  Lungs:   Clear to auscultation bilaterally, respirations unlabored  Chest wall:  No tenderness or deformity  Heart:  Regular rate and rhythm, S1 and S2 normal, no murmur, rub or gallop  Abdomen:   Soft, non-tender, bowel sounds active all four quadrants, no masses, no organomegaly  Extremities: Extremities normal, atraumatic, no cyanosis or edema  Prostate : Not done   Skin: Skin color, texture, turgor normal, no rashes or lesions  Lymph nodes: Cervical, supraclavicular, and axillary nodes normal  Neurologic: CNII-XII grossly intact. Normal strength, sensation and reflexes throughout   AssessmentPlan:   [name] was seen today for annual exam.  Diagnoses and all orders for this visit:  Depression, major, single episode, complete remission ([place]) Stable. Continue Abilify 2.5 mg daily, Wellbutrin 300 mg XL daily, Cymbalta 30 mg daily. Denies SI/HI. -     Comprehensive metabolic panel; Future -     CBC with Differential/Platelet; Future  Prostate cancer screening -     PSA; Future  Encounter for lipid screening for cardiovascular disease -     Lipid panel; Future  Encounter for screening for  other viral diseases -     Hepatitis C antibody; Future  Nocturia more than twice per night Declines urology referral. Update PSA today. -     PSA; Future   Well Adult Exam: Labs ordered: Yes. Patient counseling was done. See below for items discussed.   Discussed the patient's BMI.  The BMI BMI is in the acceptable range   Follow up in one year.   Patient Counseling: [x]   Nutrition: Stressed importance of moderation in sodium/caffeine intake, saturated fat and cholesterol, caloric balance, sufficient intake of fresh fruits, vegetables, and fiber  [x]   Stressed the importance of regular exercise.   []   Substance Abuse: Discussed cessation/primary prevention of tobacco, alcohol, or other drug use; driving or other dangerous activities under the influence; availability of treatment for abuse.   [x]   Injury prevention: Discussed safety  belts, safety helmets, smoke detector, smoking near bedding or upholstery.   []   Sexuality: Discussed sexually transmitted diseases, partner selection, use of condoms, avoidance of unintended pregnancy  and contraceptive alternatives.   [x]   Dental health: Discussed importance of regular tooth brushing, flossing, and dental visits.  [x]   Health maintenance and immunizations reviewed. Please refer to Health maintenance section.   CMA or LPN served as scribe during this visit. History, Physical, and Plan performed by medical provider. The above documentation has been reviewed and is accurate and complete.  Inda Coke, PA-C [place]

## 2020-07-06 NOTE — Patient Instructions (Signed)

## 2020-07-09 LAB — COMPREHENSIVE METABOLIC PANEL
AG Ratio: 2 (calc) (ref 1.0–2.5)
ALT: 47 U/L — ABNORMAL HIGH (ref 9–46)
AST: 39 U/L — ABNORMAL HIGH (ref 10–35)
Albumin: 4.1 g/dL (ref 3.6–5.1)
Alkaline phosphatase (APISO): 57 U/L (ref 35–144)
BUN: 17 mg/dL (ref 7–25)
CO2: 27 mmol/L (ref 20–32)
Calcium: 9.1 mg/dL (ref 8.6–10.3)
Chloride: 106 mmol/L (ref 98–110)
Creat: 1 mg/dL (ref 0.70–1.25)
Globulin: 2.1 g/dL (calc) (ref 1.9–3.7)
Glucose, Bld: 92 mg/dL (ref 65–99)
Potassium: 5.3 mmol/L (ref 3.5–5.3)
Sodium: 139 mmol/L (ref 135–146)
Total Bilirubin: 0.5 mg/dL (ref 0.2–1.2)
Total Protein: 6.2 g/dL (ref 6.1–8.1)

## 2020-07-09 LAB — HEPATITIS C ANTIBODY
Hepatitis C Ab: NONREACTIVE
SIGNAL TO CUT-OFF: 0.03 (ref <1.00)

## 2020-07-09 LAB — CBC WITH DIFFERENTIAL/PLATELET
Absolute Monocytes: 422 cells/uL (ref 200–950)
Basophils Absolute: 30 cells/uL (ref 0–200)
Basophils Relative: 0.8 %
Eosinophils Absolute: 61 cells/uL (ref 15–500)
Eosinophils Relative: 1.6 %
HCT: 34.7 % — ABNORMAL LOW (ref 38.5–50.0)
Hemoglobin: 11.1 g/dL — ABNORMAL LOW (ref 13.2–17.1)
Lymphs Abs: 1345 cells/uL (ref 850–3900)
MCH: 27.2 pg (ref 27.0–33.0)
MCHC: 32 g/dL (ref 32.0–36.0)
MCV: 85 fL (ref 80.0–100.0)
MPV: 9.7 fL (ref 7.5–12.5)
Monocytes Relative: 11.1 %
Neutro Abs: 1942 cells/uL (ref 1500–7800)
Neutrophils Relative %: 51.1 %
Platelets: 258 10*3/uL (ref 140–400)
RBC: 4.08 10*6/uL — ABNORMAL LOW (ref 4.20–5.80)
RDW: 13.2 % (ref 11.0–15.0)
Total Lymphocyte: 35.4 %
WBC: 3.8 10*3/uL (ref 3.8–10.8)

## 2020-07-09 LAB — LIPID PANEL
Cholesterol: 146 mg/dL (ref <200)
HDL: 54 mg/dL (ref >=40)
LDL Cholesterol (Calc): 79 mg/dL (calc)
Non-HDL Cholesterol (Calc): 92 mg/dL (calc) (ref <130)
Total CHOL/HDL Ratio: 2.7 (calc) (ref <5.0)
Triglycerides: 51 mg/dL (ref <150)

## 2020-07-09 LAB — PSA: PSA: 0.51 ng/mL (ref <=4.0)

## 2020-07-10 ENCOUNTER — Other Ambulatory Visit: Payer: Self-pay | Admitting: Physician Assistant

## 2020-07-10 DIAGNOSIS — D649 Anemia, unspecified: Secondary | ICD-10-CM

## 2020-07-10 DIAGNOSIS — R7989 Other specified abnormal findings of blood chemistry: Secondary | ICD-10-CM

## 2020-07-12 ENCOUNTER — Telehealth: Payer: Self-pay | Admitting: *Deleted

## 2020-07-16 ENCOUNTER — Other Ambulatory Visit: Payer: Self-pay | Admitting: *Deleted

## 2020-07-16 DIAGNOSIS — R7989 Other specified abnormal findings of blood chemistry: Secondary | ICD-10-CM

## 2020-07-16 DIAGNOSIS — D649 Anemia, unspecified: Secondary | ICD-10-CM

## 2020-07-16 NOTE — Telephone Encounter (Signed)
Error

## 2020-07-18 ENCOUNTER — Ambulatory Visit: Payer: Medicare Other | Admitting: Physician Assistant

## 2020-07-18 ENCOUNTER — Telehealth: Payer: Self-pay

## 2020-07-18 ENCOUNTER — Other Ambulatory Visit: Payer: Self-pay

## 2020-07-18 NOTE — Telephone Encounter (Signed)
Pt wants to know his A1c number from lis last visit. However, I don't see that listed in his labs. Was this checked?

## 2020-07-19 NOTE — Telephone Encounter (Signed)
Left detailed message on personal voicemail that A1c was not done, due to glucose was 92 which is normal, no reason to do an A1c. Any questions please call the office.

## 2020-07-24 ENCOUNTER — Encounter: Payer: Self-pay | Admitting: Physician Assistant

## 2020-10-10 ENCOUNTER — Other Ambulatory Visit (INDEPENDENT_AMBULATORY_CARE_PROVIDER_SITE_OTHER): Payer: Medicare Other

## 2020-10-10 DIAGNOSIS — D649 Anemia, unspecified: Secondary | ICD-10-CM

## 2020-10-10 DIAGNOSIS — R7989 Other specified abnormal findings of blood chemistry: Secondary | ICD-10-CM

## 2020-10-10 LAB — HEPATIC FUNCTION PANEL
ALT: 19 U/L (ref 0–53)
AST: 19 U/L (ref 0–37)
Albumin: 4.2 g/dL (ref 3.5–5.2)
Alkaline Phosphatase: 61 U/L (ref 39–117)
Bilirubin, Direct: 0.1 mg/dL (ref 0.0–0.3)
Total Bilirubin: 0.4 mg/dL (ref 0.2–1.2)
Total Protein: 6.6 g/dL (ref 6.0–8.3)

## 2020-10-10 LAB — CBC WITH DIFFERENTIAL/PLATELET
Basophils Absolute: 0.1 10*3/uL (ref 0.0–0.1)
Basophils Relative: 2.1 % (ref 0.0–3.0)
Eosinophils Absolute: 0.1 10*3/uL (ref 0.0–0.7)
Eosinophils Relative: 1.5 % (ref 0.0–5.0)
HCT: 38 % — ABNORMAL LOW (ref 39.0–52.0)
Hemoglobin: 12.3 g/dL — ABNORMAL LOW (ref 13.0–17.0)
Lymphocytes Relative: 36.2 % (ref 12.0–46.0)
Lymphs Abs: 1.4 10*3/uL (ref 0.7–4.0)
MCHC: 32.5 g/dL (ref 30.0–36.0)
MCV: 81.6 fl (ref 78.0–100.0)
Monocytes Absolute: 0.5 10*3/uL (ref 0.1–1.0)
Monocytes Relative: 13.6 % — ABNORMAL HIGH (ref 3.0–12.0)
Neutro Abs: 1.8 10*3/uL (ref 1.4–7.7)
Neutrophils Relative %: 46.6 % (ref 43.0–77.0)
Platelets: 222 10*3/uL (ref 150.0–400.0)
RBC: 4.66 Mil/uL (ref 4.22–5.81)
RDW: 14.3 % (ref 11.5–15.5)
WBC: 3.8 10*3/uL — ABNORMAL LOW (ref 4.0–10.5)

## 2020-10-10 LAB — FERRITIN: Ferritin: 6.2 ng/mL — ABNORMAL LOW (ref 22.0–322.0)

## 2020-10-10 LAB — IRON: Iron: 129 ug/dL (ref 42–165)

## 2020-10-10 NOTE — Addendum Note (Signed)
Addended by: Adah Salvage F on: 10/10/2020 10:06 AM   Modules accepted: Orders

## 2020-10-10 NOTE — Addendum Note (Signed)
Addended by: Zeven Kocak F on: 10/10/2020 10:06 AM   Modules accepted: Orders  

## 2020-10-10 NOTE — Addendum Note (Signed)
Addended by: Josie Mesa F on: 10/10/2020 10:06 AM   Modules accepted: Orders  

## 2020-10-12 ENCOUNTER — Other Ambulatory Visit: Payer: Self-pay | Admitting: Physician Assistant

## 2020-10-12 DIAGNOSIS — D649 Anemia, unspecified: Secondary | ICD-10-CM

## 2020-10-19 ENCOUNTER — Telehealth: Payer: Self-pay

## 2020-10-19 ENCOUNTER — Other Ambulatory Visit: Payer: Self-pay | Admitting: Physician Assistant

## 2020-10-19 MED ORDER — ARIPIPRAZOLE 5 MG PO TABS
2.5000 mg | ORAL_TABLET | Freq: Every day | ORAL | 3 refills | Status: DC
Start: 2020-10-19 — End: 2021-06-21

## 2020-10-19 NOTE — Telephone Encounter (Signed)
Pt called following up on his form for the RX Aripitrazole. Please advise.

## 2020-10-19 NOTE — Telephone Encounter (Signed)
Spoke with pt and he states he dropped off a letter from his insurance about possibly getting the medication cheaper since it is currently $411 which he cannot afford. Sam and I could not find the letter, please address when you return

## 2020-10-19 NOTE — Telephone Encounter (Signed)
Please advise, I could not find any form.

## 2020-10-19 NOTE — Telephone Encounter (Signed)
I have refilled abilify (Aripiprazole).   Please confirm with patient that this is the correct medication.

## 2020-10-22 ENCOUNTER — Telehealth: Payer: Self-pay

## 2020-10-22 NOTE — Telephone Encounter (Signed)
Paperwork for pt placed in your folder.

## 2020-10-22 NOTE — Telephone Encounter (Signed)
See other message

## 2020-10-22 NOTE — Telephone Encounter (Signed)
Pt dropped off paperwork ( placed in Samantha's folder). Pt is requesting Aripiprazole be added back to his drug list. If not on his list, this medication will cost him $400 a month

## 2020-10-23 ENCOUNTER — Other Ambulatory Visit (INDEPENDENT_AMBULATORY_CARE_PROVIDER_SITE_OTHER): Payer: Medicare Other

## 2020-10-23 DIAGNOSIS — D649 Anemia, unspecified: Secondary | ICD-10-CM

## 2020-10-23 LAB — FECAL OCCULT BLOOD, IMMUNOCHEMICAL: Fecal Occult Bld: NEGATIVE

## 2020-10-24 ENCOUNTER — Telehealth: Payer: Self-pay

## 2020-10-24 NOTE — Telephone Encounter (Signed)
Pt is following up on this paperwork and his prescription

## 2020-10-29 NOTE — Telephone Encounter (Signed)
Spoke to pt told him [name] filled out paperwork for his medication Aripiprazole 5 mg. Received fax from [facility] and it is now on your plan's list of covered drugs and does not need a prior authorization. Hopefully it will be fine now. Pt verbalized understanding.

## 2020-12-18 ENCOUNTER — Telehealth (INDEPENDENT_AMBULATORY_CARE_PROVIDER_SITE_OTHER): Payer: Medicare Other | Admitting: Physician Assistant

## 2020-12-18 ENCOUNTER — Encounter: Payer: Self-pay | Admitting: Physician Assistant

## 2020-12-18 ENCOUNTER — Ambulatory Visit: Payer: Medicare Other | Attending: Critical Care Medicine

## 2020-12-18 ENCOUNTER — Other Ambulatory Visit: Payer: Self-pay

## 2020-12-18 VITALS — Ht 72.0 in | Wt 150.0 lb

## 2020-12-18 DIAGNOSIS — F32A Depression, unspecified: Secondary | ICD-10-CM | POA: Insufficient documentation

## 2020-12-18 DIAGNOSIS — Z20822 Contact with and (suspected) exposure to covid-19: Secondary | ICD-10-CM

## 2020-12-18 DIAGNOSIS — R059 Cough, unspecified: Secondary | ICD-10-CM | POA: Diagnosis not present

## 2020-12-18 MED ORDER — AMOXICILLIN-POT CLAVULANATE 875-125 MG PO TABS
1.0000 | ORAL_TABLET | Freq: Two times a day (BID) | ORAL | 0 refills | Status: DC
Start: 2020-12-18 — End: 2021-02-26

## 2020-12-18 NOTE — Progress Notes (Signed)
Virtual Visit via Video   I connected with Andrew Benitez on 12/18/20 at  4:00 PM EDT by a video enabled telemedicine application and verified that I am speaking with the correct person using two identifiers. Location patient: Home Location provider: [facility], Office Persons participating in the virtual visit: Jaramiah, [place] PA-C, Anselmo Pickler, LPN   I discussed the limitations of evaluation and management by telemedicine and the availability of in person appointments. The patient expressed understanding and agreed to proceed.  I acted as a Education administrator for [employer], [employer], LPN   Subjective:   HPI:   Cough Pt c/o cough and sore throat x1 week.. Sore throat has essentially subsided.  He is coughing and expectorating yellow sputum. Nasal congestion and drainage has been yellow to bloody. No fever some chills. Pt has taken home COVID tests Sun and Friday both Negative. Pt is taking Mucinex BID.  He states that he has a history of allergies but does note that this is his first spring in [place].  He does not take any allergy medications at this time.  ROS: See pertinent positives and negatives per HPI.  Patient Active Problem List   Diagnosis Date Noted  . Depression     Social History   Tobacco Use  . Smoking status: Never Smoker  . Smokeless tobacco: Never Used  Substance Use Topics  . Alcohol use: Yes    Comment: one beer every 2 weeks    Current Outpatient Medications:  .  amoxicillin-clavulanate (AUGMENTIN) 875-125 MG tablet, Take 1 tablet by mouth 2 (two) times daily., Disp: 20 tablet, Rfl: 0 .  ARIPiprazole (ABILIFY) 5 MG tablet, Take 0.5 tablets (2.5 mg total) by mouth daily., Disp: 45 tablet, Rfl: 3 .  buPROPion (WELLBUTRIN XL) 300 MG 24 hr tablet, Take 1 tablet (300 mg total) by mouth daily., Disp: 90 tablet, Rfl: 3 .  DULoxetine (CYMBALTA) 30 MG capsule, Take 1 capsule (30 mg total) by mouth daily., Disp: 90 capsule, Rfl: 3 .   ferrous sulfate 325 (65 FE) MG tablet, Take 325 mg by mouth every other day., Disp: , Rfl:  .  FIBER PO, Take 1 each by mouth in the morning and at bedtime. Tablespoon, Disp: , Rfl:  .  ibuprofen (ADVIL) 200 MG tablet, Take 400 mg by mouth every 6 (six) hours as needed., Disp: , Rfl:  .  Omega-3 Fatty Acids (FISH OIL) 1000 MG CAPS, Take 1 capsule by mouth daily., Disp: , Rfl:   No Known Allergies  Objective:   VITALS: Per patient if applicable, see vitals. GENERAL: Alert, appears well and in no acute distress. HEENT: Atraumatic, conjunctiva clear, no obvious abnormalities on inspection of external nose and ears. NECK: Normal movements of the head and neck. CARDIOPULMONARY: No increased WOB. Speaking in clear sentences. I:E ratio WNL.  MS: Moves all visible extremities without noticeable abnormality. PSYCH: Pleasant and cooperative, well-groomed. Speech normal rate and rhythm. Affect is appropriate. Insight and judgement are appropriate. Attention is focused, linear, and appropriate.  NEURO: CN grossly intact. Oriented as arrived to appointment on time with no prompting. Moves both UE equally.  SKIN: No obvious lesions, wounds, erythema, or cyanosis noted on face or hands.  Assessment and Plan:   Aleczander was seen today for cough.  Diagnoses and all orders for this visit:  Cough  Other orders -     amoxicillin-clavulanate (AUGMENTIN) 875-125 MG tablet; Take 1 tablet by mouth 2 (two) times daily.  No red flags on discussion.  Will initiate Augmentin to cover for possible sinusitis per orders. Discussed taking medications as prescribed. Reviewed return precautions including worsening fever, SOB, worsening cough or other concerns. Push fluids and rest. I recommend that patient follow-up if symptoms worsen or persist despite treatment x 7-10 days, sooner if needed.  He is also going to come for Covid PCR testing out of an abundance of caution given he is going to be seeing his grandson in  2 days.  Also I recommended that if his symptoms linger after completion of antibiotic to consider daily antihistamine and/or nasal spray of his choice, my chart message sent with these recommendations.  I discussed the assessment and treatment plan with the patient. The patient was provided an opportunity to ask questions and all were answered. The patient agreed with the plan and demonstrated an understanding of the instructions.   The patient was advised to call back or seek an in-person evaluation if the symptoms worsen or if the condition fails to improve as anticipated.   CMA or LPN served as scribe during this visit. History, Physical, and Plan performed by medical provider. The above documentation has been reviewed and is accurate and complete.   [place], [place] 12/18/2020

## 2020-12-19 LAB — NOVEL CORONAVIRUS, NAA: SARS-CoV-2, NAA: NOT DETECTED

## 2020-12-19 LAB — SARS-COV-2, NAA 2 DAY TAT

## 2021-02-20 ENCOUNTER — Telehealth: Payer: PRIVATE HEALTH INSURANCE | Admitting: Family Medicine

## 2021-02-22 ENCOUNTER — Telehealth: Payer: Self-pay

## 2021-02-22 NOTE — Telephone Encounter (Signed)
Called patient and advised to go to local urgent care, patient states he understands and will head to one shortly.

## 2021-02-22 NOTE — Telephone Encounter (Signed)
Patient is calling in stating he has had a cold for over a week now, and has tried OTC medications. Wanting to know if he can come in for an appointment to see Dr.Parker. [name] has done an at home covid test two days ago and it has come back negative.

## 2021-02-22 NOTE — Telephone Encounter (Signed)
Danielle, please call pt and schedule with an available provider will probably need to be virtual. Dr. Jerline Pain has no openings today and pt should be seen before the weekend. If no one I available needs to go to Urgent care to be evaluated.

## 2021-02-26 ENCOUNTER — Telehealth (INDEPENDENT_AMBULATORY_CARE_PROVIDER_SITE_OTHER): Payer: Medicare Other | Admitting: Family Medicine

## 2021-02-26 ENCOUNTER — Encounter: Payer: Self-pay | Admitting: Family Medicine

## 2021-02-26 DIAGNOSIS — R059 Cough, unspecified: Secondary | ICD-10-CM

## 2021-02-26 MED ORDER — DOXYCYCLINE HYCLATE 100 MG PO TABS: 100.0000 mg | ORAL_TABLET | Freq: Two times a day (BID) | ORAL | 0 refills | Status: DC

## 2021-02-26 NOTE — Progress Notes (Signed)
Virtual Visit via Video Note  I connected with "Andrew Benitez"  on 02/26/21 at 12:00 PM EDT by a video enabled telemedicine application and verified that I am speaking with the correct person using two identifiers.  Location patient: home, [place] Location provider:work or home office Persons participating in the virtual visit: patient, provider  I discussed the limitations of evaluation and management by telemedicine and the availability of in person appointments. The patient expressed understanding and agreed to proceed.   HPI:  Acute telemedicine visit for cough and congestion: -Onset: 12 days ago; has had multiple negative covid test -Symptoms include: started out with nasal congestion, sore throat and cold - was seen at [facility] and was given albuterol, tessalon and a steroid  -cough is persistent and is coughing up mucus that is green, also has nasal congestion -Denies:fevers, SOB, CP, NVD, inability to eat/drink/get out of bed -Pertinent past medical history: see below -Pertinent medication allergies: No Known Allergies -COVID-19 vaccine status: vaccinated and boosted x 2  ROS: See pertinent positives and negatives per HPI.  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)    Past Surgical History:  Procedure Laterality Date   CYSTOSCOPY WITH INSERTION OF UROLIFT  2018   MASTECTOMY FOR GYNECOMASTIA Left 1983   SKIN CANCER EXCISION  2018   right side of neck and left forehead   TONSILLECTOMY       Current Outpatient Medications:    albuterol (VENTOLIN HFA) 108 (90 Base) MCG/ACT inhaler, Inhale 2 puffs into the lungs every 4 (four) hours as needed., Disp: , Rfl:    benzonatate (TESSALON) 100 MG capsule, Take by mouth., Disp: , Rfl:    buPROPion (WELLBUTRIN XL) 300 MG 24 hr tablet, Take 1 tablet (300 mg total) by mouth daily., Disp: 90 tablet, Rfl: 3   doxycycline (VIBRA-TABS)  100 MG tablet, Take 1 tablet (100 mg total) by mouth 2 (two) times daily., Disp: 14 tablet, Rfl: 0   DULoxetine (CYMBALTA) 30 MG capsule, Take 1 capsule (30 mg total) by mouth daily., Disp: 90 capsule, Rfl: 3   ferrous sulfate 325 (65 FE) MG tablet, Take 325 mg by mouth every other day., Disp: , Rfl:    FIBER PO, Take 1 each by mouth in the morning and at bedtime. Tablespoon, Disp: , Rfl:    ibuprofen (ADVIL) 200 MG tablet, Take 400 mg by mouth every 6 (six) hours as needed., Disp: , Rfl:    methylPREDNISolone (MEDROL DOSEPAK) 4 MG TBPK tablet, See admin instructions. follow package directions, Disp: , Rfl:    Omega-3 Fatty Acids (FISH OIL) 1000 MG CAPS, Take 1 capsule by mouth daily., Disp: , Rfl:    ARIPiprazole (ABILIFY) 5 MG tablet, Take 0.5 tablets (2.5 mg total) by mouth daily., Disp: 45 tablet, Rfl: 3  EXAM:  VITALS per patient if applicable:  GENERAL: alert, oriented, appears well and in no acute distress  HEENT: atraumatic, conjunttiva clear, no obvious abnormalities on inspection of external nose and ears  NECK: normal movements of the head and neck  LUNGS: on inspection no signs of respiratory distress, breathing rate appears normal, no obvious gross SOB, gasping or wheezing  CV: no obvious cyanosis  MS: moves all visible extremities without noticeable abnormality  PSYCH/NEURO: pleasant and cooperative, no obvious depression or anxiety, speech and thought processing grossly intact  ASSESSMENT AND PLAN:  Discussed the following assessment and plan:  Cough  -we discussed possible serious and likely etiologies, options for evaluation and workup, limitations of telemedicine visit vs in person visit, treatment, treatment risks and precautions. Pt prefers to treat via telemedicine empirically rather than in person at this moment.  Query resolving viral respiratory illness, versus bronchitis, versus sinusitis or developing lower respiratory illness versus other.  He opted for  treatment with doxycycline twice daily for 7 days in case of a CAP or sinusitis.  He is using over-the-counter cough medications. Work/School slipped offered: provided in patient instructions   Advised to seek prompt in person care if worsening, new symptoms arise, or if is not improving with treatment. Discussed options for inperson care if PCP office not available. Did let this patient know that I only do telemedicine on Tuesdays and Thursdays for [place]. Advised to schedule follow up visit with PCP or [facility] if any further questions or concerns to avoid delays in care.   I discussed the assessment and treatment plan with the patient. The patient was provided an opportunity to ask questions and all were answered. The patient agreed with the plan and demonstrated an understanding of the instructions.     Lucretia Kern, DO

## 2021-02-26 NOTE — Patient Instructions (Signed)
   ---------------------------------------------------------------------------------------------------------------------------      WORK SLIP:  Patient Andrew Benitez,  Mar 20, 1951, was seen for a medical visit today, 02/26/21 . Please excuse from work until symptoms have improved for 24 hours.   Sincerely: E-signature: Dr. Colin Benton, DO [place] Ph: [phone]   ------------------------------------------------------------------------------------------------------------------------------  -I sent the medication(s) we discussed to your pharmacy: Meds ordered this encounter  Medications   doxycycline (VIBRA-TABS) 100 MG tablet    Sig: Take 1 tablet (100 mg total) by mouth 2 (two) times daily.    Dispense:  14 tablet    Refill:  0     I hope you are feeling better soon!  Seek in person care promptly if your symptoms worsen, new concerns arise or you are not improving with treatment.  It was nice to meet you today. I help [facility] out with telemedicine visits on Tuesdays and Thursdays and am available for visits on those days. If you have any concerns or questions following this visit please schedule a follow up visit with your Primary Care doctor or seek care at a local urgent care clinic to avoid delays in care.

## 2021-03-05 ENCOUNTER — Encounter: Payer: Self-pay | Admitting: Family

## 2021-03-05 ENCOUNTER — Ambulatory Visit (INDEPENDENT_AMBULATORY_CARE_PROVIDER_SITE_OTHER): Payer: Medicare Other | Admitting: Family

## 2021-03-05 ENCOUNTER — Other Ambulatory Visit: Payer: Self-pay

## 2021-03-05 VITALS — BP 110/72 | HR 72 | Temp 98.2°F | Ht 72.0 in | Wt 152.6 lb

## 2021-03-05 DIAGNOSIS — R634 Abnormal weight loss: Secondary | ICD-10-CM

## 2021-03-05 DIAGNOSIS — Z8719 Personal history of other diseases of the digestive system: Secondary | ICD-10-CM | POA: Diagnosis not present

## 2021-03-05 NOTE — Patient Instructions (Signed)
Stool for Occult Blood Test Why am I having this test? Stool for occult blood, or fecal occult blood test (FOBT), is a test that is used to check for gastrointestinal (GI) bleeding, which may be a sign of colon cancer. This test can also detect small amounts of blood in your stool (feces) from other causes, such as ulcers, bleeding blood vessels, or hemorrhoids. This test may be done as part of an annual routine exam to screen for colorectal cancer. Screening is recommended for all adults at average riskstarting at age 44 and continuing until age 31. What is being tested? This test checks for blood in your stool. What kind of sample is taken?  A stool sample is required for this test. Your health care provider may collect the sample with a swab of the rectum. Or, you may be instructed to collect the sample in a container at home. If you are instructed to collect the sample at home, your health care provider will give you the instructions and suppliesthat you will need. How do I collect samples at home? You may be asked to collect stool samples at home. Follow instructions fromyour health care provider about how to collect the samples. When collecting a stool sample at home, make sure you: Use supplies and instructions that you received from the lab. Have a bowel movement directly into a clean, dry container. Do not collect stool from the water in the toilet. Transfer the sample into the germ-free (sterile) cup that you received from the lab. Do not let any toilet paper or urine get into the cup. Wash your hands with soap and water after collecting the sample. Make sure that the label on the specimen matches your legal, full name (do not use nicknames) and date of birth. Be sure to write your collection date and collection time clearly on the label with ink that will not smear. Return the samples to the lab as instructed. How do I prepare for this test? Do not eat any red meat during the 3 days  before your test. Follow instructions from your health care provider about eating and drinking before the test. Your health care provider may instruct you to avoid other foods or substances. Tell a health care provider about: All medicines you are taking, including vitamins, herbs, eye drops, creams, and over-the-counter medicines. You may be instructed to avoid certain medicines that are known to interfere with this test. Any recent dental procedures you have had. How are the results reported? Your test results will be reported as either positive or negative for blood inyour stool. What do the results mean? A negative test result means that there is no blood within the stool. Anegative result is considered normal. A positive test result may mean that there is blood in the stool. Causes of blood in the stool include: GI tumors. Certain GI diseases. GI trauma or recent surgery. Hemorrhoids. If your test result is positive, additional tests may be needed to find thesource of the bleeding. Talk with your health care provider about what your results mean. Questions to ask your health care provider Ask your health care provider, or the department that is doing the test: When will my results be ready? How will I get my results? What are my treatment options? What other tests do I need? What are my next steps? Summary Stool for occult blood, or fecal occult blood test (FOBT), is a test that is used to check for gastrointestinal (GI) bleeding, which may be  a sign of colon cancer. This test can also detect small amounts of blood in your stool (feces) from other causes, such as ulcers, bleeding blood vessels, or hemorrhoids. Your health care provider may collect the sample with a swab of the rectum. Or, you may be instructed to collect the sample in a container at home. A positive test result may mean that there is blood in the stool. This information is not intended to replace advice given to you  by your health care provider. Make sure you discuss any questions you have with your healthcare provider. Document Revised: 05/04/2020 Document Reviewed: 05/04/2020 Elsevier Patient Education  [place].

## 2021-03-06 LAB — CBC WITH DIFFERENTIAL/PLATELET
Basophils Absolute: 0.1 10*3/uL (ref 0.0–0.1)
Basophils Relative: 1.2 % (ref 0.0–3.0)
Eosinophils Absolute: 0.1 10*3/uL (ref 0.0–0.7)
Eosinophils Relative: 0.7 % (ref 0.0–5.0)
HCT: 39.5 % (ref 39.0–52.0)
Hemoglobin: 13.3 g/dL (ref 13.0–17.0)
Lymphocytes Relative: 22.3 % (ref 12.0–46.0)
Lymphs Abs: 1.7 10*3/uL (ref 0.7–4.0)
MCHC: 33.6 g/dL (ref 30.0–36.0)
MCV: 85 fl (ref 78.0–100.0)
Monocytes Absolute: 0.9 10*3/uL (ref 0.1–1.0)
Monocytes Relative: 11.7 % (ref 3.0–12.0)
Neutro Abs: 5 10*3/uL (ref 1.4–7.7)
Neutrophils Relative %: 64.1 % (ref 43.0–77.0)
Platelets: 375 10*3/uL (ref 150.0–400.0)
RBC: 4.65 Mil/uL (ref 4.22–5.81)
RDW: 13.8 % (ref 11.5–15.5)
WBC: 7.8 10*3/uL (ref 4.0–10.5)

## 2021-03-06 LAB — THYROID PANEL WITH TSH
Free Thyroxine Index: 1.6 (ref 1.4–3.8)
T3 Uptake: 35 % (ref 22–35)
T4, Total: 4.7 ug/dL — ABNORMAL LOW (ref 4.9–10.5)
TSH: 1.29 mIU/L (ref 0.40–4.50)

## 2021-03-06 NOTE — Progress Notes (Signed)
Acute Office Visit  Subjective:    Patient ID: Andrew Benitez, male    DOB: November 16, 1950, 70 y.o.   MRN: [id number]  Chief Complaint  Patient presents with  . Weight Loss    He denies abdominal pain, nausea or vomiting.     HPI Patient is in today with concerns of weight loss. He reports being concerned about colon cancer as he has a friend who died after battling colon cancer. He completed stool cards but reports being unsure that he did it correctly and would like to do it again. Last colonoscopy was 2 years ago in [place], [place] and was told to return in 5 years according to patient. Also had an endoscopy 3 years ago. Denies and blood in his stools or black stools.   Past Medical History:  Diagnosis Date  . Depression   . History of shingles 1990  . 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 CYSTOSCOPY WITH INSERTION OF UROLIFT  2018  . MASTECTOMY FOR GYNECOMASTIA Left 1983  . SKIN CANCER EXCISION  2018   right side of neck and left forehead  . TONSILLECTOMY      Family History  Problem Relation Age of Onset  . Alcohol abuse Mother   . Arthritis Mother   . COPD Mother   . Alcohol abuse Father   . Depression Father   . Breast cancer Sister   . Alcohol abuse Sister   . Depression Sister   . Asthma Sister   . Hyperlipidemia Sister   . Heart attack Paternal Grandmother     Social History   Socioeconomic History  . Marital status: Married    Spouse name: Not on file  . Number of children: Not on file  . Years of education: Not on file  . Highest education level: Not on file  Occupational History  . Not on file  Tobacco Use  . Smoking status: Never  . Smokeless tobacco: Never  Vaping Use  . Vaping Use: Never used  Substance and Sexual Activity  . Alcohol use: Yes    Comment: one beer every 2 weeks  . Drug use: Yes    Types: Marijuana    Comment: once a week  . Sexual  activity: Not Currently  Other Topics Concern  . 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 ARIPiprazole (ABILIFY) 5 MG tablet Take 0.5 tablets (2.5 mg total) by mouth daily. 45 tablet 3  . buPROPion (WELLBUTRIN XL) 300 MG 24 hr tablet Take 1 tablet (300 mg total) by mouth daily. 90 tablet 3  . DULoxetine (CYMBALTA) 30 MG capsule Take 1 capsule (30 mg total) by mouth daily. 90 capsule 3  . ferrous sulfate 325 (65 FE) MG tablet Take 325 mg by mouth every other day.    Marland Kitchen FIBER PO Take 1 each by mouth in the morning and at bedtime. Tablespoon    . Omega-3 Fatty  Acids (FISH OIL) 1000 MG CAPS Take 1 capsule by mouth daily.    Marland Kitchen albuterol (VENTOLIN HFA) 108 (90 Base) MCG/ACT inhaler Inhale 2 puffs into the lungs every 4 (four) hours as needed. (Patient not taking: Reported on 03/05/2021)    . benzonatate (TESSALON) 100 MG capsule Take by mouth. (Patient not taking: Reported on 03/05/2021)    . doxycycline (VIBRA-TABS) 100 MG tablet Take 1 tablet (100 mg total) by mouth 2 (two) times daily. (Patient not taking: Reported on 03/05/2021) 14 tablet 0  . ibuprofen (ADVIL) 200 MG tablet Take 400 mg by mouth every 6 (six) hours as needed. (Patient not taking: Reported on 03/05/2021)    . methylPREDNISolone (MEDROL DOSEPAK) 4 MG TBPK tablet See admin instructions. follow package directions (Patient not taking: Reported on 03/05/2021)     No facility-administered medications prior to visit.    No Known Allergies  Review of Systems  Constitutional:  Positive for unexpected weight change.  HENT:  Negative.    Respiratory: Negative.    Cardiovascular: Negative.   Gastrointestinal:  Negative for abdominal distention, abdominal pain, anal bleeding, blood in stool, constipation, diarrhea, nausea and vomiting.  Endocrine: Negative.   Genitourinary: Negative.   Musculoskeletal: Negative.   Skin: Negative.   Allergic/Immunologic: Negative.   Neurological: Negative.   Psychiatric/Behavioral: Negative.    All other systems reviewed and are negative.     Objective:    Physical Exam Vitals and nursing note reviewed.  Constitutional:      Appearance: Normal appearance.     Comments: Weight down 3 pounds from last office visit in October 2021  Cardiovascular:     Rate and Rhythm: Normal rate and regular rhythm.     Pulses: Normal pulses.     Heart sounds: Normal heart sounds.  Pulmonary:     Effort: Pulmonary effort is normal.     Breath sounds: Normal breath sounds.  Abdominal:     General: Abdomen is flat. Bowel sounds are normal.     Palpations: Abdomen is soft.     Tenderness: There is no guarding or rebound.  Musculoskeletal:        General: Normal range of motion.     Cervical back: Normal range of motion and neck supple.  Skin:    General: Skin is warm and dry.  Neurological:     General: No focal deficit present.     Mental Status: He is alert and oriented to person, place, and time.  Psychiatric:        Mood and Affect: Mood normal.        Behavior: Behavior normal.    BP 110/72   Pulse 72   Temp 98.2 F (36.8 C) (Temporal)   Ht 6' (1.829 m)   Wt 152 lb 9.6 oz (69.2 kg)   SpO2 99%   BMI 20.70 kg/m  Wt Readings from Last 3 Encounters:  03/05/21 152 lb 9.6 oz (69.2 kg)  12/18/20 150 lb (68 kg)  07/06/20 155 lb 6.1 oz (70.5 kg)    Health Maintenance Due  Topic Date Due  . Zoster Vaccines- Shingrix (2 of 2) 07/07/2018    There are no preventive care reminders to display for this patient.   Lab Results  Component Value Date   TSH 1.29 03/05/2021    Lab Results  Component Value Date   WBC 7.8 03/05/2021   HGB 13.3 03/05/2021   HCT 39.5 03/05/2021   MCV 85.0 03/05/2021   PLT 375.0 03/05/2021  Lab Results  Component Value Date   NA 139 07/06/2020   K 5.3 07/06/2020   CO2 27 07/06/2020   GLUCOSE 92 07/06/2020   BUN 17 07/06/2020   CREATININE 1.00 07/06/2020   BILITOT 0.4 10/10/2020   ALKPHOS 61 10/10/2020   AST 19 10/10/2020   ALT 19 10/10/2020   PROT 6.6 10/10/2020   ALBUMIN 4.2 10/10/2020   CALCIUM 9.1 07/06/2020   Lab Results  Component Value Date   CHOL 146 07/06/2020   Lab Results  Component Value Date   HDL 54 07/06/2020   Lab Results  Component Value Date   LDLCALC 79 07/06/2020   Lab Results  Component Value Date   TRIG 51 07/06/2020   Lab Results  Component Value Date   CHOLHDL 2.7 07/06/2020   Lab Results  Component Value Date   HGBA1C 5.4 06/01/2019       Assessment & Plan:   Problem List Items Addressed This Visit   None Visit Diagnoses     Weight loss    -  Primary   Relevant Orders   Thyroid Panel With TSH (Completed)   CBC with Differential/Platelet (Completed)   History of bloody stools       Relevant Orders   Thyroid Panel With TSH (Completed)   CBC with Differential/Platelet (Completed)        Plan: Stool cards provided for patient to return to screen for blood. If blood is present will refer to GI. Weight loss according to our records is not significant. However patient weighing less at home. Will continue to monitor.   Kennyth Arnold, FNP

## 2021-03-07 ENCOUNTER — Other Ambulatory Visit (INDEPENDENT_AMBULATORY_CARE_PROVIDER_SITE_OTHER): Payer: Medicare Other

## 2021-03-07 ENCOUNTER — Other Ambulatory Visit: Payer: Medicare Other

## 2021-03-07 DIAGNOSIS — Z8719 Personal history of other diseases of the digestive system: Secondary | ICD-10-CM

## 2021-03-07 LAB — FECAL OCCULT BLOOD, IMMUNOCHEMICAL: Fecal Occult Bld: NEGATIVE

## 2021-03-07 NOTE — Addendum Note (Signed)
Addended by: Jacob Moores on: 03/07/2021 04:18 PM   Modules accepted: Orders

## 2021-05-29 ENCOUNTER — Ambulatory Visit (INDEPENDENT_AMBULATORY_CARE_PROVIDER_SITE_OTHER): Payer: Medicare Other | Admitting: *Deleted

## 2021-05-29 DIAGNOSIS — Z0000 Encounter for general adult medical examination without abnormal findings: Secondary | ICD-10-CM | POA: Diagnosis not present

## 2021-05-29 NOTE — Patient Instructions (Signed)
Health Maintenance, Male Adopting a healthy lifestyle and getting preventive care are important in promoting health and wellness. Ask your health care provider about: The right schedule for you to have regular tests and exams. Things you can do on your own to prevent diseases and keep yourself healthy. What should I know about diet, weight, and exercise? Eat a healthy diet  Eat a diet that includes plenty of vegetables, fruits, low-fat dairy products, and lean protein. Do not eat a lot of foods that are high in solid fats, added sugars, or sodium. Maintain a healthy weight Body mass index (BMI) is a measurement that can be used to identify possible weight problems. It estimates body fat based on height and weight. Your health care provider can help determine your BMI and help you achieve or maintain a healthy weight. Get regular exercise Get regular exercise. This is one of the most important things you can do for your health. Most adults should: Exercise for at least 150 minutes each week. The exercise should increase your heart rate and make you sweat (moderate-intensity exercise). Do strengthening exercises at least twice a week. This is in addition to the moderate-intensity exercise. Spend less time sitting. Even light physical activity can be beneficial. Watch cholesterol and blood lipids Have your blood tested for lipids and cholesterol at 70 years of age, then have this test every 5 years. You may need to have your cholesterol levels checked more often if: Your lipid or cholesterol levels are high. You are older than 70 years of age. You are at high risk for heart disease. What should I know about cancer screening? Many types of cancers can be detected early and may often be prevented. Depending on your health history and family history, you may need to have cancer screening at various ages. This may include screening for: Colorectal cancer. Prostate cancer. Skin cancer. Lung  cancer. What should I know about heart disease, diabetes, and high blood pressure? Blood pressure and heart disease High blood pressure causes heart disease and increases the risk of stroke. This is more likely to develop in people who have high blood pressure readings, are of African descent, or are overweight. Talk with your health care provider about your target blood pressure readings. Have your blood pressure checked: Every 3-5 years if you are 18-39 years of age. Every year if you are 40 years old or older. If you are between the ages of 65 and 75 and are a current or former smoker, ask your health care provider if you should have a one-time screening for abdominal aortic aneurysm (AAA). Diabetes Have regular diabetes screenings. This checks your fasting blood sugar level. Have the screening done: Once every three years after age 45 if you are at a normal weight and have a low risk for diabetes. More often and at a younger age if you are overweight or have a high risk for diabetes. What should I know about preventing infection? Hepatitis B If you have a higher risk for hepatitis B, you should be screened for this virus. Talk with your health care provider to find out if you are at risk for hepatitis B infection. Hepatitis C Blood testing is recommended for: Everyone born from 1945 through 1965. Anyone with known risk factors for hepatitis C. Sexually transmitted infections (STIs) You should be screened each year for STIs, including gonorrhea and chlamydia, if: You are sexually active and are younger than 70 years of age. You are older than 70 years   of age and your health care provider tells you that you are at risk for this type of infection. Your sexual activity has changed since you were last screened, and you are at increased risk for chlamydia or gonorrhea. Ask your health care provider if you are at risk. Ask your health care provider about whether you are at high risk for HIV.  Your health care provider may recommend a prescription medicine to help prevent HIV infection. If you choose to take medicine to prevent HIV, you should first get tested for HIV. You should then be tested every 3 months for as long as you are taking the medicine. Follow these instructions at home: Lifestyle Do not use any products that contain nicotine or tobacco, such as cigarettes, e-cigarettes, and chewing tobacco. If you need help quitting, ask your health care provider. Do not use street drugs. Do not share needles. Ask your health care provider for help if you need support or information about quitting drugs. Alcohol use Do not drink alcohol if your health care provider tells you not to drink. If you drink alcohol: Limit how much you have to 0-2 drinks a day. Be aware of how much alcohol is in your drink. In the [place] one drink equals one 12 oz bottle of beer (355 mL), one 5 oz glass of wine (148 mL), or one 1 oz glass of hard liquor (44 mL). General instructions Schedule regular health, dental, and eye exams. Stay current with your vaccines. Tell your health care provider if: You often feel depressed. You have ever been abused or do not feel safe at home. Summary Adopting a healthy lifestyle and getting preventive care are important in promoting health and wellness. Follow your health care provider's instructions about healthy diet, exercising, and getting tested or screened for diseases. Follow your health care provider's instructions on monitoring your cholesterol and blood pressure. This information is not intended to replace advice given to you by your health care provider. Make sure you discuss any questions you have with your health care provider. Document Revised: 11/09/2020 Document Reviewed: 08/25/2018 Elsevier Patient Education  2022 Elsevier Inc.  

## 2021-05-29 NOTE — Progress Notes (Signed)
Subjective:   Andrew Benitez is a 70 y.o. male who presents for Medicare Annual/Subsequent preventive examination.  I connected with  Andrew Benitez on 05/29/21 by a audio enabled telemedicine application and verified that I am speaking with the correct person using two identifiers.   I discussed the limitations of evaluation and management by telemedicine. The patient expressed understanding and agreed to proceed.   Location of Patient: Home Location of provider: Office Persons participating in the visit: Andrew Benitez, Andrew O'Neal,RN  Review of Systems    Defer to PCP Cardiac Risk Factors include: advanced age (>80mn, >>78women)     Objective:    There were no vitals filed for this visit. There is no height or weight on file to calculate BMI.  Advanced Directives 05/29/2021  Does Patient Have a Medical Advance Directive? Yes  Type of [place]amed[place] will  Copy of HMasary[place]ownin Chart? No - copy requested    Current Medications (verified) Outpatient Encounter Medications as of 05/29/2021  Medication Sig   albuterol (VENTOLIN HFA) 108 (90 Base) MCG/ACT inhaler Inhale 2 puffs into the lungs every 4 (four) hours as needed. (Patient not taking: Reported on 03/05/2021)   ARIPiprazole (ABILIFY) 5 MG tablet Take 0.5 tablets (2.5 mg total) by mouth daily.   benzonatate (TESSALON) 100 MG capsule Take by mouth. (Patient not taking: Reported on 03/05/2021)   buPROPion (WELLBUTRIN XL) 300 MG 24 hr tablet Take 1 tablet (300 mg total) by mouth daily.   doxycycline (VIBRA-TABS) 100 MG tablet Take 1 tablet (100 mg total) by mouth 2 (two) times daily. (Patient not taking: Reported on 03/05/2021)   DULoxetine (CYMBALTA) 30 MG capsule Take 1 capsule (30 mg total) by mouth daily.   ferrous sulfate 325 (65 FE) MG tablet Take 325 mg by mouth every other day.   FIBER PO Take 1 each by mouth in the morning and at bedtime. Tablespoon   ibuprofen  (ADVIL) 200 MG tablet Take 400 mg by mouth every 6 (six) hours as needed. (Patient not taking: Reported on 03/05/2021)   methylPREDNISolone (MEDROL DOSEPAK) 4 MG TBPK tablet See admin instructions. follow package directions (Patient not taking: Reported on 03/05/2021)   Omega-3 Fatty Acids (FISH OIL) 1000 MG CAPS Take 1 capsule by mouth daily.   No facility-administered encounter medications on file as of 05/29/2021.    Allergies (verified) Patient has no known allergies.   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)   Past Surgical History:  Procedure Laterality Date   CYSTOSCOPY WITH INSERTION OF UROLIFT  2018   MASTECTOMY FOR GYNECOMASTIA Left 1983   SKIN CANCER EXCISION  2018   right side of neck and left forehead   TONSILLECTOMY     Family History  Problem Relation Age of Onset   Alcohol abuse Mother    Arthritis Mother    COPD Mother    Alcohol abuse Father    Depression Father    Breast cancer Sister    Alcohol abuse Sister    Depression Sister    Asthma Sister    Hyperlipidemia Sister    Heart attack Paternal Grandmother    Social History   Socioeconomic History   Marital status: Married    Spouse name: Not on file   Number of children: Not on file  Years of education: Not on file   Highest education level: Not on file  Occupational History   Not on file  Tobacco Use   Smoking status: Never   Smokeless tobacco: Never  Vaping Use   Vaping Use: Never used  Substance and Sexual Activity   Alcohol use: Yes    Comment: one beer every  week   Drug use: Yes    Types: Marijuana    Comment: once a week   Sexual activity: Not Currently  Other Topics Concern   Not on file  Social History Narrative   Married   Two children, one in [place]   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   Social Determinants of Radio broadcast assistant Strain:  Low Risk    Difficulty of Paying Living Expenses: Not hard at all  Food Insecurity: No Food Insecurity   Worried About Charity fundraiser in the Last Year: Never true   Arboriculturist in the Last Year: Never true  Transportation Needs: No Transportation Needs   Lack of Transportation (Medical): No   Lack of Transportation (Non-Medical): No  Physical Activity: Insufficiently Active   Days of Exercise per Week: 7 days   Minutes of Exercise per Session: 20 min  Stress: No Stress Concern Present   Feeling of Stress : Not at all  Social Connections: Moderately Integrated   Frequency of Communication with Friends and Family: More than three times a week   Frequency of Social Gatherings with Friends and Family: Once a week   Attends Religious Services: Never   Marine scientist or Organizations: Yes   Attends Music therapist: More than 4 times per year   Marital Status: Married    Tobacco Counseling Counseling given: Not Answered   Clinical Intake:     Pain : No/denies pain     Nutritional Risks: None Diabetes: No  How often do you need to have someone help you when you read instructions, pamphlets, or other written materials from your doctor or pharmacy?: 1 - Never What is the last grade level you completed in school?: 16 years  Diabetic?No  Interpreter Needed?: No  Information entered by :: Andrew Malta O'Neal,RN   Activities of Daily Living In your present state of health, do you have any difficulty performing the following activities: 05/29/2021 07/06/2020  Hearing? N Y  Vision? N N  Comment - glasses  Difficulty concentrating or making decisions? N N  Walking or climbing stairs? N N  Dressing or bathing? N N  Doing errands, shopping? N N  Preparing Food and eating ? Y -  Using the Toilet? Y -  In the past six months, have you accidently leaked urine? N -  Do you have problems with loss of bowel control? N -  Managing your Medications? Y -   Managing your Finances? Y -  Housekeeping or managing your Housekeeping? Y -  Some recent data might be hidden    Patient Care Team: Andrew Benitez, [place] as PCP - General (Physician Assistant)  Indicate any recent Medical Services you may have received from other than [facility] providers in the past year (date may be approximate).     Assessment:   This is a routine wellness examination for Andrew Benitez.  Hearing/Vision screen No results found.  Dietary issues and exercise activities discussed: Current Exercise Habits: Home exercise routine, Type of exercise: walking, Time (Minutes): 20, Frequency (Times/Week): 7, Weekly Exercise (Minutes/Week): 140, Intensity: Moderate, Exercise limited by:  None identified   Goals Addressed             This Visit's Progress    Activity and Exercise Increased       Evidence-based guidance:  Review current exercise levels.  Assess patient perspective on exercise or activity level, barriers to increasing activity, motivation and readiness for change.  Recommend or set healthy exercise goal based on individual tolerance.  Encourage small steps toward making change in amount of exercise or activity.  Urge reduction of sedentary activities or screen time.  Promote group activities within the community or with family or support person.  Consider referral to rehabiliation therapist for assessment and exercise/activity plan.   Notes:       Depression Screen PHQ 2/9 Scores 05/29/2021 05/29/2021 03/05/2021 07/06/2020 06/20/2020  PHQ - 2 Score 0 1 0 0 0  PHQ- 9 Score - - - 0 0    Fall Risk Fall Risk  05/29/2021  Falls in the past year? 1  Number falls in past yr: 0  Injury with Fall? 0  Risk for fall due to : No Fall Risks    FALL RISK PREVENTION PERTAINING TO THE HOME:  Any stairs in or around the home? Yes  If so, are there any without handrails? Yes  Home free of loose throw rugs in walkways, pet beds, electrical cords, etc? Yes  Adequate lighting  in your home to reduce risk of falls? Yes   ASSISTIVE DEVICES UTILIZED TO PREVENT FALLS:  Life alert? No  Use of a cane, walker or w/c? No  Grab bars in the bathroom? No  Shower chair or bench in shower? No  Elevated toilet seat or a handicapped toilet? No   TIMED UP AND GO:  Was the test performed? No .  Length of time to ambulate 10 feet: NA sec.     Cognitive Function:     6CIT Screen 05/29/2021  What Year? 0 points  What month? 0 points  What time? 0 points  Count back from 20 0 points  Months in reverse 0 points  Repeat phrase 0 points  Total Score 0    Immunizations Immunization History  Administered Date(s) Administered   Fluad Quad(high Dose 65+) 07/06/2020   Influenza, High Dose Seasonal PF 05/12/2019   PFIZER(Purple Top)SARS-COV-2 Vaccination 11/19/2019, 12/10/2019, 06/18/2020, 01/26/2021   Pneumococcal Conjugate-13 05/11/2017   Pneumococcal Polysaccharide-23 06/24/2018   Tdap 05/08/2016   Zoster Recombinat (Shingrix) 05/12/2018    TDAP status: Up to date  Flu Vaccine status: Due, Education has been provided regarding the importance of this vaccine. Advised may receive this vaccine at local pharmacy or Health Dept. Aware to provide a copy of the vaccination record if obtained from local pharmacy or Health Dept. Verbalized acceptance and understanding.  Pneumococcal vaccine status: Up to date  Covid-19 vaccine status: Completed vaccines  Qualifies for Shingles Vaccine? Yes   Zostavax completed No   Shingrix Completed?: No.    Education has been provided regarding the importance of this vaccine. Patient has been advised to call insurance company to determine out of pocket expense if they have not yet received this vaccine. Advised may also receive vaccine at local pharmacy or Health Dept. Verbalized acceptance and understanding.  Screening Tests Health Maintenance  Topic Date Due   Zoster Vaccines- Shingrix (2 of 2) 07/07/2018   INFLUENZA VACCINE   04/15/2021   TETANUS/TDAP  05/08/2026   COLONOSCOPY (Pts 45-1yr Insurance coverage will need to be confirmed)  11/06/2026  COVID-19 Vaccine  Completed   Hepatitis C Screening  Completed   PNA vac Low Risk Adult  Completed   HPV VACCINES  Aged Out    Health Maintenance  Health Maintenance Due  Topic Date Due   Zoster Vaccines- Shingrix (2 of 2) 07/07/2018   INFLUENZA VACCINE  04/15/2021    Colorectal cancer screening: Type of screening: Colonoscopy. Completed  . Repeat every 10 years  Lung Cancer Screening: (Low Dose CT Chest recommended if Age 58-80 years, 30 pack-year currently smoking OR have quit w/in 15years.) does not qualify.   Lung Cancer Screening Referral: NA  Additional Screening:  Hepatitis C Screening: does not qualify; Completed Yes  Vision Screening: Recommended annual ophthalmology exams for early detection of glaucoma and other disorders of the eye. Is the patient up to date with their annual eye exam?  Yes  Who is the provider or what is the name of the office in which the patient attends annual eye exams? [facility] If pt is not established with a provider, would they like to be referred to a provider to establish care? No .   Dental Screening: Recommended annual dental exams for proper oral hygiene  Community Resource Referral / Chronic Care Management: CRR required this visit?  No   CCM required this visit?  No      Plan:     I have personally reviewed and noted the following in the patient's chart:   Medical and social history Use of alcohol, tobacco or illicit drugs  Current medications and supplements including opioid prescriptions. Patient is not currently taking opioid prescriptions. Functional ability and status Nutritional status Physical activity Advanced directives List of other physicians Hospitalizations, surgeries, and ER visits in previous 12 months Vitals Screenings to include cognitive, depression, and  falls Referrals and appointments  In addition, I have reviewed and discussed with patient certain preventive protocols, quality metrics, and best practice recommendations. A written personalized care plan for preventive services as well as general preventive health recommendations were provided to patient.   Instructed patient he could get his flu vaccine, with his annual exam scheduled for Oct 24th.  Also he is interested in getting 2nd shingles vaccine and they offer those at local pharmacies   Andrew Benitez, [place]   05/29/2021   Nurse Notes: Non-Face to Face 40 minute visit  .. Mr. Mcmaken , Thank you for taking time to come for your Medicare Wellness Visit. I appreciate your ongoing commitment to your health goals. Please review the following plan we discussed and let me know if I can assist you in the future.   These are the goals we discussed:  Goals      Activity and Exercise Increased     Evidence-based guidance:  Review current exercise levels.  Assess patient perspective on exercise or activity level, barriers to increasing activity, motivation and readiness for change.  Recommend or set healthy exercise goal based on individual tolerance.  Encourage small steps toward making change in amount of exercise or activity.  Urge reduction of sedentary activities or screen time.  Promote group activities within the community or with family or support person.  Consider referral to rehabiliation therapist for assessment and exercise/activity plan.   Notes:         This is a list of the screening recommended for you and due dates:  Health Maintenance  Topic Date Due   Zoster (Shingles) Vaccine (2 of 2) 07/07/2018   Flu Shot  04/15/2021  Tetanus Vaccine  05/08/2026   Colon Cancer Screening  11/06/2026   COVID-19 Vaccine  Completed   Hepatitis C Screening: USPSTF Recommendation to screen - Ages 23-79 yo.  Completed   Pneumonia vaccines  Completed   HPV Vaccine  Aged Out

## 2021-06-21 ENCOUNTER — Other Ambulatory Visit: Payer: Self-pay

## 2021-06-21 MED ORDER — ARIPIPRAZOLE 5 MG PO TABS: 2.5000 mg | ORAL_TABLET | Freq: Every day | ORAL | 1 refills | Status: DC

## 2021-06-21 MED ORDER — BUPROPION HCL ER (XL) 300 MG PO TB24: 300.0000 mg | ORAL_TABLET | Freq: Every day | ORAL | 0 refills | Status: DC

## 2021-06-21 MED ORDER — DULOXETINE HCL 30 MG PO CPEP: 30.0000 mg | ORAL_CAPSULE | Freq: Every day | ORAL | 0 refills | Status: DC

## 2021-06-21 NOTE — Telephone Encounter (Signed)
LAST APPOINTMENT DATE:  03/05/21  NEXT APPOINTMENT DATE: 11/2/222  MEDICATION:ARIPiprazole (ABILIFY) 5 MG tablet (Expired)  buPROPion (WELLBUTRIN XL) 300 MG 24 hr tablet  DULoxetine (CYMBALTA) 30 MG capsule PHARMACY: [facility]

## 2021-06-21 NOTE — Telephone Encounter (Signed)
Pt requesting refills. Last OV 03/05/2021. Next appt with you on 11/2/202. Okay to send?

## 2021-06-26 MED ORDER — BUPROPION HCL ER (XL) 300 MG PO TB24: 300.0000 mg | ORAL_TABLET | Freq: Every day | ORAL | 1 refills | Status: DC

## 2021-06-26 MED ORDER — ARIPIPRAZOLE 5 MG PO TABS: 2.5000 mg | ORAL_TABLET | Freq: Every day | ORAL | 1 refills | Status: DC

## 2021-06-26 MED ORDER — DULOXETINE HCL 30 MG PO CPEP: 30.0000 mg | ORAL_CAPSULE | Freq: Every day | ORAL | 1 refills | Status: DC

## 2021-06-26 NOTE — Telephone Encounter (Signed)
Pt requesting Rx's be sent to mail order pharmacy. Cart loaded, please send.

## 2021-06-26 NOTE — Telephone Encounter (Signed)
Pt called stating that he wants his prescriptions sent to [facility] now instead of going through [facility]. Andrew Benitez would like a call after the prescriptions are sent to [facility]. Please Advise.

## 2021-06-26 NOTE — Telephone Encounter (Signed)
Left detailed message on personal voicemail Rx's were sent to [facility] home mail order as requested. Any questions please call office.

## 2021-06-26 NOTE — Addendum Note (Signed)
Addended by: Marian Sorrow on: 06/26/2021 01:07 PM   Modules accepted: Orders

## 2021-07-08 ENCOUNTER — Encounter: Payer: Medicare Other | Admitting: Physician Assistant

## 2021-07-16 NOTE — Progress Notes (Signed)
Subjective:    Andrew Benitez is a 69 y.o. male and is here for follow-up   HPI  Health Maintenance Due  Topic Date Due   Zoster Vaccines- Shingrix (2 of 2) 07/07/2018    Acute Concerns: Hair Loss Andrew Benitez expresses he has been experiencing hair loss that he believes to be caused by age. He would like to explore some options to increase hair growth or slow current hair loss. TSH levels will be checked to rule out hypothyroid being a cause.   Nocturia Andrew Benitez is interested in continuing to check his PSA yearly due to concerns for having an enlarged prostate. He has been experiencing his stream losing pressure and urinary frequency. Denies urinary incontinence.   Chronic Issues: Depression/High risk medication use Currently compliant with taking Wellbutrin XL 300 mg and Cymbalta 30 mg daily as well as Abilify 2.5 mg every other day with no adverse effects. Although he believes the medication combination limits his emotional highs and lows, he is managing well. No SI/HI.   Iron Deficiency Currently compliant with taking ferrous sulfate 325 mg every other day with no adverse effects. Has had significant work-up from GI in the past. Has found no evidence of bleeding and thought to be hemorrhoidal.  Health Maintenance: Immunizations -- COVID- Last completed 01/26/21 Therapist, music- 4 doses) Tdap- Last completed 05/08/16 Influenza Vaccine- Last completed 07/06/20 Colonoscopy -- Last completed 11/06/16 PSA --  Lab Results  Component Value Date   PSA 0.51 07/06/2020   PSA 1.158 06/01/2019   Diet -- Recently switched to diet Benitez once a day, increased water intake, but tries to eat healthy with occasional sweets. Sleep habits -- Stable Exercise -- Walks daily and tries to reach 5000 steps a day. Also volunteers at [employer] twice a week. Ophthalmology- UTD Dentistry- UTD Weight -- Stable Recent weight history Wt Readings from Last 10 Encounters:  07/17/21 157 lb 6.1 oz (71.4  kg)  03/05/21 152 lb 9.6 oz (69.2 kg)  12/18/20 150 lb (68 kg)  07/06/20 155 lb 6.1 oz (70.5 kg)  06/20/20 157 lb (71.2 kg)   Body mass index is 21.64 kg/m. Mood -- Stable Alcohol use --  reports current alcohol use of about 1.0 standard drink per week.  Tobacco use -- Still engages in smoking marijuana about 3 times a week; has tried to switch over to marijuana gummies.  Tobacco Use: Low Risk    Smoking Tobacco Use: Never   Smokeless Tobacco Use: Never   Passive Exposure: Not on file     Depression screen [facility] 2/9 07/17/2021  Decreased Interest 0  Down, Depressed, Hopeless 0  PHQ - 2 Score 0  Altered sleeping 0  Tired, decreased energy 0  Change in appetite 0  Feeling bad or failure about yourself  1  Trouble concentrating 0  Moving slowly or fidgety/restless 0  Suicidal thoughts 0  PHQ-9 Score 1  Difficult doing work/chores Not difficult at all    Other providers/specialists: Patient Care Team: Andrew Benitez, [place] as PCP - General (Physician Assistant)    PMHx, SurgHx, SocialHx, Medications, and Allergies were reviewed in the Visit Navigator and updated as appropriate.   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)     Past Surgical History:  Procedure Laterality Date   BREAST SURGERY  02/24/1981   Gynecomastia; date  is an estimate   CYSTOSCOPY WITH INSERTION OF UROLIFT  2018   MASTECTOMY FOR GYNECOMASTIA Left 1983   SKIN CANCER EXCISION  2018   right side of neck and left forehead   TONSILLECTOMY       Family History  Problem Relation Age of Onset   Alcohol abuse Mother    Arthritis Mother    COPD Mother    Vision loss Mother    Alcohol abuse Father    Depression Father    Early death Father    Breast cancer Sister    Alcohol abuse Sister    Depression Sister    Asthma Sister    Hyperlipidemia Sister    Heart attack Paternal  Grandmother     Social History   Tobacco Use   Smoking status: Never   Smokeless tobacco: Never  Vaping Use   Vaping Use: Never used  Substance Use Topics   Alcohol use: Yes    Alcohol/week: 1.0 standard drink    Types: 1 Cans of beer per week    Comment: one beer every  week   Drug use: Yes    Frequency: 2.0 times per week    Types: Marijuana    Comment: once a week    Review of Systems:   Review of Systems  Constitutional:  Negative for chills, fever, malaise/fatigue and weight loss.  HENT:  Negative for hearing loss, sinus pain and sore throat.   Respiratory:  Negative for cough and hemoptysis.   Cardiovascular:  Negative for chest pain, palpitations, leg swelling and PND.  Gastrointestinal:  Negative for abdominal pain, constipation, diarrhea, heartburn, nausea and vomiting.  Genitourinary:  Negative for dysuria, frequency and urgency.  Musculoskeletal:  Negative for back pain, myalgias and neck pain.  Skin:  Negative for itching and rash.  Neurological:  Negative for dizziness, tingling, seizures and headaches.  Endo/Heme/Allergies:  Negative for polydipsia.  Psychiatric/Behavioral:  Negative for depression. The patient is not nervous/anxious.    Objective:    Vitals:   07/17/21 1034  BP: 100/60  Pulse: 60  Temp: 97.9 F (36.6 C)  SpO2: 98%   Body mass index is 21.64 kg/m.  General  Alert, cooperative, no distress, appears stated age  Head:  Normocephalic, without obvious abnormality, atraumatic  Eyes:  PERRL, conjunctiva/corneas clear, EOM's intact, fundi benign, both eyes       Ears:  Normal TM's and external ear canals, both ears  Nose: Nares normal, septum midline, mucosa normal, no drainage or sinus tenderness  Throat: Lips, mucosa, and tongue normal; teeth and gums normal  Neck: Supple, symmetrical, trachea midline, no adenopathy;     thyroid:  No enlargement/tenderness/nodules; no carotid bruit or JVD  Back:   Symmetric, no curvature, ROM normal,  no CVA tenderness  Lungs:   Clear to auscultation bilaterally, respirations unlabored  Chest wall:  No tenderness or deformity  Heart:  Regular rate and rhythm, S1 and S2 normal, no murmur, rub or gallop  Abdomen:   Soft, non-tender, bowel sounds active all four quadrants, no masses, no organomegaly  Extremities: Extremities normal, atraumatic, no cyanosis or edema  Prostate : Not done   Skin: Skin color, texture, turgor normal, no rashes or lesions  Lymph nodes: Cervical, supraclavicular, and axillary nodes normal  Neurologic: CNII-XII grossly intact. Normal strength, sensation and reflexes throughout   AssessmentPlan:   Depression, major, single episode, complete remission ([place]) Well controlled, continue Cymbalta 30 mg daily, Wellbutrin extended release 300 mg daily, Abilify 2.5  mg daily Follow-up in 1 year, sooner if concerns I discussed with patient that if they develop any SI, to tell someone immediately and seek medical attention.  Nocturia; Prostate cancer screening Patient declines referral for urology at this time Will update PSA per patient request Follow-up based on symptoms  Anemia, unspecified type Update blood work today and provide recommendations accordingly to current supplement of 325 mg ferrous sulfate daily  High risk medication use Will update lipid panel due to abilify use  Hair loss We will update iron panel His thyroid was recently checked over the summer and was essentially normal He denies having had COVID Consider topical Rogaine    Patient Counseling: [x]   Nutrition: Stressed importance of moderation in sodium/caffeine intake, saturated fat and cholesterol, caloric balance, sufficient intake of fresh fruits, vegetables, and fiber  [x]   Stressed the importance of regular exercise.   []   Substance Abuse: Discussed cessation/primary prevention of tobacco, alcohol, or other drug use; driving or other dangerous activities under the influence; availability  of treatment for abuse.   [x]   Injury prevention: Discussed safety belts, safety helmets, smoke detector, smoking near bedding or upholstery.   []   Sexuality: Discussed sexually transmitted diseases, partner selection, use of condoms, avoidance of unintended pregnancy  and contraceptive alternatives.   [x]   Dental health: Discussed importance of regular tooth brushing, flossing, and dental visits.  [x]   Health maintenance and immunizations reviewed. Please refer to Health maintenance section.   I,Havlyn C Ratchford,acting as a Education administrator for [employer], PA.,have documented all relevant documentation on the behalf of Andrew Coke, PA,as directed by  Andrew Coke, PA while in the presence of Andrew Benitez, [place].  I, Andrew Benitez, [place], have reviewed all documentation for this visit. The documentation on 07/17/21 for the exam, diagnosis, procedures, and orders are all accurate and complete.   Andrew Coke, PA-C [place]

## 2021-07-17 ENCOUNTER — Ambulatory Visit (INDEPENDENT_AMBULATORY_CARE_PROVIDER_SITE_OTHER): Payer: Medicare Other | Admitting: Physician Assistant

## 2021-07-17 ENCOUNTER — Encounter: Payer: Self-pay | Admitting: Physician Assistant

## 2021-07-17 ENCOUNTER — Other Ambulatory Visit: Payer: Self-pay

## 2021-07-17 VITALS — BP 100/60 | HR 60 | Temp 97.9°F | Ht 71.5 in | Wt 157.4 lb

## 2021-07-17 DIAGNOSIS — Z125 Encounter for screening for malignant neoplasm of prostate: Secondary | ICD-10-CM

## 2021-07-17 DIAGNOSIS — L659 Nonscarring hair loss, unspecified: Secondary | ICD-10-CM

## 2021-07-17 DIAGNOSIS — D649 Anemia, unspecified: Secondary | ICD-10-CM

## 2021-07-17 DIAGNOSIS — R351 Nocturia: Secondary | ICD-10-CM | POA: Diagnosis not present

## 2021-07-17 DIAGNOSIS — Z79899 Other long term (current) drug therapy: Secondary | ICD-10-CM

## 2021-07-17 DIAGNOSIS — F325 Major depressive disorder, single episode, in full remission: Secondary | ICD-10-CM | POA: Diagnosis not present

## 2021-07-17 LAB — CBC WITH DIFFERENTIAL/PLATELET
Basophils Absolute: 0.1 10*3/uL (ref 0.0–0.1)
Basophils Relative: 1.3 % (ref 0.0–3.0)
Eosinophils Absolute: 0.1 10*3/uL (ref 0.0–0.7)
Eosinophils Relative: 1.3 % (ref 0.0–5.0)
HCT: 40.9 % (ref 39.0–52.0)
Hemoglobin: 13.3 g/dL (ref 13.0–17.0)
Lymphocytes Relative: 34.3 % (ref 12.0–46.0)
Lymphs Abs: 1.4 10*3/uL (ref 0.7–4.0)
MCHC: 32.6 g/dL (ref 30.0–36.0)
MCV: 87.7 fl (ref 78.0–100.0)
Monocytes Absolute: 0.4 10*3/uL (ref 0.1–1.0)
Monocytes Relative: 11 % (ref 3.0–12.0)
Neutro Abs: 2.1 10*3/uL (ref 1.4–7.7)
Neutrophils Relative %: 52.1 % (ref 43.0–77.0)
Platelets: 207 10*3/uL (ref 150.0–400.0)
RBC: 4.66 Mil/uL (ref 4.22–5.81)
RDW: 13.8 % (ref 11.5–15.5)
WBC: 4 10*3/uL (ref 4.0–10.5)

## 2021-07-17 LAB — COMPREHENSIVE METABOLIC PANEL
ALT: 18 U/L (ref 0–53)
AST: 21 U/L (ref 0–37)
Albumin: 4.4 g/dL (ref 3.5–5.2)
Alkaline Phosphatase: 59 U/L (ref 39–117)
BUN: 17 mg/dL (ref 6–23)
CO2: 30 mEq/L (ref 19–32)
Calcium: 9.2 mg/dL (ref 8.4–10.5)
Chloride: 105 mEq/L (ref 96–112)
Creatinine, Ser: 0.93 mg/dL (ref 0.40–1.50)
GFR: 83.25 mL/min (ref >60.00)
Glucose, Bld: 87 mg/dL (ref 70–99)
Potassium: 5.3 mEq/L — ABNORMAL HIGH (ref 3.5–5.1)
Sodium: 140 mEq/L (ref 135–145)
Total Bilirubin: 0.4 mg/dL (ref 0.2–1.2)
Total Protein: 6.5 g/dL (ref 6.0–8.3)

## 2021-07-17 LAB — LIPID PANEL
Cholesterol: 160 mg/dL (ref 0–200)
HDL: 57.7 mg/dL (ref >39.00)
LDL Cholesterol: 92 mg/dL (ref 0–99)
NonHDL: 102.28
Total CHOL/HDL Ratio: 3
Triglycerides: 49 mg/dL (ref 0.0–149.0)
VLDL: 9.8 mg/dL (ref 0.0–40.0)

## 2021-07-17 LAB — PSA: PSA: 1.37 ng/mL (ref 0.10–4.00)

## 2021-07-17 NOTE — Patient Instructions (Addendum)
It was great to see you!  Consider rogaine for your hair   Marijuana and lung cancer -- this is from my reputable source The risk of lung cancer due to smoking marijuana or cocaine is less clear than with tobacco. An association between lung cancer and smoking these agents has been difficult to prove because studies were limited by selection bias, small sample size, and failure to adjust for tobacco smoking. In addition, the duration from the onset of drug use to outcome (ie, lung cancer) may have been too short for lung cancer to develop because young participants were enrolled in most studies.   Several reports have documented histologic and molecular changes in the bronchial epithelium of marijuana smokers that are similar to the metaplastic premalignant alterations that are seen among tobacco smokers.Users of these drugs are probably at increased risk for lung cancer, although the magnitude of risk has not been well quantified.    We will be in touch with blood work results  Please go to the lab for blood work.   Our office will call you with your results unless you have chosen to receive results via MyChart.  If your blood work is normal we will follow-up each year for physicals and as scheduled for chronic medical problems.  If anything is abnormal we will treat accordingly and get you in for a follow-up.  Take care,  [facility] Readings from Last 4 Encounters:  07/17/21 157 lb 6.1 oz (71.4 kg)  03/05/21 152 lb 9.6 oz (69.2 kg)  12/18/20 150 lb (68 kg)  07/06/20 155 lb 6.1 oz (70.5 kg)

## 2021-07-18 ENCOUNTER — Other Ambulatory Visit: Payer: Self-pay | Admitting: Physician Assistant

## 2021-07-18 DIAGNOSIS — E875 Hyperkalemia: Secondary | ICD-10-CM

## 2021-07-18 LAB — IRON,TIBC AND FERRITIN PANEL
%SAT: 18 % (calc) — ABNORMAL LOW (ref 20–48)
Ferritin: 13 ng/mL — ABNORMAL LOW (ref 24–380)
Iron: 73 ug/dL (ref 50–180)
TIBC: 404 mcg/dL (calc) (ref 250–425)

## 2021-07-31 ENCOUNTER — Encounter: Payer: Self-pay | Admitting: Physician Assistant

## 2021-08-14 ENCOUNTER — Other Ambulatory Visit: Payer: Self-pay | Admitting: Physician Assistant

## 2021-08-14 DIAGNOSIS — M5430 Sciatica, unspecified side: Secondary | ICD-10-CM

## 2021-08-21 ENCOUNTER — Encounter: Payer: Self-pay | Admitting: Physical Therapy

## 2021-08-21 ENCOUNTER — Ambulatory Visit (INDEPENDENT_AMBULATORY_CARE_PROVIDER_SITE_OTHER): Payer: Medicare Other | Admitting: Physical Therapy

## 2021-08-21 ENCOUNTER — Other Ambulatory Visit: Payer: Self-pay

## 2021-08-21 ENCOUNTER — Encounter: Payer: Self-pay | Admitting: Physician Assistant

## 2021-08-21 DIAGNOSIS — M5442 Lumbago with sciatica, left side: Secondary | ICD-10-CM

## 2021-08-21 DIAGNOSIS — M5441 Lumbago with sciatica, right side: Secondary | ICD-10-CM

## 2021-08-21 NOTE — Therapy (Signed)
OUTPATIENT PHYSICAL THERAPY THORACOLUMBAR EVALUATION   Patient Name: Andrew Benitez  MRN: [id number] DOB:[birth year], 70 y.o., male Today's Date: 08/21/2021   PT End of Session - 08/21/21 1247     Visit Number 1    Number of Visits 12    Date for PT Re-Evaluation 10/02/21    Authorization Type Medicare    PT Start Time 0806    PT Stop Time 0844    PT Time Calculation (min) 38 min    Activity Tolerance Patient tolerated treatment well    Behavior During Therapy [facility] for tasks assessed/performed             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)   Past Surgical History:  Procedure Laterality Date   BREAST SURGERY  02/24/1981   Gynecomastia; date is an estimate   CYSTOSCOPY WITH INSERTION OF UROLIFT  2018   MASTECTOMY FOR GYNECOMASTIA Left 1983   SKIN CANCER EXCISION  2018   right side of neck and left forehead   TONSILLECTOMY     Patient Active Problem List   Diagnosis Date Noted   Depression     PCP: Inda Coke, PA  REFERRING PROVIDER: Inda Coke, PA  REFERRING DIAG: M54.30 (ICD-10-CM) - Sciatica, unspecified laterality  THERAPY DIAG:  Acute bilateral low back pain with bilateral sciatica - Plan: PT plan of care cert/re-cert   SUBJECTIVE:                                                                                                                                                                                           SUBJECTIVE STATEMENT:   Pt with increased pain for about 3-4 months in bil LE s. No pain in back. He notes tightness in hamstrings and soreness behind both knees. He has most pain first thing in AM, and with increased bending, walking. 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   PERTINENT HISTORY: benign enlarged prostate with baseline urgency, no other changes in bowel/bladder.   PAIN:  Are  you having pain? Yes VAS scale: 4/10 Pain location: Bil LEs Pain orientation: Bilateral  PAIN TYPE: aching Pain description: intermittent  Aggravating factors: Bending, squatting, forward flexion Relieving factors: none stated   PRECAUTIONS: None  WEIGHT BEARING RESTRICTIONS No  FALLS:  Has patient fallen in last 6 months? No,   LIVING ENVIRONMENT:  Stairs: Yes; 3 to enter  Has following equipment at home: None   PATIENT GOALS : decreased pain  in bil LEs.     OBJECTIVE:     COGNITION:  Overall cognitive status: Within functional limits for tasks assessed     MUSCLE LENGTH: Significant HS tightness bilaterally    Palpation: minimal tenderness in bil HS and posterior knee, Mild tenderness in low lumbar spine, hypomobile lumbar spine with PA s. Significant tightness in bil HS.    LUMBARAROM/PROM  A/PROM AROM 08/21/2021   Flexion Mod limitation    Extension Mod limitation   Right lateral flexion Mild limitation/pain   Left lateral flexion Mild limitation   Right rotation    Left rotation     (Blank rows = not tested)  LE AROM/PROM: Hip: mild limitation for ER bil;    LE MMT:  Hips: 4/5,    LUMBAR SPECIAL TESTS:  SLR reproduces tightness and soreness in posterior knee and HS,   SPINAL SEGMENTAL MOBILITY ASSESSMENT:  Hypomobile thoracic and lumbar spine    GAIT: Unremarkable   TODAY'S TREATMENT   EVAL:  See HEP below      PATIENT EDUCATION:  Education details: PT POC, exam findings, HEP  Person educated: Patient Education method: Explanation, Demonstration, Tactile cues, Verbal cues, and Handouts Education comprehension: verbalized understanding, returned demonstration, verbal cues required, tactile cues required, and needs further education   HOME EXERCISE PROGRAM: Access Code: [email or web address] URL: [email or web address] Date: 08/21/2021 Prepared by: Lyndee Hensen  Exercises Single Knee to Chest Stretch - 2 x daily - 3 reps  - 30 hold Supine Lower Trunk Rotation - 2 x daily - 10 reps - 5 hold Supine Figure 4 Piriformis Stretch - 2 x daily - 3 reps - 30 hold Seated Hamstring Stretch - 2-3 x daily - 3 reps - 30 hold   ASSESSMENT:  CLINICAL IMPRESSION: Objective impairments include decreased activity tolerance, decreased mobility, decreased ROM, decreased strength, hypomobility, increased muscle spasms, impaired flexibility, and pain. These impairments are limiting patient from community activity, meal prep, yard work, shopping, and yard work. Personal factors including Time since onset of injury/illness/exacerbation are also affecting patient's functional outcome. Patient will benefit from skilled PT to address above impairments and improve overall function.  Pt presents with primary complaint of increased pain in bil LE s. He has significant tightness in bil HS with mild soreness in posterior knees. He has stiffness and ROM limitation for lumbar spine, with difficulty with forward flexion. He has lack of effective HEP for his diagnosis. Pt with decreased ability for full functional activities due to pain. He has symptoms of bil lumbar radicular pain, vs hamstring and posterior chain limitation. Pt given flexion bias exercises to start with HEP today.If radicular pain does not respond well to PT, pt will be referred to ortho or spine specialist due to bil radicular pain.    REHAB POTENTIAL: Good  CLINICAL DECISION MAKING: Stable/uncomplicated  EVALUATION COMPLEXITY: Low   GOALS: Goals reviewed with patient? Yes  SHORT TERM GOALS:  STG Name Target Date Goal status  1 Pt to be independent with initial HEP 09/04/21 INITIAL  2     3     4     5     6     7       LONG TERM GOALS:   LTG Name Target Date Goal status  1 Pt to be independent with final HEP  Baseline: 10/02/21 INITIAL  2 Pt to demo improved lumbar flexion ROM, to be [facility] for pt age, and non painful, to improve ability for ADLS.  Baseline:  10/02/21  INITIAL  3 Pt to report decreased pain in back and bil LE s  to be 0-2/10 with standing and bending activity, to improve ability for IADLs and volunteer work.  Baseline: 10/02/21 INITIAL  4 Pt to report ability for standing/walking activity for at  least 1 hr, with pain no greater than 2/10 In LE s . Baseline: 10/02/21 INITIAL                   PLAN: PT FREQUENCY: 2x/week  PT DURATION:  6 weeks  PLANNED INTERVENTIONS: Therapeutic exercises, Therapeutic activity, Neuro Muscular re-education, Balance training, Gait training, Patient/Family education, Joint mobilization, Dry Needling, Electrical stimulation, Spinal mobilization, Cryotherapy, Moist heat, Taping, Traction, Ultrasound, Ionotophoresis 4mg /ml Dexamethasone, and Manual therapy     Lyndee Hensen, PT, DPT 1:26 PM  08/21/21

## 2021-08-26 ENCOUNTER — Encounter: Payer: Self-pay | Admitting: *Deleted

## 2021-08-30 ENCOUNTER — Encounter: Payer: Self-pay | Admitting: Physical Therapy

## 2021-08-30 ENCOUNTER — Other Ambulatory Visit: Payer: Self-pay

## 2021-08-30 ENCOUNTER — Ambulatory Visit (INDEPENDENT_AMBULATORY_CARE_PROVIDER_SITE_OTHER): Payer: Medicare Other | Admitting: Physical Therapy

## 2021-08-30 DIAGNOSIS — M5442 Lumbago with sciatica, left side: Secondary | ICD-10-CM | POA: Diagnosis not present

## 2021-08-30 DIAGNOSIS — E875 Hyperkalemia: Secondary | ICD-10-CM | POA: Diagnosis not present

## 2021-08-30 DIAGNOSIS — M5441 Lumbago with sciatica, right side: Secondary | ICD-10-CM

## 2021-08-30 LAB — BASIC METABOLIC PANEL
BUN: 15 mg/dL (ref 6–23)
CO2: 30 mEq/L (ref 19–32)
Calcium: 9.4 mg/dL (ref 8.4–10.5)
Chloride: 104 mEq/L (ref 96–112)
Creatinine, Ser: 0.97 mg/dL (ref 0.40–1.50)
GFR: 79.08 mL/min (ref >60.00)
Glucose, Bld: 64 mg/dL — ABNORMAL LOW (ref 70–99)
Potassium: 5 mEq/L (ref 3.5–5.1)
Sodium: 138 mEq/L (ref 135–145)

## 2021-08-30 NOTE — Therapy (Signed)
OUTPATIENT PHYSICAL THERAPY TREATMENT NOTE   Patient Name: Andrew Benitez MRN: [id number] DOB:Nov 22, 1950, 70 y.o., male Today's Date: 08/30/2021  PCP: Inda Coke, PA REFERRING PROVIDER: Inda Coke, PA   PT End of Session - 08/30/21 1032     Visit Number 2    Number of Visits 12    Date for PT Re-Evaluation 10/02/21    Authorization Type Medicare    PT Start Time 1020    PT Stop Time 1105    PT Time Calculation (min) 45 min    Activity Tolerance Patient tolerated treatment well    Behavior During Therapy WFL for tasks assessed/performed             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 Still having increased pain in bil LE s. Has trouble describing when pain is worst.                                                                                            PERTINENT HISTORY: benign enlarged prostate with baseline urgency, no other changes in bowel/bladder.    PAIN:  Are you having pain? Yes VAS scale: 5/10 Pain location: Bil LEs Pain orientation: Bilateral  PAIN TYPE: aching Pain description: intermittent  Aggravating factors: Bending, squatting, forward flexion Relieving factors: none stated       PATIENT GOALS :  decreased pain in bil LEs.        OBJECTIVE:          LUMBARAROM/PROM   A/PROM AROM 08/21/2021    Flexion Mod limitation      Extension Mod limitation    Right lateral flexion Mild limitation/pain    Left lateral flexion Mild limitation    Right rotation      Left rotation       (Blank rows = not tested)       TODAY'S TREATMENT   08/30/21:  Stretches:  seated HSS 30 sec x 3;  Supine SKTC 30 sec x 3 bil,    Priformis fig 4 30 sec x 3 bil seated and supine;   LTR, 5 sec x 10 Aerobic: recumbent bike L1 x 7 min;  Supine:  Supine march with TA x 20;  Manual:  long leg distraction x 2  min bil for lumbar pump;  Lumbar PA mobs;  Self Care: answered several questions on activities to do and to avoid, dancing, work, walking, travel, lifting.         PATIENT EDUCATION:  Education details: reviewed and updated HEP, discussed POC  Person educated: Patient Education method: Explanation, Demonstration, Tactile cues, Verbal cues, and Handouts Education comprehension: verbalized understanding, returned demonstration, verbal cues required, tactile cues required, and needs further education     HOME EXERCISE PROGRAM: Access Code: [email or web address]       ASSESSMENT:   CLINICAL IMPRESSION:  08/30/21:  Pt with good ability for there ex today. Reviewed HEP and optimal mechanics. Added light core strength, pt with good ability for TA. Plan to progress mobility and strength as tolerated. Discussed other activities to avoid, for decreased pain, with exercise, work, and lifting.    Objective impairments include decreased activity tolerance, decreased mobility, decreased ROM, decreased strength, hypomobility, increased muscle spasms, impaired flexibility, and pain. These impairments are limiting patient from community activity, meal prep, yard work, shopping, and yard work. Personal factors including Time since onset of injury/illness/exacerbation are also affecting patient's functional  outcome. Patient will benefit from skilled PT to address above impairments and improve overall function.       REHAB POTENTIAL: Good   CLINICAL DECISION MAKING: Stable/uncomplicated   EVALUATION COMPLEXITY: Low     GOALS: Goals reviewed with patient? Yes   SHORT TERM GOALS:   STG Name Target Date Goal status  1 Pt to be independent with initial HEP 09/04/21 INITIAL  2        3        4        5        6        7           LONG TERM GOALS:    LTG Name Target Date Goal status  1 Pt to be independent with final HEP  Baseline: 10/02/21 INITIAL  2 Pt to demo improved lumbar flexion ROM, to be [facility] for pt age, and non painful, to improve ability for ADLS.  Baseline: 10/02/21 INITIAL  3 Pt to report decreased pain in back and bil LE s  to be 0-2/10 with standing and bending activity, to improve ability for IADLs and volunteer work.  Baseline: 10/02/21 INITIAL  4 Pt to report ability for standing/walking activity for at  least 1 hr, with pain no greater than 2/10 In LE s . Baseline: 10/02/21 INITIAL                                 PLAN: PT FREQUENCY: 2x/week   PT DURATION:  6 weeks   PLANNED INTERVENTIONS: Therapeutic exercises, Therapeutic activity, Neuro Muscular re-education, Balance training, Gait training, Patient/Family education, Joint mobilization, Dry Needling, Electrical stimulation, Spinal mobilization, Cryotherapy, Moist heat, Taping, Traction, Ultrasound, Ionotophoresis 4mg /ml Dexamethasone, and Manual therapy   Lyndee Hensen, PT, DPT 3:34 PM  08/30/21

## 2021-09-02 ENCOUNTER — Other Ambulatory Visit: Payer: Self-pay

## 2021-09-02 ENCOUNTER — Encounter: Payer: Self-pay | Admitting: Physical Therapy

## 2021-09-02 ENCOUNTER — Ambulatory Visit (INDEPENDENT_AMBULATORY_CARE_PROVIDER_SITE_OTHER): Payer: Medicare Other | Admitting: Physical Therapy

## 2021-09-02 DIAGNOSIS — M5442 Lumbago with sciatica, left side: Secondary | ICD-10-CM

## 2021-09-02 DIAGNOSIS — M5441 Lumbago with sciatica, right side: Secondary | ICD-10-CM | POA: Diagnosis not present

## 2021-09-02 NOTE — Therapy (Signed)
OUTPATIENT PHYSICAL THERAPY TREATMENT NOTE   Patient Name: Andrew Benitez MRN: [id number] DOB:[birth year], 70 y.o., male Today's Date: 09/02/2021  PCP: Inda Coke, PA REFERRING PROVIDER: Inda Coke, PA   PT End of Session - 09/02/21 1332     Visit Number 3    Number of Visits 12    Date for PT Re-Evaluation 10/02/21    Authorization Type Medicare    PT Start Time 1330    PT Stop Time 1409    PT Time Calculation (min) 39 min    Activity Tolerance Patient tolerated treatment well    Behavior During Therapy WFL for tasks assessed/performed             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)   Past Surgical History:  Procedure Laterality Date   BREAST SURGERY  02/24/1981   Gynecomastia; date is an estimate   CYSTOSCOPY WITH INSERTION OF UROLIFT  2018   MASTECTOMY FOR GYNECOMASTIA Left 1983   SKIN CANCER EXCISION  2018   right side of neck and left forehead   TONSILLECTOMY     Patient Active Problem List   Diagnosis Date Noted   Depression     PCP: Inda Coke, PA   REFERRING PROVIDER: Inda Coke, PA   REFERRING DIAG: M54.30 (ICD-10-CM) - Sciatica, unspecified laterality   THERAPY DIAG:  Acute bilateral low back pain with bilateral sciatica      SUBJECTIVE:         Pt states that yesterday morning he had increased pain and soreness and difficulty with flexion and pain after performing his exercises. Reports slight improvement in symptoms today. Morning time is typically the worse and states he did not try the stretches prior to getting out of bed.                                                                                           PERTINENT HISTORY: benign enlarged prostate with baseline urgency, no other changes in bowel/bladder.    PAIN:  Are you having pain? Yes VAS scale: 5/10 Pain location: Bil LEs Pain orientation: Bilateral  PAIN TYPE:  aching Pain description: intermittent  Aggravating factors: Bending, squatting, forward flexion Relieving factors: none stated       PATIENT GOALS : decreased pain in bil LEs.        OBJECTIVE:          LUMBARAROM/PROM   A/PROM AROM 08/21/2021    Flexion Mod limitation      Extension Mod limitation    Right lateral flexion Mild limitation/pain    Left lateral flexion Mild limitation    Right rotation      Left rotation       (Blank rows = not tested)        TODAY'S TREATMENT  09/02/21: Therapeutic Exercise:   Aerobic: Recumbent bike L1 x 8 min Supine: LTR, 5 sec x 10; march with TA x 20; SLR with TA x10 Bil; SKTC 30 sec x 2 bil; clams with blue TB  x15 Bil;   Seated: HSS 30 sec x 3; Piriformis fig 4 30 sec x 3 bil   Standing: STS x10 - cues for form/posture; squats x10  Neuromuscular Re-education: Manual Therapy: long leg distraction x 2 min bil for lumbar pump; Lumbar and Thoracic PA mobs;  Therapeutic Activity:  Self Care:  Trigger Point Dry Needling:  Modalities:     08/30/21:  Stretches:  seated HSS 30 sec x 3;  Supine SKTC 30 sec x 3 bil,    Priformis fig 4 30 sec x 3 bil seated and supine;   LTR, 5 sec x 10 Aerobic: recumbent bike L1 x 7 min;  Supine:  Supine march with TA x 20;  Manual:  long leg distraction x 2 min bil for lumbar pump;  Lumbar PA mobs;  Self Care: answered several questions on activities to do and to avoid, dancing, work, walking, travel, lifting.         PATIENT EDUCATION:  Education details: reviewed and updated HEP, form and rationale for exercises, on difference between heat/ice and when to use which one for pain; On washing hair facing; Educated in Midwife - prior demonstration with lifting from legs vs back,forward vs backwards Person educated: Patient Education method: Explanation, Demonstration, Tactile cues, Verbal cues, and Handouts Education comprehension: verbalized understanding, returned demonstration,  verbal cues required, tactile cues required, and needs further education     HOME EXERCISE PROGRAM: Access Code: [email or web address]       ASSESSMENT:   CLINICAL IMPRESSION:  09/02/21:  Pt with good tolerance to there-ex today. Reviewed HEP and optimal mechanics and educated patient on rationale for exercises and difference between ice/heat.No tenderness with palpation noted along lumbar spine on this date. Loaded lumbar extension reproduces symptoms, verbal and tactile cues to keep more neutral spine with standing exercises to reduce pressure on lumbar spine. Plan to progress mobility and strength as tolerated. Discussed other activities to avoid, for decreased pain, with exercise, work, and lifting.    Objective impairments include decreased activity tolerance, decreased mobility, decreased ROM, decreased strength, hypomobility, increased muscle spasms, impaired flexibility, and pain. These impairments are limiting patient from community activity, meal prep, yard work, shopping, and yard work. Personal factors including Time since onset of injury/illness/exacerbation are also affecting patient's functional outcome. Patient will benefit from skilled PT to address above impairments and improve overall function.       REHAB POTENTIAL: Good   CLINICAL DECISION MAKING: Stable/uncomplicated   EVALUATION COMPLEXITY: Low     GOALS: Goals reviewed with patient? Yes   SHORT TERM GOALS:   STG Name Target Date Goal status  1 Pt to be independent with initial HEP 09/04/21 INITIAL  2        3        4        5        6        7           LONG TERM GOALS:    LTG Name Target Date Goal status  1 Pt to be independent with final HEP  Baseline: 10/02/21 INITIAL  2 Pt to demo improved lumbar flexion ROM, to be [facility] for pt age, and non painful, to improve ability for ADLS.  Baseline: 10/02/21 INITIAL  3 Pt to report decreased pain in back and bil LE s  to be 0-2/10 with standing and bending activity,  to improve ability for IADLs and volunteer work.  Baseline: 10/02/21 INITIAL  4 Pt to report ability for standing/walking activity for at  least 1 hr, with pain no greater than 2/10 In LE s . Baseline: 10/02/21 INITIAL                                 PLAN: PT FREQUENCY: 2x/week   PT DURATION:  6 weeks   PLANNED INTERVENTIONS: Therapeutic exercises, Therapeutic activity, Neuro Muscular re-education, Balance training, Gait training, Patient/Family education, Joint mobilization, Dry Needling, Electrical stimulation, Spinal mobilization, Cryotherapy, Moist heat, Taping, Traction, Ultrasound, Ionotophoresis 4mg /ml Dexamethasone, and Manual therapy   Lyndee Hensen, PT, DPT 2:18 PM  09/02/21

## 2021-09-04 ENCOUNTER — Ambulatory Visit (INDEPENDENT_AMBULATORY_CARE_PROVIDER_SITE_OTHER): Payer: Medicare Other | Admitting: Physical Therapy

## 2021-09-04 ENCOUNTER — Encounter: Payer: Self-pay | Admitting: Physician Assistant

## 2021-09-04 ENCOUNTER — Encounter: Payer: Self-pay | Admitting: Physical Therapy

## 2021-09-04 ENCOUNTER — Other Ambulatory Visit: Payer: Self-pay | Admitting: Physician Assistant

## 2021-09-04 DIAGNOSIS — R262 Difficulty in walking, not elsewhere classified: Secondary | ICD-10-CM

## 2021-09-04 DIAGNOSIS — M5442 Lumbago with sciatica, left side: Secondary | ICD-10-CM | POA: Diagnosis not present

## 2021-09-04 DIAGNOSIS — M6281 Muscle weakness (generalized): Secondary | ICD-10-CM | POA: Diagnosis not present

## 2021-09-04 DIAGNOSIS — M5441 Lumbago with sciatica, right side: Secondary | ICD-10-CM

## 2021-09-04 DIAGNOSIS — M5430 Sciatica, unspecified side: Secondary | ICD-10-CM

## 2021-09-04 NOTE — Therapy (Deleted)
.  h 

## 2021-09-04 NOTE — Therapy (Signed)
OUTPATIENT PHYSICAL THERAPY TREATMENT NOTE   Patient Name: Andrew Benitez MRN: [id number] DOB:1951/02/25, 70 y.o., male Today's Date: 09/04/2021  PCP: Inda Coke, PA REFERRING PROVIDER: Inda Coke, PA   PT End of Session - 09/04/21 1305     Visit Number 4    Number of Visits 12    Date for PT Re-Evaluation 10/02/21    Authorization Type Medicare    PT Start Time 1306    PT Stop Time 1353    PT Time Calculation (min) 47 min    Activity Tolerance Patient tolerated treatment well    Behavior During Therapy [facility] for tasks assessed/performed             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)   Past Surgical History:  Procedure Laterality Date   BREAST SURGERY  02/24/1981   Gynecomastia; date is an estimate   CYSTOSCOPY WITH INSERTION OF UROLIFT  2018   MASTECTOMY FOR GYNECOMASTIA Left 1983   SKIN CANCER EXCISION  2018   right side of neck and left forehead   TONSILLECTOMY     Patient Active Problem List   Diagnosis Date Noted   Depression     PCP: Inda Coke, PA   REFERRING PROVIDER: Inda Coke, PA   REFERRING DIAG: M54.30 (ICD-10-CM) - Sciatica, unspecified laterality   THERAPY DIAG:  Acute bilateral low back pain with bilateral sciatica      SUBJECTIVE:          States that he woke up this morning and his legs felt like he ran a race yesterday  reporting soreness and tightness. States that washing his hair leaning forward helps but that he could barely walk this morning.    PERTINENT HISTORY: benign enlarged prostate with baseline urgency, no other changes in bowel/bladder.    PAIN:  Are you having pain? Yes VAS scale: 4/10 Pain location: Bil LEs Pain orientation: Bilateral  PAIN TYPE: aching Pain description: intermittent  Aggravating factors: Bending, squatting, forward flexion Relieving factors: none stated       PATIENT GOALS :  decreased pain in bil LEs.        OBJECTIVE:          LUMBARAROM/PROM   A/PROM AROM 08/21/2021    Flexion Mod limitation      Extension Mod limitation    Right lateral flexion Mild limitation/pain    Left lateral flexion Mild limitation    Right rotation      Left rotation       (Blank rows = not tested)        TODAY'S TREATMENT  09/04/21 Therapeutic Exercise:   Supine: bridges 2x12 5" holds, thomas stretch x3 30" hlds B; bent knee fall outs x10 10" holds, bent knee fall ins x10 10" holds Bilateral, LTR on ball x15 B   Seated: hip flexion stretch x5 B- symptoms reduced   Standing: hip hinge reaching over table with weight on balls of feet -5 minutes total Neuromuscular Re-education: Manual Therapy: Lumbar traction on ball 8 minutes  Therapeutic Activity:  Self Care:  Trigger Point Dry Needling:  Modalities:   09/02/21: Therapeutic Exercise:   Aerobic: Recumbent bike L1 x 8 min Supine: LTR, 5 sec x 10; march with TA x 20; SLR with TA x10 Bil; SKTC 30 sec x 2 bil; clams with blue TB x15 Bil;   Seated: HSS 30  sec x 3; Piriformis fig 4 30 sec x 3 bil   Standing: STS x10 - cues for form/posture; squats x10  Neuromuscular Re-education: Manual Therapy: long leg distraction x 2 min bil for lumbar pump; Lumbar and Thoracic PA mobs;  Therapeutic Activity:  Self Care:  Trigger Point Dry Needling:  Modalities:     08/30/21:  Stretches:  seated HSS 30 sec x 3;  Supine SKTC 30 sec x 3 bil,    Priformis fig 4 30 sec x 3 bil seated and supine;   LTR, 5 sec x 10 Aerobic: recumbent bike L1 x 7 min;  Supine: Lx ROT on ball 2 minutes, thomas stretch x3 30" holds B Manual:  lumbar traction on ball 6 minutes Self Care: answered several questions on activities to do and to avoid, dancing, work, walking, travel, lifting.         PATIENT EDUCATION:  Education details: reviewed and updated HEP, form and rationale for exercises, on how and why to perform different  exercises Person educated: Patient Education method: Explanation, Demonstration, Tactile cues, Verbal cues, and Handouts Education comprehension: verbalized understanding, returned demonstration, verbal cues required, tactile cues required, and needs further education     HOME EXERCISE PROGRAM: Access Code: [email or web address]       ASSESSMENT:   CLINICAL IMPRESSION:  09/04/21:   Added supine exercises with legs and hips at 90/90 to reduce tension/pressure on lumbar spine. Patient with reduced left hip IR, Right ER and right hip extension. With right hip extension patient reported lumbar symptoms but this reduced with hip in neutral instead of with hip in extension during thomas stretch. Added hip hinge for hamstring activation and improved pelvic motion and with repetition patient reported symptoms in glutes most likely due to fatigue. Able to reduce symptoms with hip/lumbar flexion stretch. Symptoms consistent with stenosis, will continue with current POC.    Objective impairments include decreased activity tolerance, decreased mobility, decreased ROM, decreased strength, hypomobility, increased muscle spasms, impaired flexibility, and pain. These impairments are limiting patient from community activity, meal prep, yard work, shopping, and yard work. Personal factors including Time since onset of injury/illness/exacerbation are also affecting patient's functional outcome. Patient will benefit from skilled PT to address above impairments and improve overall function.       REHAB POTENTIAL: Good   CLINICAL DECISION MAKING: Stable/uncomplicated   EVALUATION COMPLEXITY: Low     GOALS: Goals reviewed with patient? Yes   SHORT TERM GOALS:   STG Name Target Date Goal status  1 Pt to be independent with initial HEP 09/04/21 INITIAL  2        3        4        5        6        7           LONG TERM GOALS:    LTG Name Target Date Goal status  1 Pt to be independent with final HEP   Baseline: 10/02/21 INITIAL  2 Pt to demo improved lumbar flexion ROM, to be [facility] for pt age, and non painful, to improve ability for ADLS.  Baseline: 10/02/21 INITIAL  3 Pt to report decreased pain in back and bil LE s  to be 0-2/10 with standing and bending activity, to improve ability for IADLs and volunteer work.  Baseline: 10/02/21 INITIAL  4 Pt to report ability for standing/walking activity for at  least 1 hr, with pain no greater than 2/10  In LE s . Baseline: 10/02/21 INITIAL                                 PLAN: PT FREQUENCY: 2x/week   PT DURATION:  6 weeks   PLANNED INTERVENTIONS: Therapeutic exercises, Therapeutic activity, Neuro Muscular re-education, Balance training, Gait training, Patient/Family education, Joint mobilization, Dry Needling, Electrical stimulation, Spinal mobilization, Cryotherapy, Moist heat, Taping, Traction, Ultrasound, Ionotophoresis 4mg /ml Dexamethasone, and Manual therapy   2:14 PM, 09/04/21 Jerene Pitch, DPT Physical Therapy with [facility]  [phone] office

## 2021-09-18 ENCOUNTER — Other Ambulatory Visit: Payer: Self-pay

## 2021-09-18 ENCOUNTER — Ambulatory Visit (INDEPENDENT_AMBULATORY_CARE_PROVIDER_SITE_OTHER): Payer: Medicare Other | Admitting: Physical Therapy

## 2021-09-18 ENCOUNTER — Encounter: Payer: Self-pay | Admitting: Physical Therapy

## 2021-09-18 DIAGNOSIS — M5430 Sciatica, unspecified side: Secondary | ICD-10-CM | POA: Diagnosis not present

## 2021-09-18 DIAGNOSIS — M5442 Lumbago with sciatica, left side: Secondary | ICD-10-CM | POA: Diagnosis not present

## 2021-09-18 DIAGNOSIS — M5441 Lumbago with sciatica, right side: Secondary | ICD-10-CM | POA: Diagnosis not present

## 2021-09-18 DIAGNOSIS — M6281 Muscle weakness (generalized): Secondary | ICD-10-CM | POA: Diagnosis not present

## 2021-09-18 NOTE — Therapy (Signed)
OUTPATIENT PHYSICAL THERAPY TREATMENT NOTE   Patient Name: Andrew Benitez MRN: [id number] DOB:1951/07/08, 71 y.o., male Today's Date: 09/18/2021  PCP: Inda Coke, PA REFERRING PROVIDER: Inda Coke, PA   PT End of Session - 09/18/21 1108     Visit Number 5    Number of Visits 12    Date for PT Re-Evaluation 10/02/21    Authorization Type Medicare    PT Start Time 1109   late to apt   PT Stop Time 1158    PT Time Calculation (min) 49 min    Activity Tolerance Patient tolerated treatment well    Behavior During Therapy [facility] for tasks assessed/performed              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)   Past Surgical History:  Procedure Laterality Date   BREAST SURGERY  02/24/1981   Gynecomastia; date is an estimate   CYSTOSCOPY WITH INSERTION OF UROLIFT  2018   MASTECTOMY FOR GYNECOMASTIA Left 1983   SKIN CANCER EXCISION  2018   right side of neck and left forehead   TONSILLECTOMY     Patient Active Problem List   Diagnosis Date Noted   Depression     PCP: Inda Coke, PA   REFERRING PROVIDER: Inda Coke, PA   REFERRING DIAG: M54.30 (ICD-10-CM) - Sciatica, unspecified laterality   THERAPY DIAG:  Acute bilateral low back pain with bilateral sciatica      SUBJECTIVE:          States that he continues to have pain in the morning every morning. States that as he walked and dance he felt better and was able to do about 20000 steps a day. States he has been doing exercises twice a day for every day. States that some days the hamstring stretch, bridges and thomas stretch seemed to make things worse.    PERTINENT HISTORY: benign enlarged prostate with baseline urgency, no other changes in bowel/bladder.    PAIN:  Are you having pain? Yes VAS scale: 1/10 Pain location: Bil LEs Pain orientation: Bilateral  PAIN TYPE: aching Pain description:  intermittent  Aggravating factors: Bending, squatting, forward flexion Relieving factors: none stated       PATIENT GOALS : decreased pain in bil LEs.        OBJECTIVE:      LUMBARAROM/PROM   A/PROM AROM 08/21/2021    Flexion Mod limitation      Extension Mod limitation    Right lateral flexion Mild limitation/pain    Left lateral flexion Mild limitation    Right rotation      Left rotation       (Blank rows = not tested)        TODAY'S TREATMENT  09/18/21 Therapeutic Exercise:   Supine: DKC on ball x2 1 minute, LTR 2 minutes on ball, hamstring isometrics 5" holds B 2 minutes bridges 2x5, 5" holds,   Neuromuscular Re-education: TRA activation - verbal and tactile cues - isometric then with SLR - 10 minutes practice Manual Therapy: Lumbar traction on ball 5 minutes  Therapeutic Activity: lifting mechanics in lunge form - 8 minutes of practice and cues   09/04/21 Therapeutic Exercise:   Supine: bridges 2x12 5" holds, thomas stretch x3 30" hlds B; bent knee fall outs x10 10" holds, bent knee fall ins x10 10" holds Bilateral, LTR on ball x15 B  Seated: hip flexion stretch x5 B- symptoms reduced   Standing: hip hinge reaching over table with weight on balls of feet -5 minutes total Neuromuscular Re-education: Manual Therapy: Lumbar traction on ball 8 minutes  Therapeutic Activity:  Self Care:  Trigger Point Dry Needling:  Modalities:   09/02/21: Therapeutic Exercise:   Aerobic: Recumbent bike L1 x 8 min Supine: LTR, 5 sec x 10; march with TA x 20; SLR with TA x10 Bil; SKTC 30 sec x 2 bil; clams with blue TB x15 Bil;   Seated: HSS 30 sec x 3; Piriformis fig 4 30 sec x 3 bil   Standing: STS x10 - cues for form/posture; squats x10  Neuromuscular Re-education: Manual Therapy: long leg distraction x 2 min bil for lumbar pump; Lumbar and Thoracic PA mobs;  Therapeutic Activity:  Self Care:  Trigger Point Dry Needling:  Modalities:         PATIENT EDUCATION:   Education details: reviewed and updated HEP, form and rationale for exercises, on how and why to perform different exercises Person educated: Patient Education method: Explanation, Demonstration, Tactile cues, Verbal cues, and Handouts Education comprehension: verbalized understanding, returned demonstration, verbal cues required, tactile cues required, and needs further education     HOME EXERCISE PROGRAM: Access Code: [email or web address]       ASSESSMENT:   CLINICAL IMPRESSION:  09/18/21:    Reviewed HEP exercises that were painful for patient and cued for proper form and proper rest breaks which allowed for reduced symptoms. Educated and practiced upper abdominal activation with lower rib depression and transitioned this to functional movements. Practiced lifting mechanics from floor in deep lunge and "pressing the floor away" to help reduce symptoms. This was tolerated well. Patient to see MD tomorrow, will follow up with patient post MD visit.   Objective impairments include decreased activity tolerance, decreased mobility, decreased ROM, decreased strength, hypomobility, increased muscle spasms, impaired flexibility, and pain. These impairments are limiting patient from community activity, meal prep, yard work, shopping, and yard work. Personal factors including Time since onset of injury/illness/exacerbation are also affecting patient's functional outcome. Patient will benefit from skilled PT to address above impairments and improve overall function.       REHAB POTENTIAL: Good   CLINICAL DECISION MAKING: Stable/uncomplicated   EVALUATION COMPLEXITY: Low     GOALS: Goals reviewed with patient? Yes   SHORT TERM GOALS:   STG Name Target Date Goal status  1 Pt to be independent with initial HEP 09/04/21 INITIAL  2        3        4        5        6        7           LONG TERM GOALS:    LTG Name Target Date Goal status  1 Pt to be independent with final HEP  Baseline:  10/02/21 INITIAL  2 Pt to demo improved lumbar flexion ROM, to be [facility] for pt age, and non painful, to improve ability for ADLS.  Baseline: 10/02/21 INITIAL  3 Pt to report decreased pain in back and bil LE s  to be 0-2/10 with standing and bending activity, to improve ability for IADLs and volunteer work.  Baseline: 10/02/21 INITIAL  4 Pt to report ability for standing/walking activity for at  least 1 hr, with pain no greater than 2/10 In LE s . Baseline: 10/02/21 INITIAL  PLAN: PT FREQUENCY: 2x/week   PT DURATION:  6 weeks   PLANNED INTERVENTIONS: Therapeutic exercises, Therapeutic activity, Neuro Muscular re-education, Balance training, Gait training, Patient/Family education, Joint mobilization, Dry Needling, Electrical stimulation, Spinal mobilization, Cryotherapy, Moist heat, Taping, Traction, Ultrasound, Ionotophoresis 4mg /ml Dexamethasone, and Manual therapy   12:46 PM, 09/18/21 Jerene Pitch, DPT Physical Therapy with [facility]  [phone] office

## 2021-09-18 NOTE — Progress Notes (Signed)
[place] Phone: [phone] Subjective:   Andrew Benitez, am serving as a scribe for Dr. Hulan Benitez.This visit occurred during the SARS-CoV-2 public health emergency.  Safety protocols were in place, including screening questions prior to the visit, additional usage of staff PPE, and extensive cleaning of exam room while observing appropriate contact time as indicated for disinfecting solutions.  I'm seeing this patient by the request  of:  Andrew Benitez, [place]  CC: Low back pain  PIR:[id number]  Andrew Benitez is a 71 y.o. male coming in with complaint of back and B leg pain. Patient has been doing PT at the [facility] office. Patient states that his pain was been going on past 6 months with worsening pain over past 2 months. Doing PT and stretches which are not helping. Pain is a strong ache in the legs. Pain worse in the mornings and standing in one spot. Carrying grandson also increases his pain. Denies any numbness or tingling. Taking Advil and Tylenol for pain.  Reviewed patient's physical therapy notes recently.  Patient did bring in exercises that have been given to him previously as well.  Laboratory work-up does show the patient does have iron deficiency with a ferritin of 13.    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)   Past Surgical History:  Procedure Laterality Date   BREAST SURGERY  02/24/1981   Gynecomastia; date is an estimate   CYSTOSCOPY WITH INSERTION OF UROLIFT  2018   MASTECTOMY FOR GYNECOMASTIA Left 1983   SKIN CANCER EXCISION  2018   right side of neck and left forehead   TONSILLECTOMY     Social History   Socioeconomic History   Marital status: Married    Spouse name: Not on file   Number of children: Not on file   Years of education: Not on file   Highest education level: Not on file  Occupational  History   Not on file  Tobacco Use   Smoking status: Never   Smokeless tobacco: Never  Vaping Use   Vaping Use: Never used  Substance and Sexual Activity   Alcohol use: Yes    Alcohol/week: 1.0 standard drink    Types: 1 Cans of beer per week    Comment: one beer every  week   Drug use: Yes    Frequency: 2.0 times per week    Types: Marijuana    Comment: once a week   Sexual activity: Not Currently    Comment: Vasectomy  Other Topics Concern   Not on file  Social History Narrative   Married   Two children, one in [place]   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   Social Determinants of Radio broadcast assistant Strain: Low Risk    Difficulty of Paying Living Expenses: Not hard at all  Food Insecurity: Benitez Food Insecurity   Worried About Charity fundraiser in the Last Year: Never true   Arboriculturist in the Last Year: Never true  Transportation Needs: Benitez Transportation Needs   Lack of Transportation (Medical): Benitez   Lack of Transportation (Non-Medical): Benitez  Physical Activity: Insufficiently Active   Days of Exercise per Week: 7 days   Minutes of Exercise per Session: 20 min  Stress: Benitez Stress Concern Present   Feeling of Stress : Not at  all  Social Connections: Moderately Integrated   Frequency of Communication with Friends and Family: More than three times a week   Frequency of Social Gatherings with Friends and Family: Once a week   Attends Religious Services: Never   Marine scientist or Organizations: Yes   Attends Music therapist: More than 4 times per year   Marital Status: Married   Benitez Known Allergies Family History  Problem Relation Age of Onset   Alcohol abuse Mother    Arthritis Mother    COPD Mother    Vision loss Mother    Alcohol abuse Father    Depression Father    Early death Father    Breast cancer Sister    Alcohol abuse Sister    Depression Sister    Asthma Sister    Hyperlipidemia Sister    Heart attack Paternal  Grandmother         Current Outpatient Medications (Hematological):    ferrous sulfate 325 (65 FE) MG tablet, Take 325 mg by mouth every other day.  Current Outpatient Medications (Other):    ARIPiprazole (ABILIFY) 5 MG tablet, Take 0.5 tablets (2.5 mg total) by mouth daily.   buPROPion (WELLBUTRIN XL) 300 MG 24 hr tablet, Take 1 tablet (300 mg total) by mouth daily.   DULoxetine (CYMBALTA) 30 MG capsule, Take 1 capsule (30 mg total) by mouth daily.   FIBER PO, Take 1 each by mouth in the morning and at bedtime. Tablespoon   gabapentin (NEURONTIN) 100 MG capsule, Take 2 capsules (200 mg total) by mouth at bedtime.   Omega-3 Fatty Acids (FISH OIL) 1000 MG CAPS, Take 1 capsule by mouth daily.   Reviewed prior external information including notes and imaging from  primary care provider As well as notes that were available from care everywhere and other healthcare systems.  Past medical history, social, surgical and family history all reviewed in electronic medical record.  Benitez pertanent information unless stated regarding to the chief complaint.   Review of Systems:  Benitez headache, visual changes, nausea, vomiting, diarrhea, constipation, dizziness, abdominal pain, skin rash, fevers, chills, night sweats, weight loss, swollen lymph nodes, body aches, joint swelling, chest pain, shortness of breath, mood changes. POSITIVE muscle aches  Objective  Blood pressure 116/66, pulse 62, height 5' 11.5" (1.816 m), weight 159 lb (72.1 kg), SpO2 99 %.   General: Benitez apparent distress alert and oriented x3 mood and affect normal, dressed appropriately.  HEENT: Pupils equal, extraocular movements intact  Respiratory: Patient's speak in full sentences and does not appear short of breath  Cardiovascular: Benitez lower extremity edema, non tender, Benitez erythema  Gait normal with good balance and coordination.  Low back exam does have some loss of lordosis.  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   Patient does have tightness with straight leg test.  Patient does have worsening pain with extension of the back noted.  Neurovascularly intact distally though. Significant tightness of the hamstrings bilaterally.    Impression and Recommendations:     The above documentation has been reviewed and is accurate and complete Lyndal Pulley, DO

## 2021-09-19 ENCOUNTER — Encounter: Payer: Self-pay | Admitting: Family Medicine

## 2021-09-19 ENCOUNTER — Ambulatory Visit (INDEPENDENT_AMBULATORY_CARE_PROVIDER_SITE_OTHER): Payer: Medicare Other

## 2021-09-19 ENCOUNTER — Ambulatory Visit (INDEPENDENT_AMBULATORY_CARE_PROVIDER_SITE_OTHER): Payer: Medicare Other | Admitting: Family Medicine

## 2021-09-19 VITALS — BP 116/66 | HR 62 | Ht 71.5 in | Wt 159.0 lb

## 2021-09-19 DIAGNOSIS — M545 Low back pain, unspecified: Secondary | ICD-10-CM

## 2021-09-19 DIAGNOSIS — M25551 Pain in right hip: Secondary | ICD-10-CM

## 2021-09-19 DIAGNOSIS — M25552 Pain in left hip: Secondary | ICD-10-CM | POA: Diagnosis not present

## 2021-09-19 DIAGNOSIS — G8929 Other chronic pain: Secondary | ICD-10-CM | POA: Diagnosis not present

## 2021-09-19 IMAGING — DX DG PELVIS 1-2V
1 series · 1 of 1 positions shown · non-contrast
Comparison: None.

CLINICAL DATA: Bilateral hip pain

EXAM:
PELVIS - 1-2 VIEW

[pelvis ap]
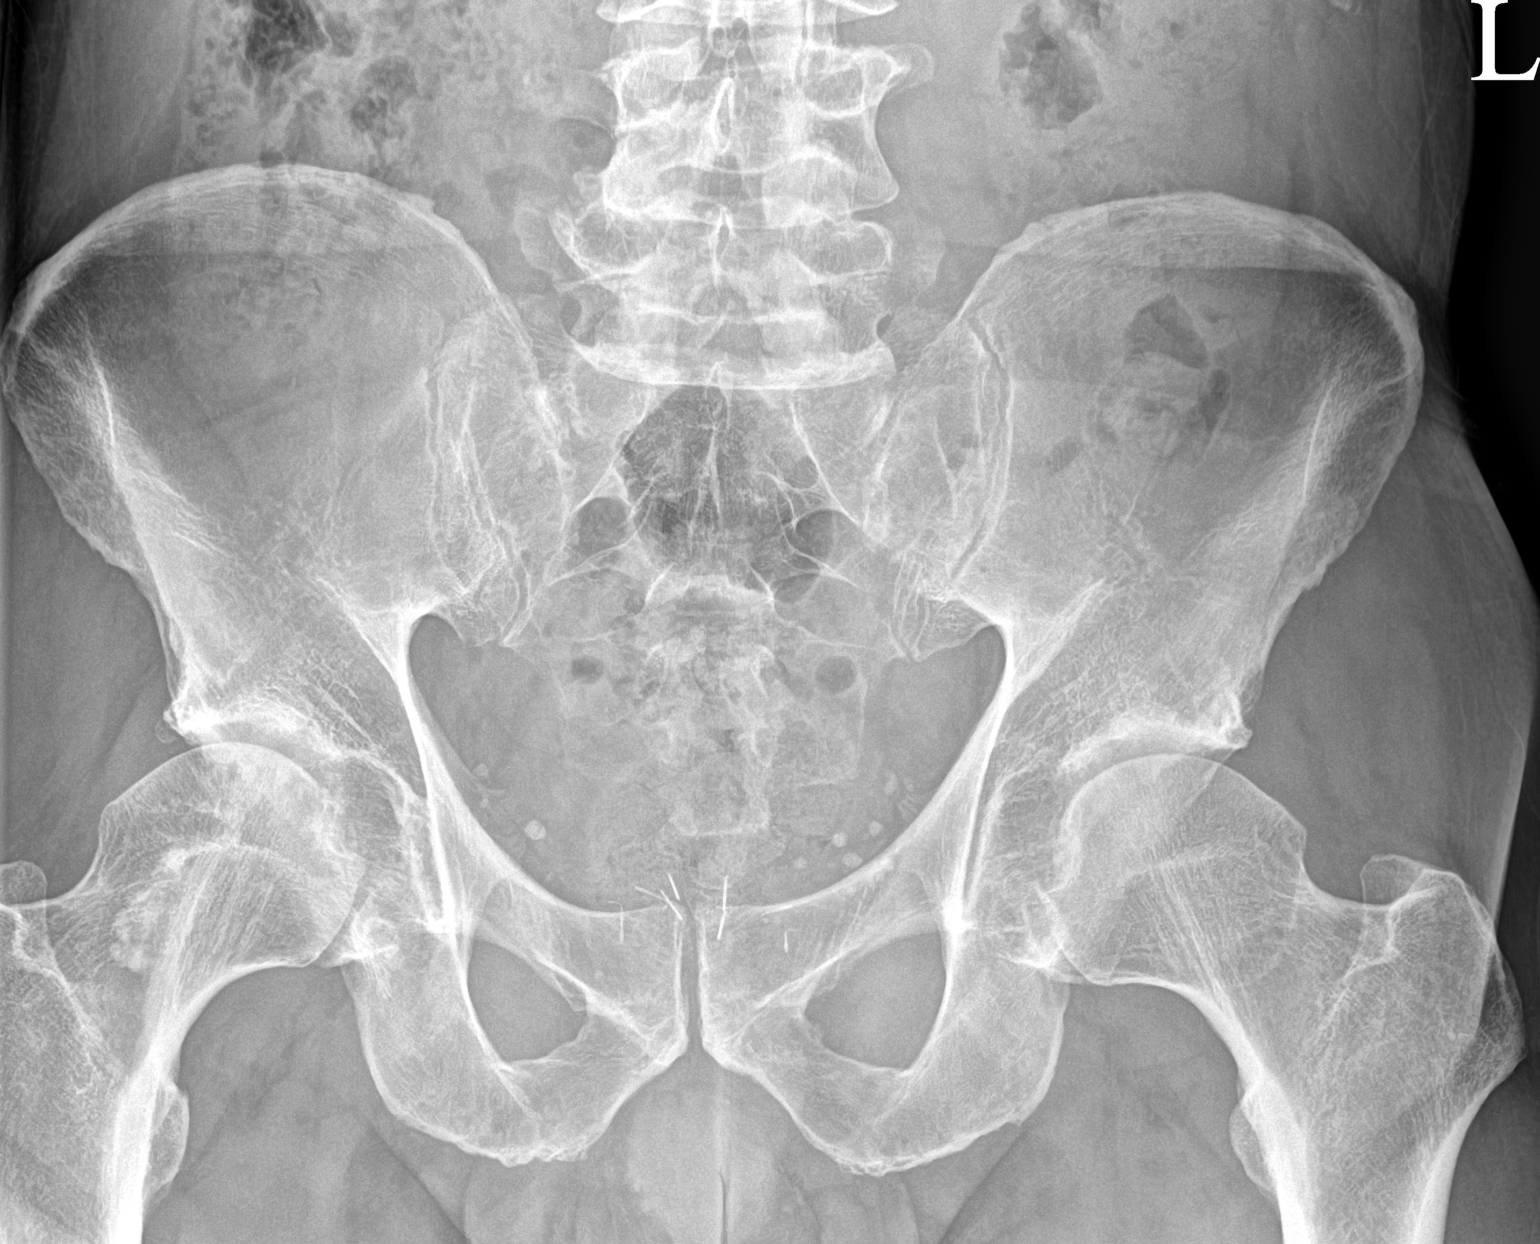

[1 of 1 positions shown; findings below may reference images not displayed]

FINDINGS: There is no evidence of pelvic fracture or diastasis. No pelvic bone
lesions are seen. Mild narrowing the bilateral hip joints with
acetabular subchondral sclerosis and early osteophytes.
Brachytherapy seeds.
IMPRESSION: No acute osseous abnormality identified.

## 2021-09-19 IMAGING — DX DG LUMBAR SPINE 2-3V
3 series · 3 of 3 positions shown · non-contrast
Comparison: None.

CLINICAL DATA: Back pain

EXAM:
LUMBAR SPINE - 2-3 VIEW

[l-spine ap]
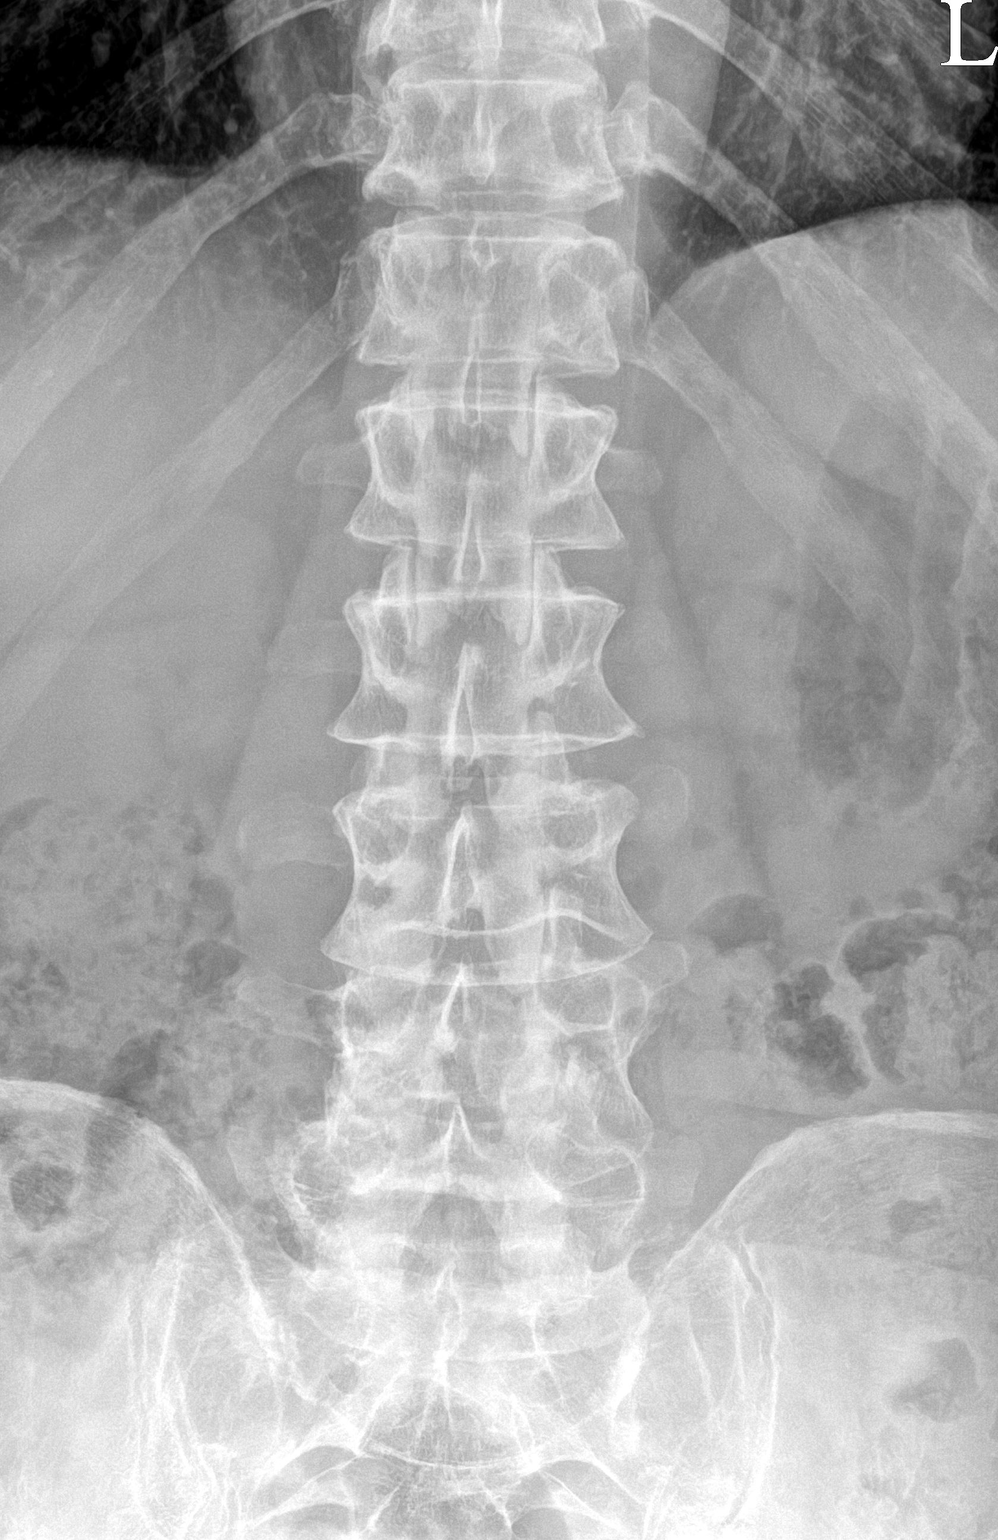

[l-spine lateral]
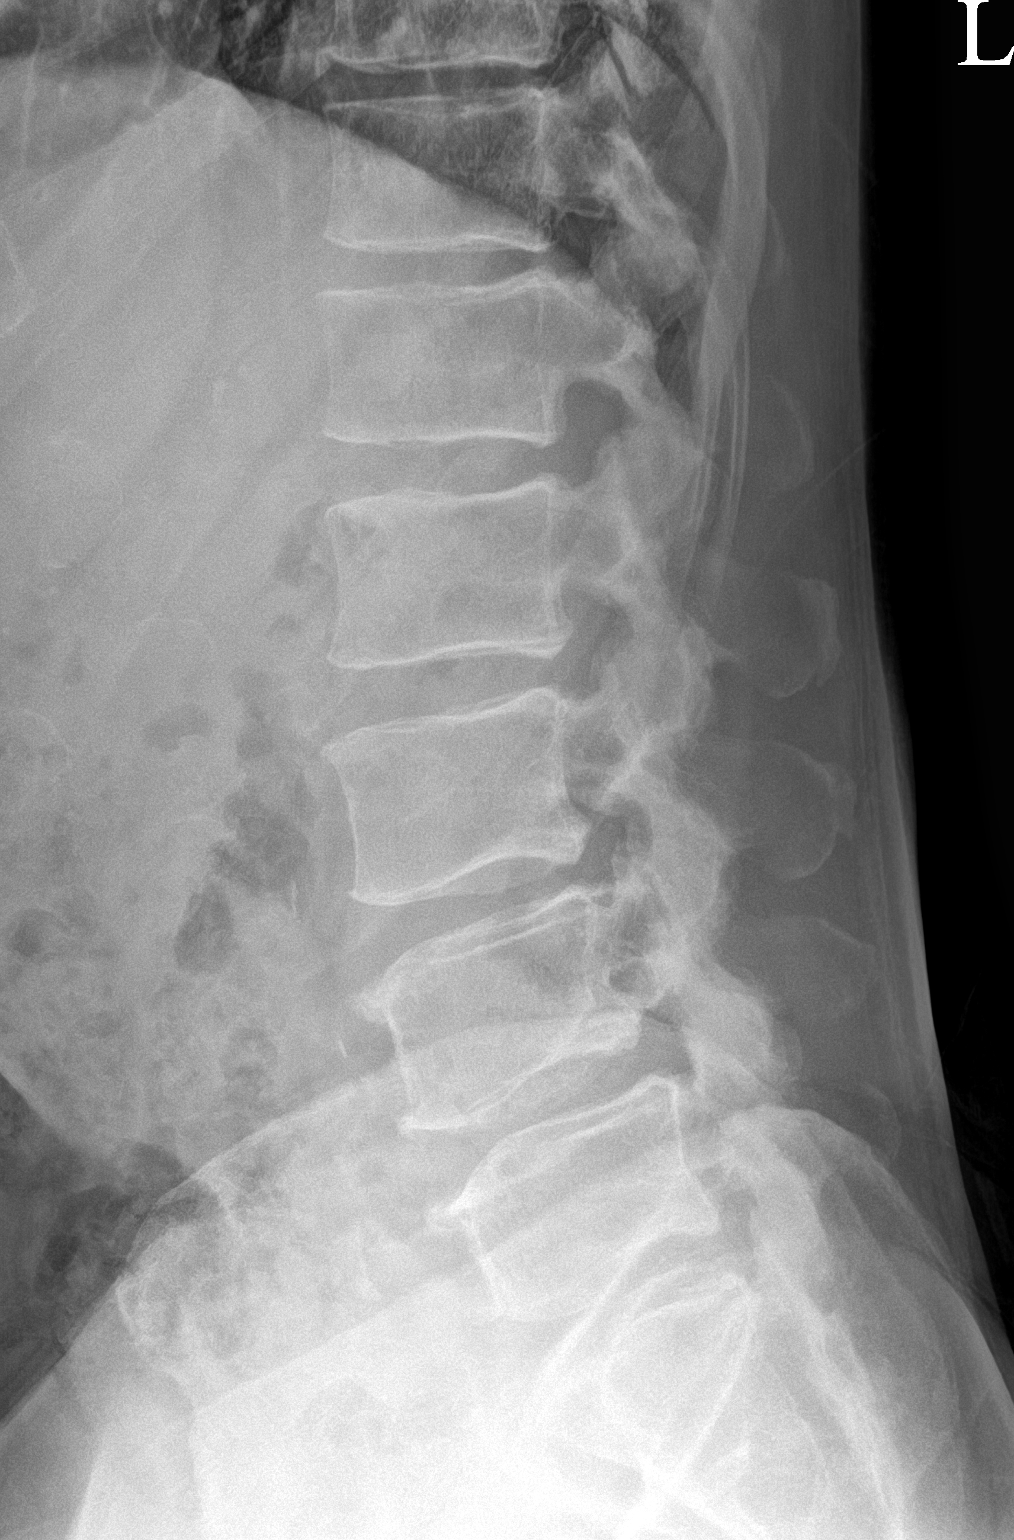

[l-spine spot]
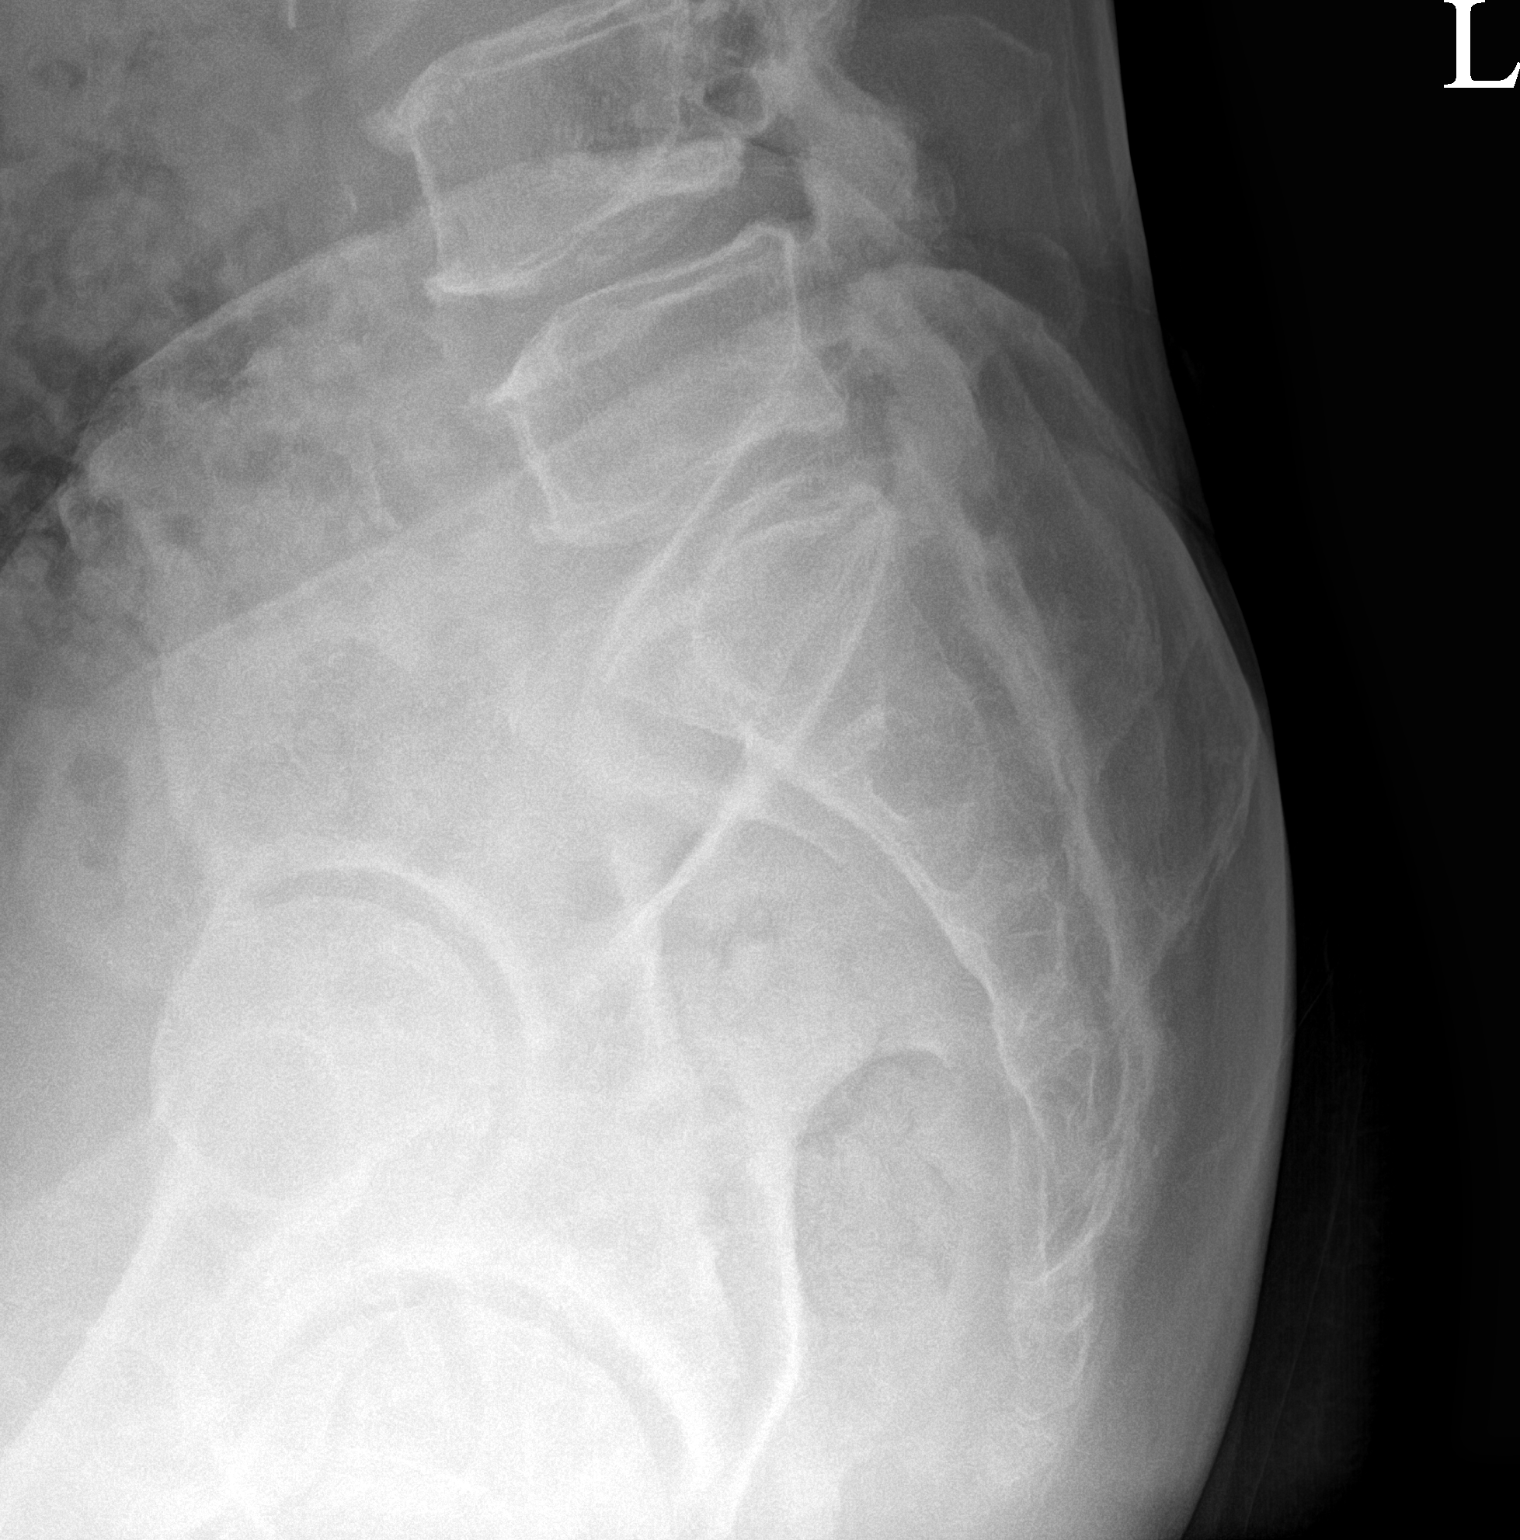

[3 of 3 positions shown; findings below may reference images not displayed]

FINDINGS: No acute fracture identified. Minimal grade 1 anterolisthesis of L4
on L5. Mild intervertebral disc space narrowing and facet
arthropathy in the lower lumbar spine.
IMPRESSION: Degenerative changes with no acute osseous abnormality identified.

## 2021-09-19 MED ORDER — GABAPENTIN 100 MG PO CAPS: 200.0000 mg | ORAL_CAPSULE | Freq: Every day | ORAL | 0 refills | Status: DC

## 2021-09-19 NOTE — Patient Instructions (Signed)
Xray today Gabapentin 200mg  at night Iron daily w Vit C 500mg  Stay active and keep working with Ander Purpura See me in 4 weeks

## 2021-09-19 NOTE — Assessment & Plan Note (Signed)
I believe the patient is having significant tightness of the hamstrings bilaterally.  Likely though this could be potentially more secondary to nerve irritation.  Given a low-dose of gabapentin.  Encourage patient to try to go daily with the iron if not making improvement in case the iron deficiency is playing a role x-rays pending.  Encourage patient to continue to stay active at this time.  Follow-up again in 4 to 6 weeks and see how patient is responding.  If any worsening symptoms advanced imaging would be warranted.

## 2021-09-20 ENCOUNTER — Encounter: Payer: Medicare Other | Admitting: Physical Therapy

## 2021-09-20 DIAGNOSIS — H3530 Unspecified macular degeneration: Secondary | ICD-10-CM | POA: Insufficient documentation

## 2021-09-25 ENCOUNTER — Other Ambulatory Visit: Payer: Self-pay

## 2021-09-25 ENCOUNTER — Ambulatory Visit (INDEPENDENT_AMBULATORY_CARE_PROVIDER_SITE_OTHER): Payer: Medicare Other | Admitting: Physical Therapy

## 2021-09-25 ENCOUNTER — Encounter: Payer: PRIVATE HEALTH INSURANCE | Admitting: Physical Therapy

## 2021-09-25 ENCOUNTER — Encounter: Payer: Self-pay | Admitting: Physical Therapy

## 2021-09-25 DIAGNOSIS — M5441 Lumbago with sciatica, right side: Secondary | ICD-10-CM

## 2021-09-25 DIAGNOSIS — M6281 Muscle weakness (generalized): Secondary | ICD-10-CM

## 2021-09-25 DIAGNOSIS — M5442 Lumbago with sciatica, left side: Secondary | ICD-10-CM

## 2021-09-25 NOTE — Therapy (Addendum)
OUTPATIENT PHYSICAL THERAPY TREATMENT NOTE   Patient Name: Darious Rehman MRN: [id number] DOB:03-04-1951, 71 y.o., male Today's Date: 09/25/2021  PCP: Inda Coke, PA REFERRING PROVIDER: Inda Coke, PA   PT End of Session - 09/25/21 1107     Visit Number 6    Number of Visits 12    Date for PT Re-Evaluation 10/02/21    Authorization Type Medicare    PT Start Time 1104    PT Stop Time 1146    PT Time Calculation (min) 42 min    Activity Tolerance Patient tolerated treatment well    Behavior During Therapy [facility] for tasks assessed/performed              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)   Past Surgical History:  Procedure Laterality Date   BREAST SURGERY  02/24/1981   Gynecomastia; date is an estimate   [place]  2018   MASTECTOMY FOR GYNECOMASTIA Left 1983   SKIN CANCER EXCISION  2018   right side of neck and left forehead   TONSILLECTOMY     Patient Active Problem List   Diagnosis Date Noted   Low back pain 09/19/2021   Depression     PCP: Inda Coke, PA   REFERRING PROVIDER: Inda Coke, PA   REFERRING DIAG: M54.30 (ICD-10-CM) - Sciatica, unspecified laterality   THERAPY DIAG:  Acute bilateral low back pain with bilateral sciatica      SUBJECTIVE:          Pt states continued pain, notes minimal change. Reports pain 2/10 in LEs, up to 4/10 in the morning and in the shower.     PERTINENT HISTORY: benign enlarged prostate with baseline urgency, no other changes in bowel/bladder.    PAIN:  Are you having pain? Yes VAS scale:  2/10 Pain location: Bil LEs Pain orientation: Bilateral  PAIN TYPE: aching Pain description: intermittent  Aggravating factors: Bending, squatting, forward flexion Relieving factors: none stated       PATIENT GOALS : decreased pain in bil LEs.        OBJECTIVE:       LUMBARAROM/PROM   A/PROM AROM 08/21/2021    Flexion Mod limitation      Extension Mod limitation    Right lateral flexion Mild limitation/pain    Left lateral flexion Mild limitation    Right rotation      Left rotation       (Blank rows = not tested)        TODAY'S TREATMENT  09/25/21:  Therapeutic Exercise: Aerobic:  recumbent bike x 7 min.   Supine:  Hip flexor stretch 30 sec x 3 off side of table and in standing; DKTC 30 sec x 3; Cat /camel (instructed to come to neutral instead of into extension ) x 20 ;   Seated nerve glides (slump position) x 20 bil; Seated hamstring stretch with ankle pump x 20 bil; Reviewed optimal bending/lifting with squat and lunge type motion.    Neuromuscular Re-education:  Manual Therapy: Long leg distraction for lumbar traction x 2 min bil  Therapeutic Activity:    09/18/21 Therapeutic Exercise:   Supine: DKC on ball x2 1 minute, LTR 2 minutes on ball, hamstring isometrics 5" holds B 2 minutes bridges 2x5, 5" holds,   Neuromuscular Re-education: TRA activation - verbal and tactile cues - isometric then with  SLR - 10 minutes practice Manual Therapy: Lumbar traction on ball 5 minutes  Therapeutic Activity: lifting mechanics in lunge form - 8 minutes of practice and cues       PATIENT EDUCATION:  Education details: reviewed and updated HEP, form and rationale for exercises, on how and why to perform different exercises Person educated: Patient Education method: Explanation, Demonstration, Tactile cues, Verbal cues, and Handouts Education comprehension: verbalized understanding, returned demonstration, verbal cues required, tactile cues required, and needs further education     HOME EXERCISE PROGRAM: Access Code: [email or web address]       ASSESSMENT:   CLINICAL IMPRESSION:  09/25/21:  Pt continues to have bothersome pain. It is low in intensity but has not changed much with interventions. Updated and reviewed HEP today. Pt with difficulty  achieving hip flexor stretch today due to increased pain in back. He will benefit from skilled PT to continue ther ex for mobility, relief of back pain, and for progression of strengthening.    Objective impairments include decreased activity tolerance, decreased mobility, decreased ROM, decreased strength, hypomobility, increased muscle spasms, impaired flexibility, and pain. These impairments are limiting patient from community activity, meal prep, yard work, shopping, and yard work. Personal factors including Time since onset of injury/illness/exacerbation are also affecting patient's functional outcome. Patient will benefit from skilled PT to address above impairments and improve overall function.       REHAB POTENTIAL: Good   CLINICAL DECISION MAKING: Stable/uncomplicated   EVALUATION COMPLEXITY: Low     GOALS: Goals reviewed with patient? Yes   SHORT TERM GOALS:   STG Name Target Date Goal status  1 Pt to be independent with initial HEP 09/04/21 INITIAL  2        3        4        5        6        7           LONG TERM GOALS:    LTG Name Target Date Goal status  1 Pt to be independent with final HEP  Baseline: 10/02/21 INITIAL  2 Pt to demo improved lumbar flexion ROM, to be [facility] for pt age, and non painful, to improve ability for ADLS.  Baseline: 10/02/21 INITIAL  3 Pt to report decreased pain in back and bil LE s  to be 0-2/10 with standing and bending activity, to improve ability for IADLs and volunteer work.  Baseline: 10/02/21 INITIAL  4 Pt to report ability for standing/walking activity for at  least 1 hr, with pain no greater than 2/10 In LE s . Baseline: 10/02/21 INITIAL                                 PLAN: PT FREQUENCY: 2x/week   PT DURATION:  6 weeks   PLANNED INTERVENTIONS: Therapeutic exercises, Therapeutic activity, Neuro Muscular re-education, Balance training, Gait training, Patient/Family education, Joint mobilization, Dry Needling, Electrical  stimulation, Spinal mobilization, Cryotherapy, Moist heat, Taping, Traction, Ultrasound, Ionotophoresis 4mg /ml Dexamethasone, and Manual therapy   Lyndee Hensen, PT, DPT 11:50 AM  09/25/21

## 2021-09-27 ENCOUNTER — Encounter: Payer: PRIVATE HEALTH INSURANCE | Admitting: Physical Therapy

## 2021-09-28 ENCOUNTER — Encounter: Payer: Self-pay | Admitting: Physician Assistant

## 2021-09-30 ENCOUNTER — Encounter: Payer: Self-pay | Admitting: Physical Therapy

## 2021-09-30 ENCOUNTER — Ambulatory Visit (INDEPENDENT_AMBULATORY_CARE_PROVIDER_SITE_OTHER): Payer: Medicare Other | Admitting: Physical Therapy

## 2021-09-30 ENCOUNTER — Other Ambulatory Visit: Payer: Self-pay

## 2021-09-30 DIAGNOSIS — M5441 Lumbago with sciatica, right side: Secondary | ICD-10-CM

## 2021-09-30 DIAGNOSIS — R262 Difficulty in walking, not elsewhere classified: Secondary | ICD-10-CM

## 2021-09-30 DIAGNOSIS — M5442 Lumbago with sciatica, left side: Secondary | ICD-10-CM | POA: Diagnosis not present

## 2021-09-30 DIAGNOSIS — M6281 Muscle weakness (generalized): Secondary | ICD-10-CM | POA: Diagnosis not present

## 2021-09-30 DIAGNOSIS — M5430 Sciatica, unspecified side: Secondary | ICD-10-CM

## 2021-09-30 NOTE — Therapy (Signed)
OUTPATIENT PHYSICAL THERAPY TREATMENT NOTE PHYSICAL THERAPY DISCHARGE SUMMARY  Visits from Start of Care: 7  Current functional level related to goals / functional outcomes: 1/1 STG met, 2/4 LTG MET   Remaining deficits: Continue    Education / Equipment: See below   Patient agrees to discharge. Patient goals were partially met. Patient is being discharged due to lack of progress.   Patient Name: Muhammed Teutsch MRN: [id number] DOB:[birth year], 71 y.o., male Today's Date: 09/30/2021  PCP: Inda Coke, [place] REFERRING PROVIDER: Inda Coke, PA   PT End of Session - 09/30/21 0930     Visit Number 7    Number of Visits 12    Date for PT Re-Evaluation 10/02/21    Authorization Type Medicare    PT Start Time 0931    PT Stop Time 1014    PT Time Calculation (min) 43 min    Activity Tolerance Patient tolerated treatment well    Behavior During Therapy [facility] for tasks assessed/performed              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)   Past Surgical History:  Procedure Laterality Date   BREAST SURGERY  02/24/1981   Gynecomastia; date is an estimate   [place]  2018   MASTECTOMY FOR GYNECOMASTIA Left 1983   SKIN CANCER EXCISION  2018   right side of neck and left forehead   TONSILLECTOMY     Patient Active Problem List   Diagnosis Date Noted   Low back pain 09/19/2021   Depression     PCP: Inda Coke, PA   REFERRING PROVIDER: Inda Coke, PA   REFERRING DIAG: M54.30 (ICD-10-CM) - Sciatica, unspecified laterality   THERAPY DIAG:  Acute bilateral low back pain with bilateral sciatica      SUBJECTIVE:         State overall he feels about 10% better but not significantly better since the start of therapy. Has been doing exercises religiously with minimal change. Showering and cleaning water off of sides of shower still very  painful in legs and buttocks. Follows up with MD on 10/16/21.   PERTINENT HISTORY: benign enlarged prostate with baseline urgency, no other changes in bowel/bladder.    PAIN:  Are you having pain? Yes VAS scale:  6/10 at worst Pain location: Bil LEs Pain orientation: Bilateral  PAIN TYPE: aching Pain description: intermittent  Aggravating factors: Bending, squatting, forward flexion Relieving factors: none stated       PATIENT GOALS : decreased pain in bil LEs.        OBJECTIVE:      LUMBARAROM/PROM   A/PROM AROM 08/21/2021 AROM 09/30/21   Flexion Mod limitation   Mod limitation*   Extension Mod limitation  Mod limitation*   Right lateral flexion Mild limitation/pain  Mild limitation no pain  Left lateral flexion Mild limitation  Mild limitation no pain  Right rotation      Left rotation       (Blank rows = not tested) *Pain in buttocks and legs        TODAY'S TREATMENT  09/30/21 Therapeutic Exercise:  Aerobic: Supine:marching with TA, piriformis stretch 2x 30" holds, SKC x2 30" holds, LTR x15 Bila, thomas stretch x2 30" holds B Prone:  Seated: hamstring stretch x30" holds x2Bilat  Standing: Neuromuscular Re-education: Manual Therapy: Therapeutic Activity: Self Care: Trigger Point  Dry Needling:  Modalities:          PATIENT EDUCATION:  Education details: reviewed and updated HEP, form and rationale for exercises, on how and why to perform different exercises, on progress made, on POC moving forward Person educated: Patient Education method: Explanation, Demonstration, Tactile cues, Verbal cues, and Handouts Education comprehension: verbalized understanding, returned demonstration, verbal cues required, tactile cues required, and needs further education     HOME EXERCISE PROGRAM: Access Code: [email or web address]       ASSESSMENT:   CLINICAL IMPRESSION:  09/25/21:  Pt continues to have bothersome pain. It is low in intensity but has not changed much with  interventions. Updated and reviewed HEP today. Pt with difficulty achieving hip flexor stretch today due to increased pain in back. He will benefit from skilled PT to continue ther ex for mobility, relief of back pain, and for progression of strengthening.    Patient with minimal progress in pain since that start of session but demonstrated improved functional endurance with walking compared to when he started. Discussed continuing HEP and following up with MD in regards to next steps. Discussed how strengthening benefits can take time to present. Answered all questions and patient to discharge from PT to HEP a this time.        REHAB POTENTIAL: Good   CLINICAL DECISION MAKING: Stable/uncomplicated   EVALUATION COMPLEXITY: Low     GOALS: Goals reviewed with patient? Yes   SHORT TERM GOALS:   STG Name Target Date Goal status  1 Pt to be independent with initial HEP 09/04/21 MET  _0 LONG TERM GOALS:    LTG Name Target Date Goal status  1 Pt to be independent with final HEP  Baseline: 10/02/21 MET  2 Pt to demo improved lumbar flexion ROM, to be [facility] for pt age, and non painful, to improve ability for ADLS.  Baseline: 10/02/21 NOT MET  3 Pt to report decreased pain in back and bil LE s  to be 0-2/10 with standing and bending activity, to improve ability for IADLs and volunteer work.  Baseline: 6/10 pain at worst but average about 3/10 10/02/21 NOT MET  4 Pt to report ability for standing/walking activity for at  least 1 hr, with pain no greater than 2/10 In LE s . Baseline:stays at a 2 with walking 10/02/21 MET                                 PLAN: PT FREQUENCY: 2x/week   PT DURATION:  6 weeks   PLANNED INTERVENTIONS: Therapeutic exercises, Therapeutic activity, Neuro Muscular re-education, Balance training, Gait training, Patient/Family education, Joint mobilization, Dry Needling, Electrical stimulation, Spinal  mobilization, Cryotherapy, Moist heat, Taping, Traction, Ultrasound, Ionotophoresis 32m/ml Dexamethasone, and Manual therapy   12:11 PM, 09/30/21 MJerene Pitch DPT Physical Therapy with [facility] [phone]

## 2021-10-02 ENCOUNTER — Encounter: Payer: Self-pay | Admitting: Physician Assistant

## 2021-10-02 ENCOUNTER — Ambulatory Visit (INDEPENDENT_AMBULATORY_CARE_PROVIDER_SITE_OTHER): Payer: Medicare Other | Admitting: Physician Assistant

## 2021-10-02 ENCOUNTER — Encounter: Payer: Medicare Other | Admitting: Physical Therapy

## 2021-10-02 ENCOUNTER — Other Ambulatory Visit: Payer: Self-pay

## 2021-10-02 VITALS — BP 118/70 | HR 81 | Temp 97.6°F | Ht 71.5 in | Wt 158.0 lb

## 2021-10-02 DIAGNOSIS — L659 Nonscarring hair loss, unspecified: Secondary | ICD-10-CM | POA: Diagnosis not present

## 2021-10-02 DIAGNOSIS — R790 Abnormal level of blood mineral: Secondary | ICD-10-CM | POA: Diagnosis not present

## 2021-10-02 DIAGNOSIS — R6889 Other general symptoms and signs: Secondary | ICD-10-CM | POA: Diagnosis not present

## 2021-10-02 LAB — CBC WITH DIFFERENTIAL/PLATELET
Basophils Absolute: 0 10*3/uL (ref 0.0–0.1)
Basophils Relative: 0.7 % (ref 0.0–3.0)
Eosinophils Absolute: 0.1 10*3/uL (ref 0.0–0.7)
Eosinophils Relative: 1.2 % (ref 0.0–5.0)
HCT: 40.3 % (ref 39.0–52.0)
Hemoglobin: 13.3 g/dL (ref 13.0–17.0)
Lymphocytes Relative: 23.6 % (ref 12.0–46.0)
Lymphs Abs: 1.5 10*3/uL (ref 0.7–4.0)
MCHC: 32.9 g/dL (ref 30.0–36.0)
MCV: 88.1 fl (ref 78.0–100.0)
Monocytes Absolute: 0.6 10*3/uL (ref 0.1–1.0)
Monocytes Relative: 10.2 % (ref 3.0–12.0)
Neutro Abs: 4 10*3/uL (ref 1.4–7.7)
Neutrophils Relative %: 64.3 % (ref 43.0–77.0)
Platelets: 204 10*3/uL (ref 150.0–400.0)
RBC: 4.57 Mil/uL (ref 4.22–5.81)
RDW: 13.9 % (ref 11.5–15.5)
WBC: 6.2 10*3/uL (ref 4.0–10.5)

## 2021-10-02 LAB — B12 AND FOLATE PANEL
Folate: 18.6 ng/mL (ref >5.9)
Vitamin B-12: 400 pg/mL (ref 211–911)

## 2021-10-02 LAB — IBC + FERRITIN
Ferritin: 23.4 ng/mL (ref 22.0–322.0)
Iron: 25 ug/dL — ABNORMAL LOW (ref 42–165)
Saturation Ratios: 6.2 % — ABNORMAL LOW (ref 20.0–50.0)
TIBC: 400.4 ug/dL (ref 250.0–450.0)
Transferrin: 286 mg/dL (ref 212.0–360.0)

## 2021-10-02 LAB — TSH: TSH: 0.81 u[IU]/mL (ref 0.35–5.50)

## 2021-10-02 NOTE — Progress Notes (Signed)
Andrew Benitez is a 71 y.o. male here for a follow up of a pre-existing problem.  History of Present Illness:   Chief Complaint  Patient presents with   Alopecia    Pt c/o hair loss for long time.    HPI  Hair loss This has been going on for a while. He has tried topical rogaine which has not really been helpful. He states that he has read oral minoxidil could be effective -- he would like to trial this.  Low Ferritin/Anemia Patient reports that Dr. Charlann Boxer told him to start taking his oral iron daily with daily vitamin C. He has started doing this. He does admit that he donates blood every 8 weeks. Last donated 4 weeks ago. Has almost donated a total of 11 gallons. Does have history of having blood in his stool 5 years ago on a screening test. Had subsequent colonoscopy and endoscopy was normal. Swallowed a camera and this was was also normal. Has had two normal IFOB tests since. Denies: unusual fatigue, weight changes, appetite.  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)     Social History   Tobacco Use   Smoking status: Never   Smokeless tobacco: Never  Vaping Use   Vaping Use: Never used  Substance Use Topics   Alcohol use: Yes    Alcohol/week: 1.0 standard drink    Types: 1 Cans of beer per week    Comment: one beer every  week   Drug use: Yes    Frequency: 2.0 times per week    Types: Marijuana    Comment: once a week    Past Surgical History:  Procedure Laterality Date   BREAST SURGERY  02/24/1981   Gynecomastia; date is an estimate   CYSTOSCOPY WITH INSERTION OF UROLIFT  2018   MASTECTOMY FOR GYNECOMASTIA Left 1983   SKIN CANCER EXCISION  2018   right side of neck and left forehead   TONSILLECTOMY      Family History  Problem Relation Age of Onset   Alcohol abuse Mother    Arthritis Mother    COPD Mother    Vision loss Mother    Alcohol abuse Father    Depression  Father    Early death Father    Breast cancer Sister    Alcohol abuse Sister    Depression Sister    Asthma Sister    Hyperlipidemia Sister    Heart attack Paternal Grandmother     No Known Allergies  Current Medications:   Current Outpatient Medications:    buPROPion (WELLBUTRIN XL) 300 MG 24 hr tablet, Take 1 tablet (300 mg total) by mouth daily., Disp: 90 tablet, Rfl: 1   DULoxetine (CYMBALTA) 30 MG capsule, Take 1 capsule (30 mg total) by mouth daily., Disp: 90 capsule, Rfl: 1   ferrous sulfate 325 (65 FE) MG tablet, Take 325 mg by mouth daily in the afternoon., Disp: , Rfl:    FIBER PO, Take 1 each by mouth in the morning and at bedtime. Tablespoon, Disp: , Rfl:    gabapentin (NEURONTIN) 100 MG capsule, Take 2 capsules (200 mg total) by mouth at bedtime., Disp: 180 capsule, Rfl: 0   Multiple Vitamins-Minerals (PRESERVISION AREDS 2) CAPS, Take 2 capsules by mouth daily in the afternoon., Disp: , Rfl:    Omega-3 Fatty Acids (FISH OIL) 1000 MG CAPS, Take 1  capsule by mouth daily., Disp: , Rfl:    ARIPiprazole (ABILIFY) 5 MG tablet, Take 0.5 tablets (2.5 mg total) by mouth daily., Disp: 45 tablet, Rfl: 1   Review of Systems:   ROS Negative unless otherwise specified per HPI.  Vitals:   Vitals:   10/02/21 1340  BP: 118/70  Pulse: 81  Temp: 97.6 F (36.4 C)  TempSrc: Temporal  SpO2: 99%  Weight: 158 lb (71.7 kg)  Height: 5' 11.5" (1.816 m)     Body mass index is 21.73 kg/m.  Physical Exam:   Physical Exam Vitals and nursing note reviewed.  Constitutional:      General: He is not in acute distress.    Appearance: He is well-developed. He is not ill-appearing or toxic-appearing.  Cardiovascular:     Rate and Rhythm: Normal rate and regular rhythm.     Pulses: Normal pulses.     Heart sounds: Normal heart sounds, S1 normal and S2 normal.  Pulmonary:     Effort: Pulmonary effort is normal.     Breath sounds: Normal breath sounds.  Skin:    General: Skin is warm  and dry.  Neurological:     Mental Status: He is alert.     GCS: GCS eye subscore is 4. GCS verbal subscore is 5. GCS motor subscore is 6.  Psychiatric:        Speech: Speech normal.        Behavior: Behavior normal. Behavior is cooperative.    Assessment and Plan:   Hair loss Uncontrolled Update TSH and iron stores I do think holding his blood donations for now could help  Also, continue oral iron and vitamin C intake Reviewed with Dr. Jerline Pain -- recommend trialing oral finasteride  Follow-up after 1 year trial, sooner if concerns  Low ferritin; Other general symptoms and signs Update iron panel, folate and B12 panel Hold blood donations for now Continue oral iron and vitamin C intake Repeat/trend blood work in 3 months If ongoing concerns with blood work or new sx at 3 month follow-up, patient agreeable to hematology referral -- declines at this time  Inda Coke, PA-C

## 2021-10-02 NOTE — Patient Instructions (Signed)
It was great to see you!  We will update your blood work today  Continue oral iron and vitamin C  Hold blood donations for now  We will repeat blood work in 3 months and then determine need for hematology input (if you decide you would like a referral to them in the meantime, please let me know)  I will be in touch re: minoxidil. I think it will be fine, just want to okay with my supervising MD. I will message you about this with your blood work results.  Let's follow-up in 3 months, sooner if you have concerns.  Take care,  Inda Coke PA-C

## 2021-10-04 ENCOUNTER — Other Ambulatory Visit: Payer: Self-pay

## 2021-10-04 ENCOUNTER — Encounter: Payer: Self-pay | Admitting: Physician Assistant

## 2021-10-04 ENCOUNTER — Other Ambulatory Visit: Payer: Self-pay | Admitting: Physician Assistant

## 2021-10-04 ENCOUNTER — Telehealth (INDEPENDENT_AMBULATORY_CARE_PROVIDER_SITE_OTHER): Payer: Medicare Other | Admitting: Physician Assistant

## 2021-10-04 VITALS — Ht 71.5 in | Wt 158.0 lb

## 2021-10-04 DIAGNOSIS — H1031 Unspecified acute conjunctivitis, right eye: Secondary | ICD-10-CM | POA: Diagnosis not present

## 2021-10-04 MED ORDER — AMOXICILLIN-POT CLAVULANATE 875-125 MG PO TABS: 1.0000 | ORAL_TABLET | Freq: Two times a day (BID) | ORAL | 0 refills | Status: DC

## 2021-10-04 MED ORDER — FINASTERIDE 1 MG PO TABS: 1.0000 mg | ORAL_TABLET | Freq: Every day | ORAL | 3 refills | Status: DC

## 2021-10-04 NOTE — Progress Notes (Signed)
I acted as a Education administrator for [employer], PA-C Anselmo Pickler, LPN  Virtual Visit via Video Note   I, Inda Coke, connected with  Andrew Benitez  ([id number], 01/18/1951) on 10/04/21 at  2:00 PM EST by a video-enabled telemedicine application and verified that I am speaking with the correct person using two identifiers.  Location: Patient: Home Provider: [place] office   I discussed the limitations of evaluation and management by telemedicine and the availability of in person appointments. The patient expressed understanding and agreed to proceed.    History of Present Illness: Andrew Benitez is a 71 y.o. who identifies as a male who was assigned male at birth, and is being seen today for eye problem. Pt c/o right eye is red and draining yellow drainage started this morning. Pt says his wife is a Education officer, museum and grandchildren had it few weeks ago and they have been babysitting.  Denies: fevers, chills, cough, n/v   Problems:  Patient Active Problem List   Diagnosis Date Noted   Macular degeneration of both eyes 09/20/2021   Low back pain 09/19/2021   Depression     Allergies: No Known Allergies Medications:  Current Outpatient Medications:    amoxicillin-clavulanate (AUGMENTIN) 875-125 MG tablet, Take 1 tablet by mouth 2 (two) times daily., Disp: 20 tablet, Rfl: 0   buPROPion (WELLBUTRIN XL) 300 MG 24 hr tablet, Take 1 tablet (300 mg total) by mouth daily., Disp: 90 tablet, Rfl: 1   DULoxetine (CYMBALTA) 30 MG capsule, Take 1 capsule (30 mg total) by mouth daily., Disp: 90 capsule, Rfl: 1   ferrous sulfate 325 (65 FE) MG tablet, Take 325 mg by mouth daily in the afternoon., Disp: , Rfl:    FIBER PO, Take 1 each by mouth in the morning and at bedtime. Tablespoon, Disp: , Rfl:    finasteride (PROPECIA) 1 MG tablet, Take 1 tablet (1 mg total) by mouth daily., Disp: 90 tablet, Rfl: 3   gabapentin (NEURONTIN) 100 MG capsule, Take 2 capsules (200 mg total) by mouth at  bedtime., Disp: 180 capsule, Rfl: 0   Multiple Vitamins-Minerals (PRESERVISION AREDS 2) CAPS, Take 2 capsules by mouth daily in the afternoon., Disp: , Rfl:    Omega-3 Fatty Acids (FISH OIL) 1000 MG CAPS, Take 1 capsule by mouth daily., Disp: , Rfl:    vitamin C (ASCORBIC ACID) 500 MG tablet, Take 500 mg by mouth daily., Disp: , Rfl:    ARIPiprazole (ABILIFY) 5 MG tablet, Take 0.5 tablets (2.5 mg total) by mouth daily., Disp: 45 tablet, Rfl: 1  Observations/Objective: Patient is well-developed, well-nourished in no acute distress.  Resting comfortably  at home.  R upper eyelid with erythema and swelling; obvious injected conjunctiva; purulent discharge; normal EOM Head is normocephalic, atraumatic.  No labored breathing.  Speech is clear and coherent with logical content.  Patient is alert and oriented at baseline.    Assessment and Plan: 1. Acute conjunctivitis of right eye, unspecified acute conjunctivitis type New No red flags on discussion with patient -- no painful EOMs Due to swelling and drainage will treat with oral augmentin Follow-up if new/worsening symptoms  Follow Up Instructions: I discussed the assessment and treatment plan with the patient. The patient was provided an opportunity to ask questions and all were answered. The patient agreed with the plan and demonstrated an understanding of the instructions.  A copy of instructions were sent to the patient via MyChart unless otherwise noted below.   The patient  was advised to call back or seek an in-person evaluation if the symptoms worsen or if the condition fails to improve as anticipated.  Inda Coke, [place]

## 2021-10-06 ENCOUNTER — Emergency Department (HOSPITAL_COMMUNITY)
Admission: EM | Admit: 2021-10-06 | Discharge: 2021-10-06 | Disposition: A | Discharge status: Home / Self Care | Payer: Medicare Other | Attending: Emergency Medicine | Admitting: Emergency Medicine

## 2021-10-06 ENCOUNTER — Other Ambulatory Visit: Payer: Self-pay

## 2021-10-06 ENCOUNTER — Emergency Department (HOSPITAL_COMMUNITY): Payer: Medicare Other

## 2021-10-06 ENCOUNTER — Encounter (HOSPITAL_COMMUNITY): Payer: Self-pay

## 2021-10-06 DIAGNOSIS — S0501XA Injury of conjunctiva and corneal abrasion without foreign body, right eye, initial encounter: Secondary | ICD-10-CM | POA: Diagnosis not present

## 2021-10-06 DIAGNOSIS — L03213 Periorbital cellulitis: Secondary | ICD-10-CM

## 2021-10-06 DIAGNOSIS — X58XXXA Exposure to other specified factors, initial encounter: Secondary | ICD-10-CM | POA: Diagnosis not present

## 2021-10-06 DIAGNOSIS — H05011 Cellulitis of right orbit: Secondary | ICD-10-CM | POA: Diagnosis not present

## 2021-10-06 DIAGNOSIS — S0591XA Unspecified injury of right eye and orbit, initial encounter: Secondary | ICD-10-CM | POA: Diagnosis present

## 2021-10-06 LAB — CBC WITH DIFFERENTIAL/PLATELET
Abs Immature Granulocytes: 0.02 10*3/uL (ref 0.00–0.07)
Basophils Absolute: 0.1 10*3/uL (ref 0.0–0.1)
Basophils Relative: 1 %
Eosinophils Absolute: 0.1 10*3/uL (ref 0.0–0.5)
Eosinophils Relative: 2 %
HCT: 43.2 % (ref 39.0–52.0)
Hemoglobin: 14.1 g/dL (ref 13.0–17.0)
Immature Granulocytes: 0 %
Lymphocytes Relative: 23 %
Lymphs Abs: 1.3 10*3/uL (ref 0.7–4.0)
MCH: 29.4 pg (ref 26.0–34.0)
MCHC: 32.6 g/dL (ref 30.0–36.0)
MCV: 90.2 fL (ref 80.0–100.0)
Monocytes Absolute: 0.5 10*3/uL (ref 0.1–1.0)
Monocytes Relative: 9 %
Neutro Abs: 3.8 10*3/uL (ref 1.7–7.7)
Neutrophils Relative %: 65 %
Platelets: 268 10*3/uL (ref 150–400)
RBC: 4.79 MIL/uL (ref 4.22–5.81)
RDW: 12.6 % (ref 11.5–15.5)
WBC: 5.7 10*3/uL (ref 4.0–10.5)
nRBC: 0 % (ref 0.0–0.2)

## 2021-10-06 LAB — BASIC METABOLIC PANEL
Anion gap: 5 (ref 5–15)
BUN: 17 mg/dL (ref 8–23)
CO2: 29 mmol/L (ref 22–32)
Calcium: 9.1 mg/dL (ref 8.9–10.3)
Chloride: 102 mmol/L (ref 98–111)
Creatinine, Ser: 0.82 mg/dL (ref 0.61–1.24)
GFR, Estimated: >60 mL/min (ref >60)
Glucose, Bld: 100 mg/dL — ABNORMAL HIGH (ref 70–99)
Potassium: 5.1 mmol/L (ref 3.5–5.1)
Sodium: 136 mmol/L (ref 135–145)

## 2021-10-06 IMAGING — CT CT ORBITS W/ CM
3 series · 14 of 47 positions shown, 16 images · IV contrast (agent unspecified)
Comparison: No pertinent prior exam.

CLINICAL DATA: Retinal disorder. Right eye pain with
conjunctivitis.

EXAM:
CT ORBITS WITH CONTRAST
TECHNIQUE: Multidetector CT images was performed according to the standard
protocol following intravenous contrast administration.

[Series 3: orbits 2.0 st · axial · 0.40mm/px · z∈[-104,-26]mm · 8 of 47 slices shown, 10 images]
[im 4/47  brain]
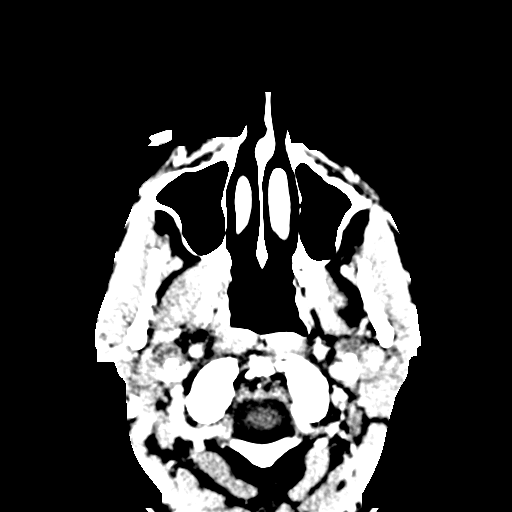
[im 4/47  bone]
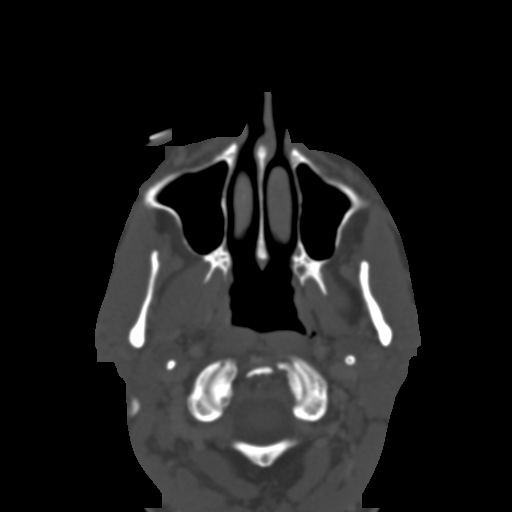
[im 10/47  bone]
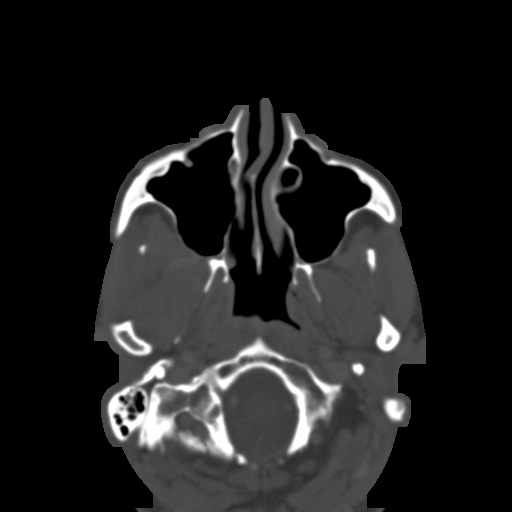
[im 15/47  bone]
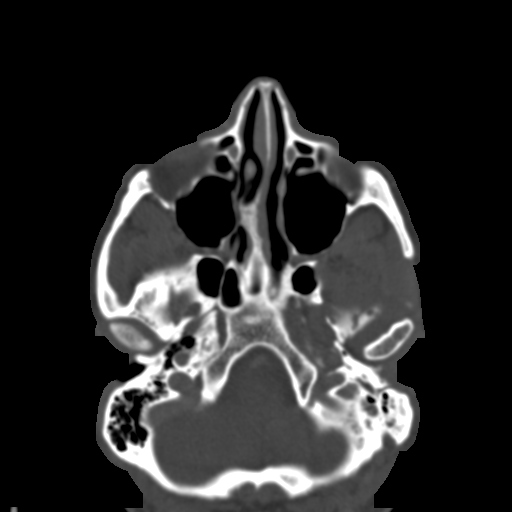
[im 21/47  bone]
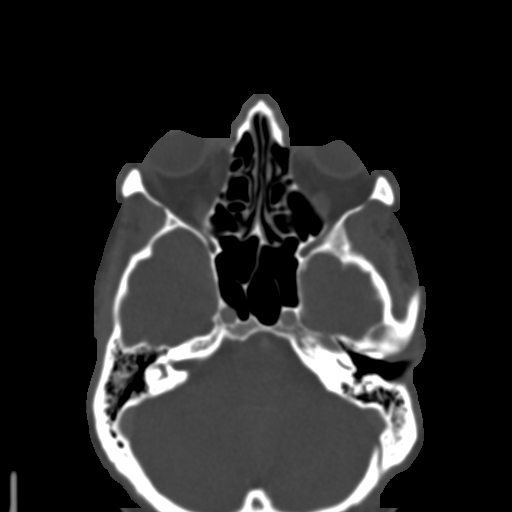
[im 26/47  brain]
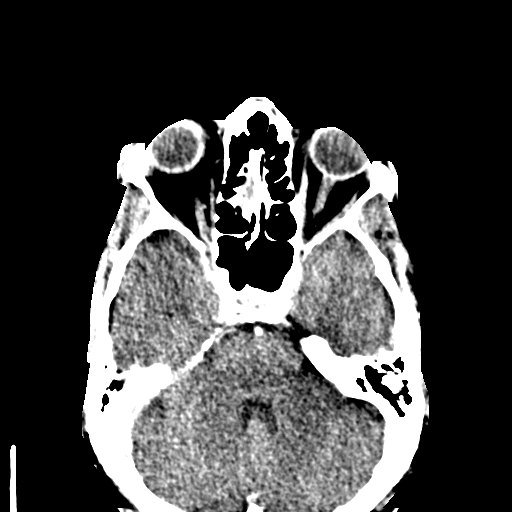
[im 26/47  bone]
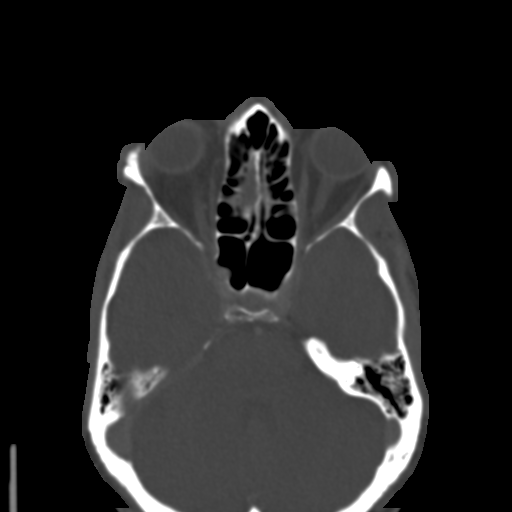
[im 32/47  bone]
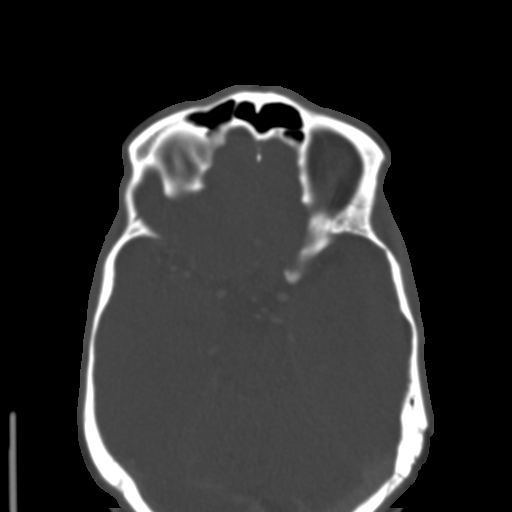
[im 37/47  bone]
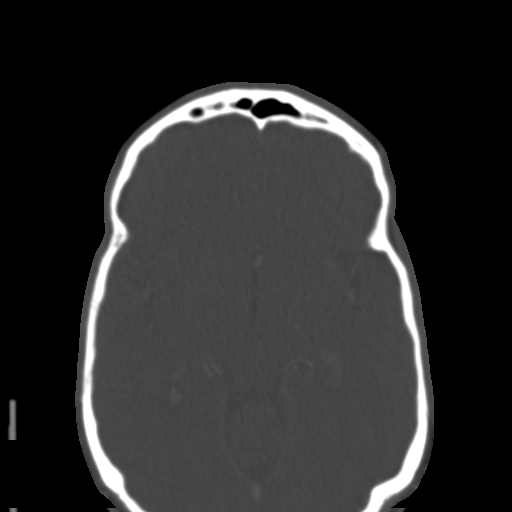
[im 43/47  bone]
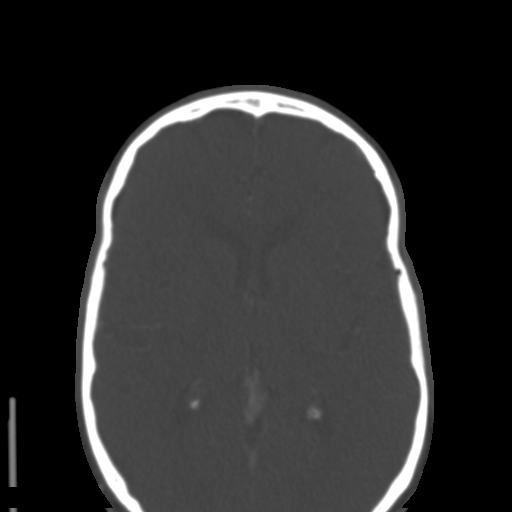

[Series 7: orbits coronal st · coronal · 0.21mm/px · 3 of 84 slices shown]
[im 28/84  bone]
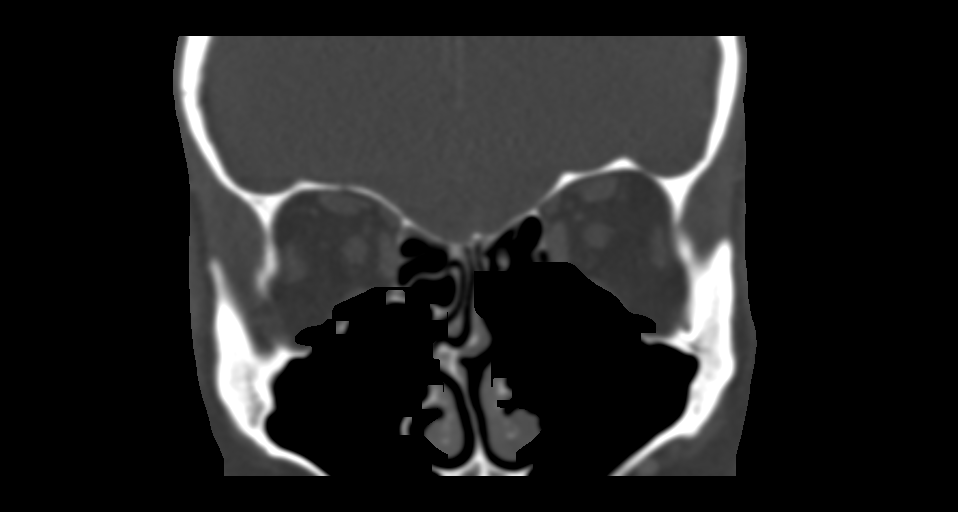
[im 37/84  bone]
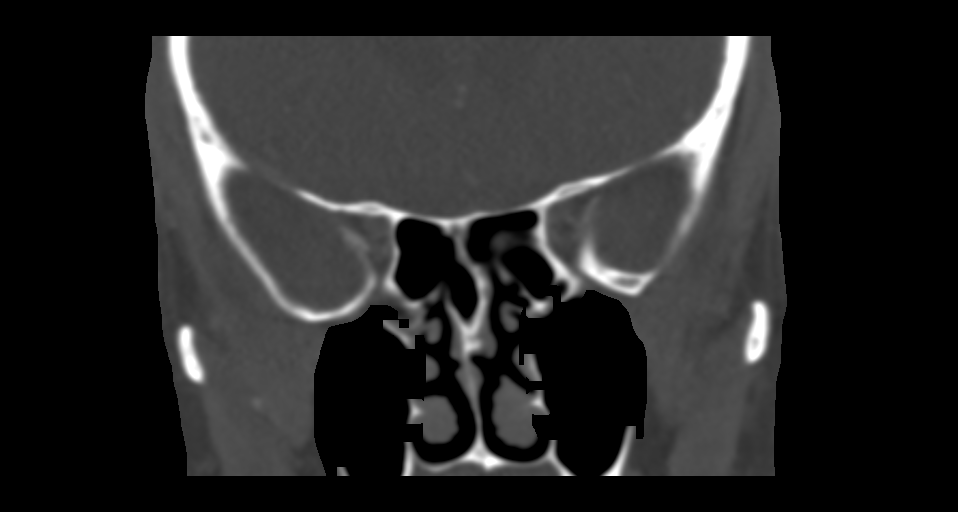
[im 47/84  bone]
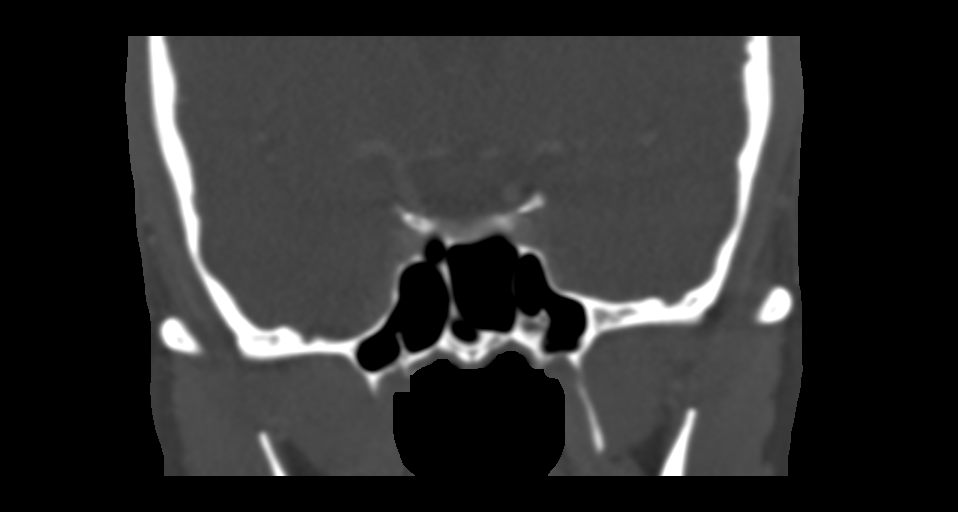

[Series 8: orbits sagittal st · sagittal · 0.21mm/px · 3 of 85 slices shown]
[im 29/85  bone]
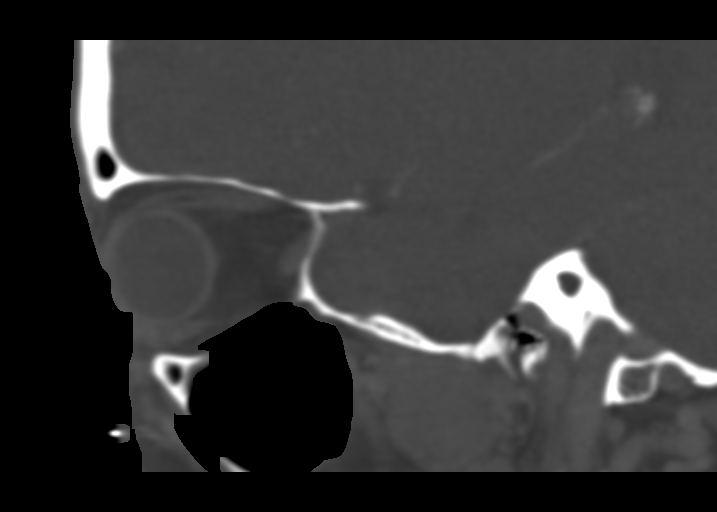
[im 43/85  bone]
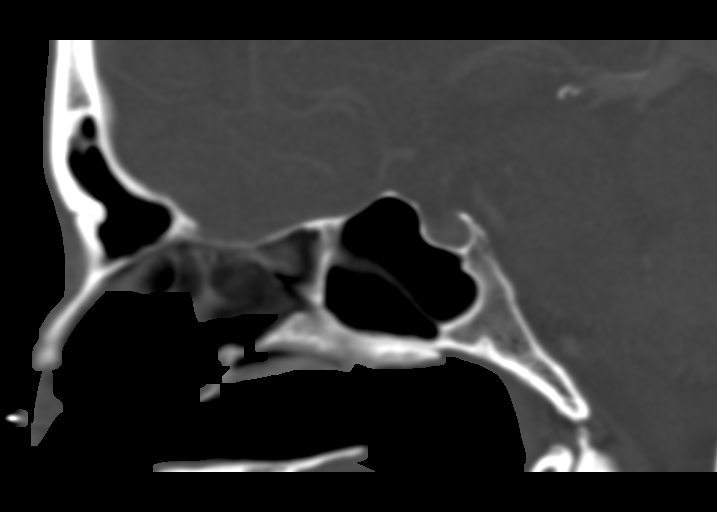
[im 57/85  bone]
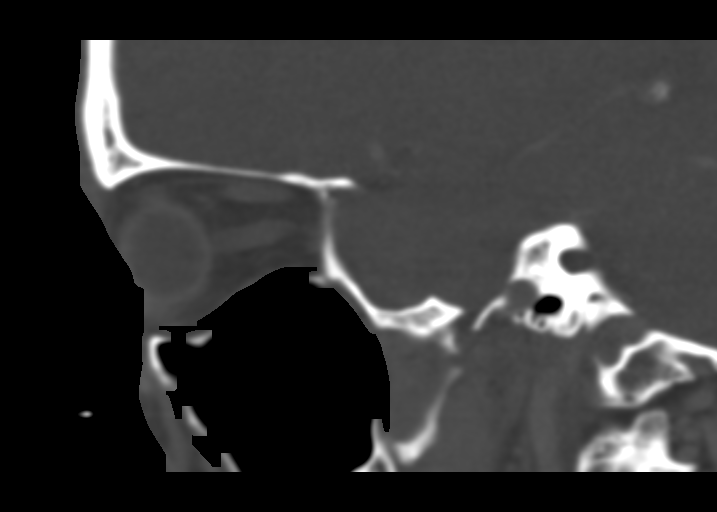

[14 of 47 positions shown; findings below may reference images not displayed]

RADIATION DOSE REDUCTION: This exam was performed according to the
departmental dose-optimization program which includes automated
exposure control, adjustment of the mA and/or kV according to
patient size and/or use of iterative reconstruction technique.

CONTRAST:  75mL OMNIPAQUE IOHEXOL 300 MG/ML  SOLN
FINDINGS: Orbits: Globes normal bilaterally without thickening or abnormal
enhancement. Vitreous normal. Lens normal. Optic nerve normal in
caliber bilaterally. Extraocular muscles normal. Orbital fat normal
without edema or mass. Normal optic nerves and cavernous sinus.

Visible paranasal sinuses: Mild mucosal edema paranasal sinuses.
Left mastoid effusion

Soft tissues: Mild skin thickening of the right eyelid.

Osseous: No fracture or skeletal lesion.

Limited intracranial: Negative
IMPRESSION: Mild thickening of the eyelid on the right consistent with preseptal
cellulitis. No orbital mass or abscess.

## 2021-10-06 MED ORDER — IOHEXOL 300 MG/ML  SOLN
75.0000 mL | Freq: Once | INTRAMUSCULAR | Status: AC | PRN
  Administered 2021-10-06: 75 mL via INTRAVENOUS

## 2021-10-06 MED ORDER — SULFAMETHOXAZOLE-TRIMETHOPRIM 800-160 MG PO TABS: 1.0000 | ORAL_TABLET | Freq: Two times a day (BID) | ORAL | 0 refills | Status: AC

## 2021-10-06 MED ORDER — TETRACAINE HCL 0.5 % OP SOLN
1.0000 [drp] | Freq: Once | OPHTHALMIC | Status: AC
  Administered 2021-10-06: 1 [drp] via OPHTHALMIC
  Filled 2021-10-06: qty 4

## 2021-10-06 MED ORDER — FLUORESCEIN SODIUM 1 MG OP STRP
1.0000 | ORAL_STRIP | Freq: Once | OPHTHALMIC | Status: AC
  Administered 2021-10-06: 1 via OPHTHALMIC
  Filled 2021-10-06: qty 1

## 2021-10-06 MED ORDER — SULFAMETHOXAZOLE-TRIMETHOPRIM 800-160 MG PO TABS
1.0000 | ORAL_TABLET | Freq: Once | ORAL | Status: AC
  Administered 2021-10-06: 1 via ORAL
  Filled 2021-10-06: qty 1

## 2021-10-06 MED ORDER — ERYTHROMYCIN 5 MG/GM OP OINT
TOPICAL_OINTMENT | Freq: Once | OPHTHALMIC | Status: AC
  Filled 2021-10-06: qty 3.5

## 2021-10-06 NOTE — ED Provider Notes (Signed)
[facility] DEPT Provider Note   CSN: [id number] Arrival date & time: 10/06/21  1313     History  Chief Complaint  Patient presents with   Eye Pain    Andrew Benitez is a 71 y.o. male.  The history is provided by the patient and medical records.   Patient is a 71 year old man with newly diagnosed macular degeneration presenting with right eye erythema and pain.    2 days ago on 10/04/2021 patient had a painless red eye and upper eyelid swelling.  The right eye was matted shut with yellow discharge, was seen through telehealth visit with his primary care provider who started him on oral Augmentin.  Patient reports the swelling to the upper eyelid improved, started taking Augmentin at 4:00 that evening and has not missed any doses.  States yesterday he started having pain with upward gaze.  Also is having symptoms spreading to the left eye including watery drainage, upper eyelid mild swelling. Today he reported to urgent care who were concerned about possible periorbital cellulitis.  He was given a shot of Rocephin at that time and advised to go to the ED.    Denies any vision loss or changes, floaters, flashing lights, photophobia. Pain only reproduced with vertical gaze. Does not wear contacts or glasses.  No fevers, no nausea or vomiting.  Patient did babysit his grandson who had bacterial conjunctivitis week ago.  Home Medications Prior to Admission medications   Medication Sig Start Date End Date Taking? Authorizing Provider  amoxicillin-clavulanate (AUGMENTIN) 875-125 MG tablet Take 1 tablet by mouth 2 (two) times daily. 10/04/21   Inda Coke, PA  ARIPiprazole (ABILIFY) 5 MG tablet Take 0.5 tablets (2.5 mg total) by mouth daily. 06/26/21 09/24/21  Inda Coke, PA  buPROPion (WELLBUTRIN XL) 300 MG 24 hr tablet Take 1 tablet (300 mg total) by mouth daily. 06/26/21   Inda Coke, PA  DULoxetine (CYMBALTA) 30 MG capsule Take 1 capsule (30 mg  total) by mouth daily. 06/26/21   Inda Coke, PA  ferrous sulfate 325 (65 FE) MG tablet Take 325 mg by mouth daily in the afternoon.    [provider]  FIBER PO Take 1 each by mouth in the morning and at bedtime. Tablespoon    [provider]  finasteride (PROPECIA) 1 MG tablet Take 1 tablet (1 mg total) by mouth daily. 10/04/21   Inda Coke, PA  gabapentin (NEURONTIN) 100 MG capsule Take 2 capsules (200 mg total) by mouth at bedtime. 09/19/21   Lyndal Pulley, DO  Multiple Vitamins-Minerals (PRESERVISION AREDS 2) CAPS Take 2 capsules by mouth daily in the afternoon. 09/20/21   [provider]  Omega-3 Fatty Acids (FISH OIL) 1000 MG CAPS Take 1 capsule by mouth daily.    [provider]  vitamin C (ASCORBIC ACID) 500 MG tablet Take 500 mg by mouth daily.    [provider]      Allergies    Patient has no known allergies.    Review of Systems   Review of Systems  Constitutional:  Negative for fever.  Eyes:  Positive for pain and redness. Negative for photophobia.   Physical Exam Updated Vital Signs BP (!) 151/93    Pulse 67    Temp 98.1 F (36.7 C) (Oral)    Resp 18    Ht 5\' 11"  (1.803 m)    Wt 68.9 kg    SpO2 100%    BMI 21.20 kg/m  Physical Exam Vitals  and nursing note reviewed. Exam conducted with a chaperone present.  Constitutional:      General: He is not in acute distress.    Appearance: Normal appearance.  HENT:     Head: Normocephalic and atraumatic.  Eyes:     General: Gaze aligned appropriately. No scleral icterus.       Right eye: Discharge present.        Left eye: Discharge present.    Extraocular Movements: Extraocular movements intact.     Right eye: Normal extraocular motion and no nystagmus.     Left eye: Normal extraocular motion and no nystagmus.     Conjunctiva/sclera:     Right eye: Right conjunctiva is injected. Chemosis present.     Left eye: Chemosis present.     Pupils: Pupils are equal, round,  and reactive to light.     Comments: EOMI but pain with vertical gaze.  Clear lacrimation to the right eye and to a lesser extent to the left.  Right upper edema. Chemosis, no proptosis.Mild upper lid edema to the left upper lid.   Skin:    Coloration: Skin is not jaundiced.  Neurological:     Mental Status: He is alert. Mental status is at baseline.     Coordination: Coordination normal.    ED Results / Procedures / Treatments   Labs (all labs ordered are listed, but only abnormal results are displayed) Labs Reviewed  BASIC METABOLIC PANEL - Abnormal; Notable for the following components:      Result Value   Glucose, Bld 100 (*)    All other components within normal limits  CBC WITH DIFFERENTIAL/PLATELET    EKG None  Radiology CT Orbits W Contrast  Result Date: 10/06/2021 CLINICAL DATA:  Retinal disorder. Right eye pain with conjunctivitis. EXAM: CT ORBITS WITH CONTRAST TECHNIQUE: Multidetector CT images was performed according to the standard protocol following intravenous contrast administration. RADIATION DOSE REDUCTION: This exam was performed according to the departmental dose-optimization program which includes automated exposure control, adjustment of the mA and/or kV according to patient size and/or use of iterative reconstruction technique. CONTRAST:  107mL OMNIPAQUE IOHEXOL 300 MG/ML  SOLN COMPARISON:  No pertinent prior exam. FINDINGS: Orbits: Globes normal bilaterally without thickening or abnormal enhancement. Vitreous normal. Lens normal. Optic nerve normal in caliber bilaterally. Extraocular muscles normal. Orbital fat normal without edema or mass. Normal optic nerves and cavernous sinus. Visible paranasal sinuses: Mild mucosal edema paranasal sinuses. Left mastoid effusion Soft tissues: Mild skin thickening of the right eyelid. Osseous: No fracture or skeletal lesion. Limited intracranial: Negative IMPRESSION: Mild thickening of the eyelid on the right consistent with  preseptal cellulitis. No orbital mass or abscess. Electronically Signed   By: Franchot Gallo M.D.   On: 10/06/2021 15:27    Procedures Procedures    Medications Ordered in ED Medications  fluorescein ophthalmic strip 1 strip (has no administration in time range)  tetracaine (PONTOCAINE) 0.5 % ophthalmic solution 1 drop (has no administration in time range)  erythromycin ophthalmic ointment (has no administration in time range)  sulfamethoxazole-trimethoprim (BACTRIM DS) 800-160 MG per tablet 1 tablet (has no administration in time range)  iohexol (OMNIPAQUE) 300 MG/ML solution 75 mL (75 mLs Intravenous Contrast Given 10/06/21 1450)    ED Course/ Medical Decision Making/ A&P                           Medical Decision Making Amount and/or Complexity of Data Reviewed Labs:  ordered. Radiology: ordered.  Risk Prescription drug management.   This patient presents to the ED for concern of right eye pain and erythema. This involves an extensive number of treatment options, and is a complaint that carries with it a high risk of complications and morbidity.  The differential diagnosis includes, but is not limited to: Preseptal/orbital cellulitis, bacterial or viral conjunctivitis, ocular ischemia, dacryocystitis, uveitis, corneal ulcer, retrobulbar process  Patient is a 71 year-old male with history of recently diagnosed macular degeneration presenting with right eye pain.  Vital signs are stable, no systemic symptoms.  Does not wear contacts or glasses.  Physical exam shows obviously injected right eye with clear lacrimation.  There is chemosis to the upper lid, no surroundings swelling or tenderness.  EOMI but pain with vertical gaze.  There is no nystagmus.  Left eye with some mild upper lid edema. No proptosis  No trauma or vision loss.  IOP: 16 Fluorescein: Small area of fluorescein reuptake consistent with a corneal abrasion at about 7:00.  Visual acuity: Intact visual acuity.   Bilateral 20/25 near, 2525 far.  Right 20/25 near, right distance 25/25.  Left nare 20 out of 25.  Left distance 25/25  Additional history obtained: -External records from outside source obtained and reviewed including: Chart review including previous notes, labs, imaging, consultation notes -I personally reviewed his primary care telehealth visit on 10/04/2021.  I also reviewed his urgent care visit from earlier today.  On oral amoxicillin, swelling is improved somewhat but the pain in the eye started yesterday.  Given a shot of Rocephin today, has not had any topical antibiotics.   Lab Tests: -I ordered, reviewed, and interpreted labs.  The pertinent results include: CBC without any leukocytosis or anemia.  BMP without any gross electrolyte derangement.   Imaging Studies ordered: -I ordered imaging studies including CT orbits with contrast -I independently visualized and interpreted imaging which showed preseptal cellulitis of the right eye -I agree with the radiologist interpretation   Medicines ordered and prescription drug management: -I ordered medication including erythromycin ointment  for corneal abrasion  -Reevaluation of the patient after these medicines showed that the patient stayed the same -I have reviewed the patients home medicines and have made adjustments as needed   ED Course: Patient is a preseptal cellulitis of the right upper lid.  Additionally corneal abrasion noted on fluorescein exam.  We will have him continue taking the Augmentin and add Bactrim so he is full coverage.  erythromycin ointment also given in the ED and advised.  Patient has follow-up with his ophthalmologist tomorrow, return precautions agreed on.   Dispostion: Discharge  Discussed HPI, physical exam and plan of care for this patient with attending Calvert Cantor. The attending physician evaluated this patient as part of a shared visit and agrees with plan of care.          Final  Clinical Impression(s) / ED Diagnoses Final diagnoses:  Preseptal cellulitis of right eye  Right corneal abrasion, initial encounter    Rx / DC Orders ED Discharge Orders     None         Sherrill Raring, Hershal Coria 10/06/21 2158    Truddie Hidden, MD 10/06/21 2308

## 2021-10-06 NOTE — Discharge Instructions (Addendum)
1-looks like you do have an abrasion on your cornea.  Apply the erythromycin ointment to the lower lid twice daily to help this heal.   2-you have a preseptal cellulitis, continue taking the Augmentin as prescribed.  You were going to add Bactrim.  Take Bactrim twice daily for the next 7 days.  This should provide appropriate coverage for the infection. 3-follow-up with ophthalmology tomorrow for reevaluation. 4-Return to the ED for vision loss or concerning new symptoms.

## 2021-10-06 NOTE — ED Triage Notes (Signed)
PT dx with conjunctivities on Friday in right eye. Was seen at [facility] today for worsening pain in right eye and was told to come to ER. Pt is on amoxicillin and given rocephin in office today. Pain is constant.

## 2021-10-06 NOTE — ED Notes (Signed)
Dc instructions and scripts reviewed with pt no questions or concerns at this time. Will follow up with opthalmology tomorrow.

## 2021-10-07 ENCOUNTER — Other Ambulatory Visit: Payer: Self-pay | Admitting: Physician Assistant

## 2021-10-07 NOTE — Telephone Encounter (Signed)
Please see message. °

## 2021-10-07 NOTE — Telephone Encounter (Signed)
Spoke to pt told him there is nothing we can do about insurance, but you can get Rx thru [facility] and pay out of pocket. [name] looked it up and [facility] has it for $1.00. So check [facility] and let me know if you need Rx sent to another pharmacy if not just print out coupon and take to pharmacy when you go to have filled. Pt verbalized understanding and will let me know what he wants to do.

## 2021-10-11 MED ORDER — FINASTERIDE 1 MG PO TABS: 1.0000 mg | ORAL_TABLET | Freq: Every day | ORAL | 3 refills | Status: DC

## 2021-10-11 NOTE — Telephone Encounter (Signed)
Pt called back and said he would like Finasteride sent to [facility] pt okay I will send it now. Pt verbalized understanding.

## 2021-10-15 NOTE — Progress Notes (Signed)
[facility] Phone: [phone] Subjective:   [name], am serving as a scribe for Dr. Hulan Saas.This visit occurred during the SARS-CoV-2 public health emergency.  Safety protocols were in place, including screening questions prior to the visit, additional usage of staff PPE, and extensive cleaning of exam room while observing appropriate contact time as indicated for disinfecting solutions.  I'm seeing this patient by the request  of:  Inda Coke, [place]  CC: Low back pain  XNA:[id number]  09/19/2021 I believe the patient is having significant tightness of the hamstrings bilaterally.  Likely though this could be potentially more secondary to nerve irritation.  Given a low-dose of gabapentin.  Encourage patient to try to go daily with the iron if not making improvement in case the iron deficiency is playing a role x-rays pending.  Encourage patient to continue to stay active at this time.  Follow-up again in 4 to 6 weeks and see how patient is responding.  If any worsening symptoms advanced imaging would be warranted.  Update 10/16/2021 Codylee Patil is a 71 y.o. male coming in with complaint of LBP. Patient states that his back pain is unchanged. Feeling some pain into the R leg down to the knee today. Patient notes trouble getting to his feet. Patient has been doing rehab exercises one hour a day. Also using gabapentin.   Xray lumbar 09/19/2021 IMPRESSION: Degenerative changes with no acute osseous abnormality identified.   Xray pelvis 09/19/2021 IMPRESSION: No acute osseous abnormality identified.    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)   Past Surgical History:  Procedure Laterality Date   BREAST SURGERY  02/24/1981   Gynecomastia; date is an estimate   CYSTOSCOPY WITH INSERTION OF UROLIFT  2018   MASTECTOMY FOR  GYNECOMASTIA Left 1983   SKIN CANCER EXCISION  2018   right side of neck and left forehead   TONSILLECTOMY     Social History   Socioeconomic History   Marital status: Married    Spouse name: Not on file   Number of children: Not on file   Years of education: Not on file   Highest education level: Not on file  Occupational History   Not on file  Tobacco Use   Smoking status: Never   Smokeless tobacco: Never  Vaping Use   Vaping Use: Never used  Substance and Sexual Activity   Alcohol use: Yes    Alcohol/week: 1.0 standard drink    Types: 1 Cans of beer per week    Comment: one beer every  week   Drug use: Yes    Frequency: 2.0 times per week    Types: Marijuana    Comment: once a week   Sexual activity: Not Currently    Comment: Vasectomy  Other Topics Concern   Not on file  Social History Narrative   Married   Two children, one in [place]   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   Social Determinants of Radio broadcast assistant Strain: Low Risk    Difficulty of Paying Living Expenses: Not hard at all  Food Insecurity: No Food Insecurity   Worried About Charity fundraiser in the Last Year: Never true   Arboriculturist in the Last Year: Never true  Transportation Needs: No Transportation Needs   Lack of  Transportation (Medical): No   Lack of Transportation (Non-Medical): No  Physical Activity: Insufficiently Active   Days of Exercise per Week: 7 days   Minutes of Exercise per Session: 20 min  Stress: No Stress Concern Present   Feeling of Stress : Not at all  Social Connections: Moderately Integrated   Frequency of Communication with Friends and Family: More than three times a week   Frequency of Social Gatherings with Friends and Family: Once a week   Attends Religious Services: Never   Marine scientist or Organizations: Yes   Attends Music therapist: More than 4 times per year   Marital Status: Married   No Known Allergies Family History   Problem Relation Age of Onset   Alcohol abuse Mother    Arthritis Mother    COPD Mother    Vision loss Mother    Alcohol abuse Father    Depression Father    Early death Father    Breast cancer Sister    Alcohol abuse Sister    Depression Sister    Asthma Sister    Hyperlipidemia Sister    Heart attack Paternal Grandmother         Current Outpatient Medications (Hematological):    ferrous sulfate 325 (65 FE) MG tablet, Take 325 mg by mouth daily in the afternoon.  Current Outpatient Medications (Other):    amoxicillin-clavulanate (AUGMENTIN) 875-125 MG tablet, Take 1 tablet by mouth 2 (two) times daily.   buPROPion (WELLBUTRIN XL) 300 MG 24 hr tablet, Take 1 tablet (300 mg total) by mouth daily.   DULoxetine (CYMBALTA) 30 MG capsule, Take 1 capsule (30 mg total) by mouth daily.   FIBER PO, Take 1 each by mouth in the morning and at bedtime. Tablespoon   finasteride (PROPECIA) 1 MG tablet, Take 1 tablet (1 mg total) by mouth daily.   gabapentin (NEURONTIN) 100 MG capsule, Take 2 capsules (200 mg total) by mouth at bedtime.   Multiple Vitamins-Minerals (PRESERVISION AREDS 2) CAPS, Take 2 capsules by mouth daily in the afternoon.   Omega-3 Fatty Acids (FISH OIL) 1000 MG CAPS, Take 1 capsule by mouth daily.   vitamin C (ASCORBIC ACID) 500 MG tablet, Take 500 mg by mouth daily.   ARIPiprazole (ABILIFY) 5 MG tablet, Take 0.5 tablets (2.5 mg total) by mouth daily.   Reviewed prior external information including notes and imaging from  primary care provider As well as notes that were available from care everywhere and other healthcare systems.  Past medical history, social, surgical and family history all reviewed in electronic medical record.  No pertanent information unless stated regarding to the chief complaint.   Review of Systems:  No headache, visual changes, nausea, vomiting, diarrhea, constipation, dizziness, abdominal pain, skin rash, fevers, chills, night sweats,  weight loss, swollen lymph nodes, body aches, joint swelling, chest pain, shortness of breath, mood changes. POSITIVE muscle aches  Objective  Blood pressure 128/88, pulse 67, height 5\' 11"  (1.803 m), weight 157 lb (71.2 kg), SpO2 95 %.   General: No apparent distress alert and oriented x3 mood and affect normal, dressed appropriately.  HEENT: Pupils equal, extraocular movements intact  Respiratory: Patient's speak in full sentences and does not appear short of breath  Cardiovascular: No lower extremity edema, non tender, no erythema  Gait normal with good balance and coordination.  MSK: Low back exam does have some loss of lordosis.  Patient has decreased range of motion even from previous exam.  Does have  weakness noted of the lower extremity with 4 out of 5 strength but seems to be symmetric.  Has less than 5 degrees of extension of the back with worsening pain.    Impression and Recommendations:     The above documentation has been reviewed and is accurate and complete Lyndal Pulley, DO

## 2021-10-16 ENCOUNTER — Other Ambulatory Visit: Payer: Self-pay

## 2021-10-16 ENCOUNTER — Encounter: Payer: Self-pay | Admitting: Family Medicine

## 2021-10-16 ENCOUNTER — Ambulatory Visit (INDEPENDENT_AMBULATORY_CARE_PROVIDER_SITE_OTHER): Payer: Medicare Other | Admitting: Family Medicine

## 2021-10-16 VITALS — BP 128/88 | HR 67 | Ht 71.0 in | Wt 157.0 lb

## 2021-10-16 DIAGNOSIS — G8929 Other chronic pain: Secondary | ICD-10-CM

## 2021-10-16 DIAGNOSIS — M545 Low back pain, unspecified: Secondary | ICD-10-CM

## 2021-10-16 DIAGNOSIS — M25552 Pain in left hip: Secondary | ICD-10-CM | POA: Diagnosis not present

## 2021-10-16 DIAGNOSIS — M25551 Pain in right hip: Secondary | ICD-10-CM | POA: Diagnosis not present

## 2021-10-16 NOTE — Assessment & Plan Note (Signed)
Chronic problem with worsening symptoms at this time.  I do concerned that patient does have significant spinal stenosis.  Seems to be worsening at this moment.  Patient has had difficulty also with daily activities at this moment.  Patient has difficulty with the process we will add a MRI of the pelvis secondary to patient having significant increase in tightness of the pelvic musculature.  Depending on findings we will discuss further medical management.  Follow-up with me after the imaging to discuss.

## 2021-10-16 NOTE — Patient Instructions (Addendum)
MRI lumbar [id number] MRI pelvis w/wo We will be in touch

## 2021-11-12 ENCOUNTER — Ambulatory Visit
Admission: RE | Admit: 2021-11-12 | Discharge: 2021-11-12 | Disposition: A | Discharge status: Home / Self Care | Payer: Medicare Other | Source: Ambulatory Visit | Attending: Family Medicine | Admitting: Family Medicine

## 2021-11-12 DIAGNOSIS — M25552 Pain in left hip: Secondary | ICD-10-CM

## 2021-11-12 DIAGNOSIS — M25551 Pain in right hip: Secondary | ICD-10-CM

## 2021-11-12 DIAGNOSIS — M545 Low back pain, unspecified: Secondary | ICD-10-CM

## 2021-11-12 IMAGING — MR MR LUMBAR SPINE W/O CM
4 of 5 series · 25 of 48 positions shown · non-contrast
Comparison: X-ray lumbar [DATE].

CLINICAL DATA: Lumbar plexopathy, nontraumatic lumbar spine pain.
Low back pain radiating to bilateral legs and right leg numbness
since [DATE].

EXAM:
MRI LUMBAR SPINE WITHOUT CONTRAST
TECHNIQUE: Multiplanar, multisequence MR imaging of the lumbar spine was
performed. No intravenous contrast was administered.

[Series 3: T2 · sagittal · 4.0mm · 1.21mm/px · 5 of 21 slices shown (1 of 2)]
[im 1/21]
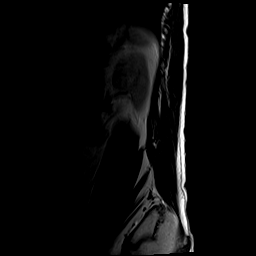
[im 6/21]
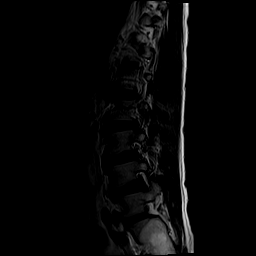
[im 11/21]
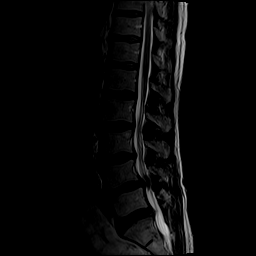
[im 16/21]
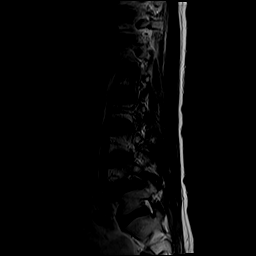
[im 21/21]
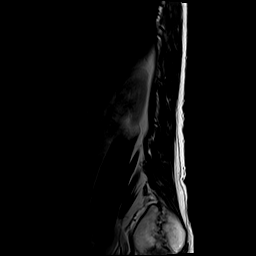

[Series 5: T1 · sagittal · 4.0mm · 1.21mm/px · 6 of 21 slices shown (1 of 2)]
[im 1/21]
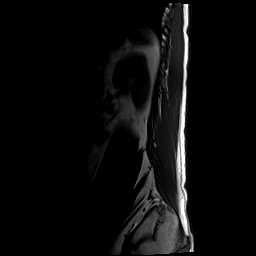
[im 5/21]
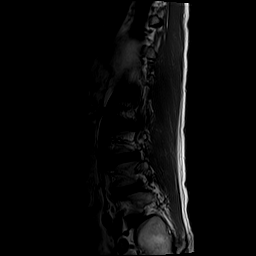
[im 9/21]
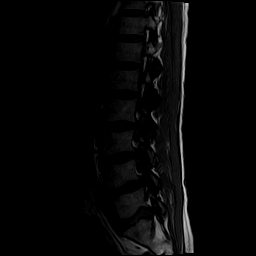
[im 13/21]
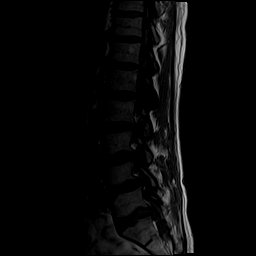
[im 17/21]
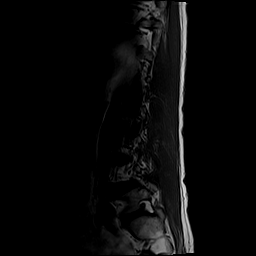
[im 21/21]
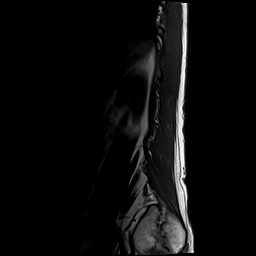

[Series 6: T2 · axial · 4.0mm · 0.41mm/px · z∈[-196,+72]mm · 10 of 59 slices shown (2 of 2)]
[im 4/59]
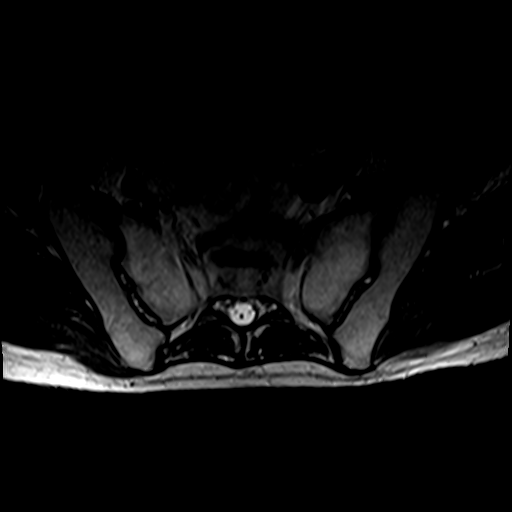
[im 8/59]
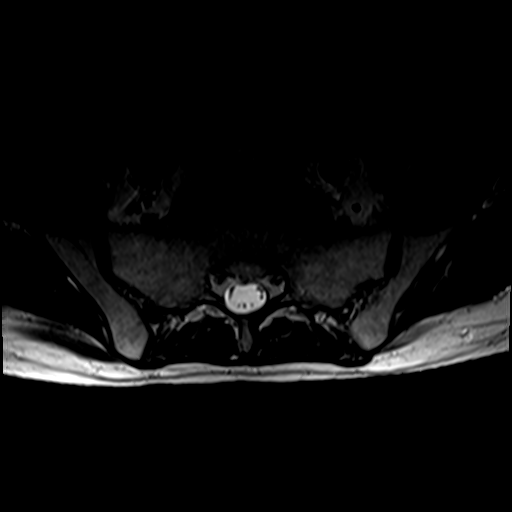
[im 12/59]
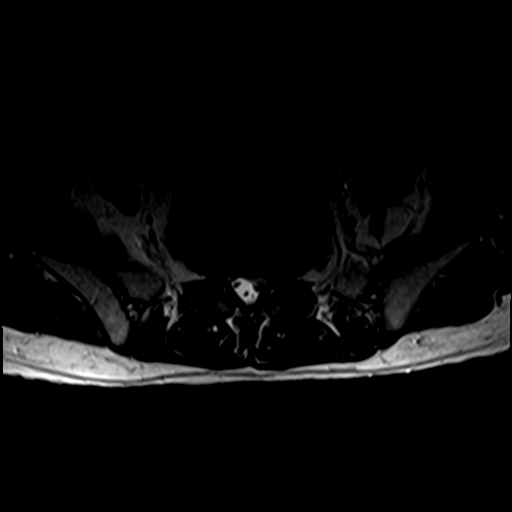
[im 20/59]
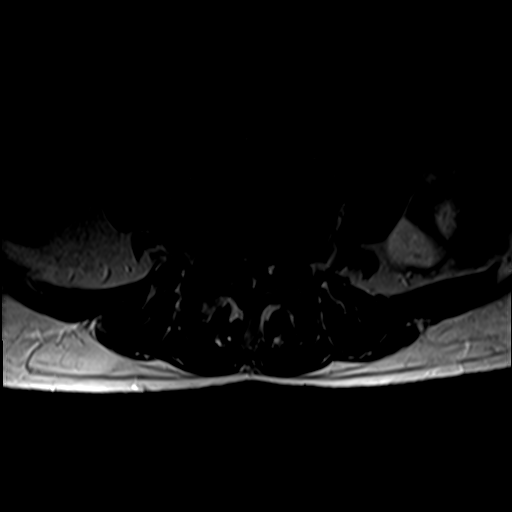
[im 28/59]
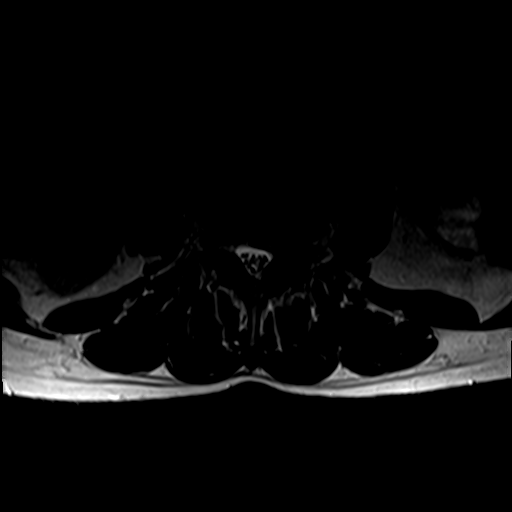
[im 31/59]
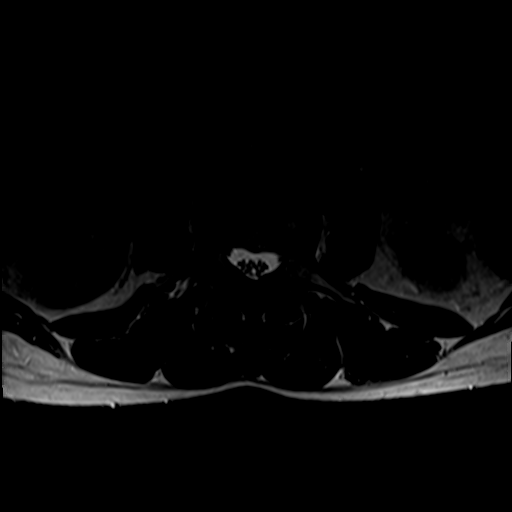
[im 35/59]
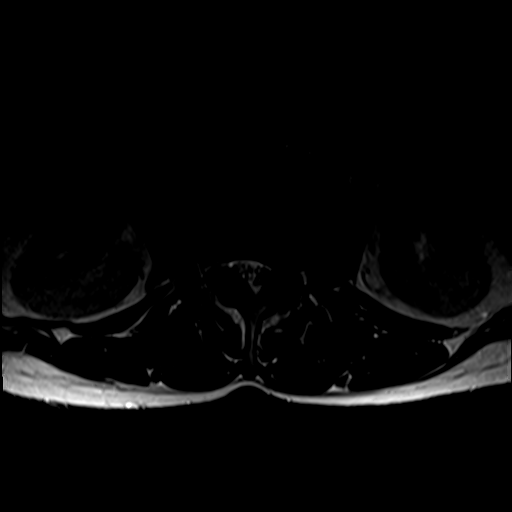
[im 43/59]
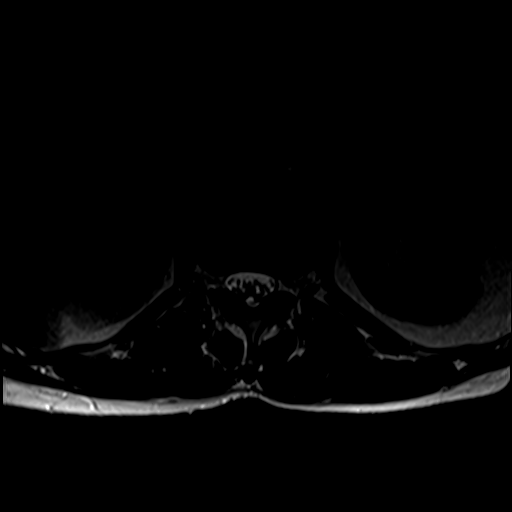
[im 51/59]
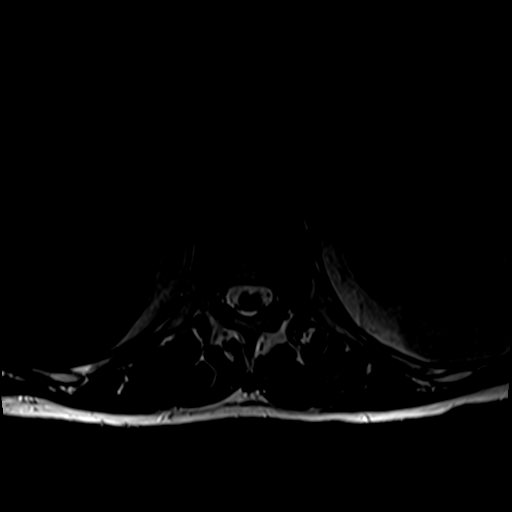
[im 59/59]
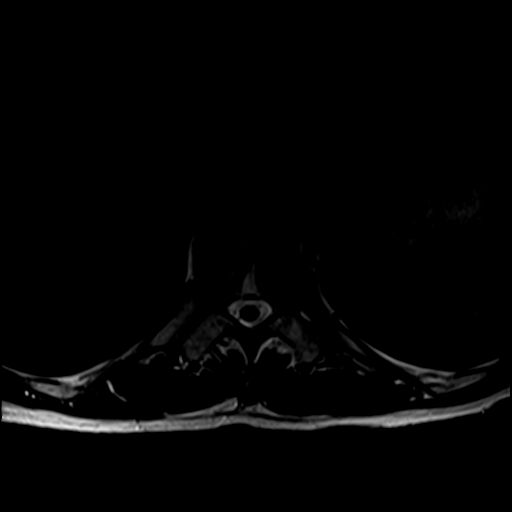

[Series 7: T1 · axial · 4.0mm · 0.41mm/px · z∈[-196,+33]mm · 4 of 59 slices shown (2 of 2)]
[im 4/59]
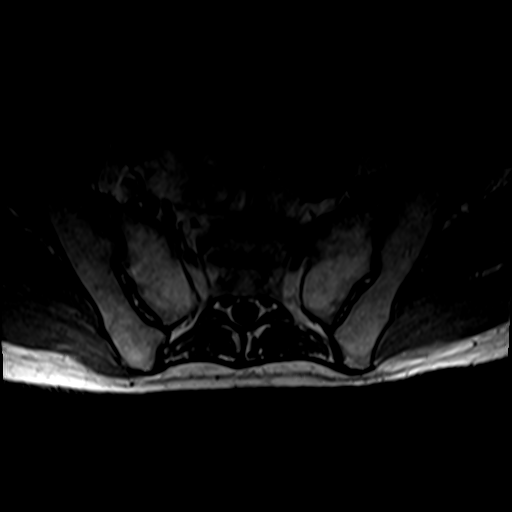
[im 8/59]
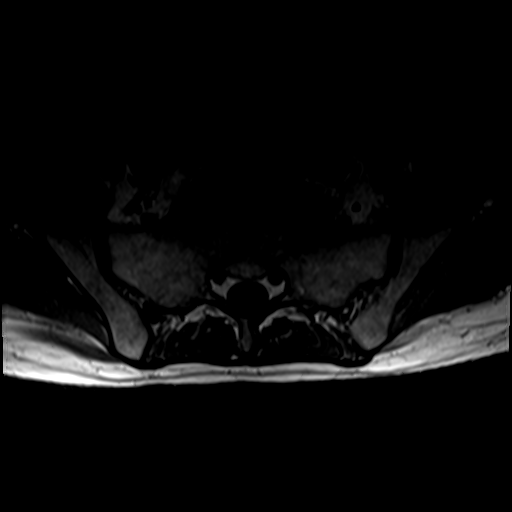
[im 31/59]
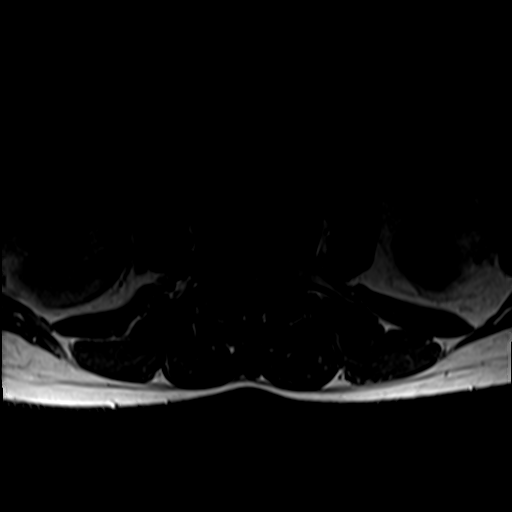
[im 51/59]
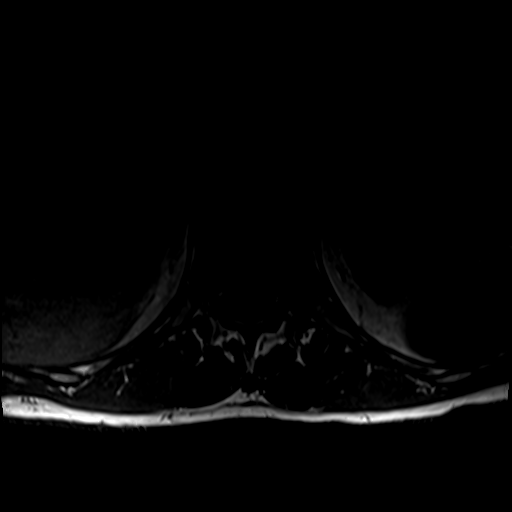

[25 of 48 positions shown; findings below may reference images not displayed]

FINDINGS: Segmentation: Standard; the lowest formed disc space is designated
L5-S1.

Alignment:  Normal.

Vertebrae: Vertebral body heights are preserved. Marrow signal is
normal. There is no suspicious marrow signal abnormality. There is
trace edema in the right L5 posterior elements, likely
reactive/degenerative. There is no other marrow edema.

Conus medullaris and cauda equina: Conus extends to the L1 level.
Conus and cauda equina appear normal.

Paraspinal and other soft tissues: A small T2 hyperintense lesion in
the left kidney likely reflects a cyst. Is mild perifacetal soft
tissue edema on the right at L4-L5. The paraspinal soft tissues are
otherwise unremarkable.

Disc levels:

There is mild disc desiccation at L3-L4 through L5-S1. Facet
arthropathy is most advanced at L4-L5.

There is congenital narrowing of the lumbar canal with superimposed
degenerative changes below.

T12-L1: No significant spinal canal or neural foraminal stenosis.

L1-L2: No significant spinal canal or neural foraminal stenosis.

L2-L3: There is a mild disc bulge and bilateral facet arthropathy
superimposed on congenital canal narrowing resulting in
mild-to-moderate spinal canal stenosis with narrowing of the
subarticular zones without significant neural foraminal stenosis

L3-L4: There is a diffuse disc bulge, ligamentum flavum thickening,
and bilateral facet arthropathy resulting in moderate spinal canal
stenosis with narrowing of the bilateral subarticular zones, and
mild bilateral neural foraminal stenosis

L4-L5: There is a diffuse disc bulge, ligamentum flavum thickening,
and moderate bilateral facet arthropathy with a 5 mm medially
projecting left synovial cyst resulting in severe spinal canal
stenosis with compression of the cauda equina nerve roots and
moderate right and mild left neural foraminal stenosis.

L5-S1: There is a diffuse disc bulge with a left subarticular zone
protrusion and annular fissure and mild bilateral facet arthropathy
resulting in impingement of the traversing left S1 nerve root. There
is no significant neural foraminal stenosis.
IMPRESSION: 1. Degenerative changes at L4-L5 including a diffuse disc bulge and
moderate bilateral facet arthropathy with a small left facet joint
synovial cyst superimposed on congenital canal narrowing resulting
in severe spinal canal stenosis with impingement of the cauda equina
nerve roots and moderate right and mild left neural foraminal
stenosis.
2. Associated mild perifacetal soft tissue edema on the right at
L4-L5 with mild reactive marrow edema in the right L5 posterior
elements, which could reflect a source of pain.
3. Left subarticular zone disc protrusion and annular fissure at
L5-S1 with impingement of the traversing left S1 nerve root.
4. Mild-to-moderate spinal canal stenosis at L2-L3 and moderate
spinal canal stenosis at L3-L4.

## 2021-11-12 IMAGING — MR MR PELVIS WO/W CM
9 series · 42 of 48 positions shown · IV contrast (multihance)
Comparison: X-ray [DATE]

CLINICAL DATA: Bilateral hip pain

EXAM:
MRI PELVIS WITHOUT AND WITH CONTRAST
TECHNIQUE: Multiplanar multisequence MR imaging of the pelvis was performed
both before and after administration of intravenous contrast.
CONTRAST:  14mL MULTIHANCE GADOBENATE DIMEGLUMINE 529 MG/ML IV SOLN

[Series 3: T2 fat-sat · sagittal · 4.0mm · 0.47mm/px · 8 of 66 slices shown (1 of 2)]
[im 1/66]
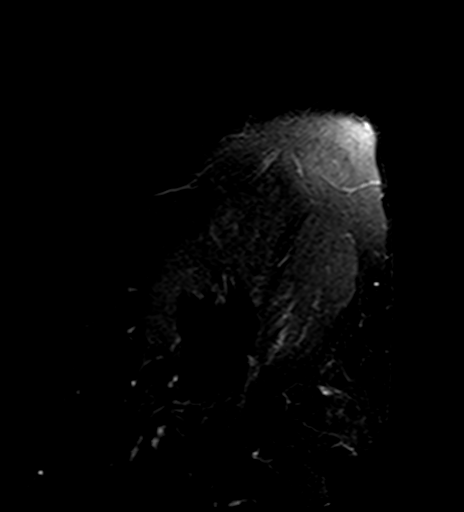
[im 10/66]
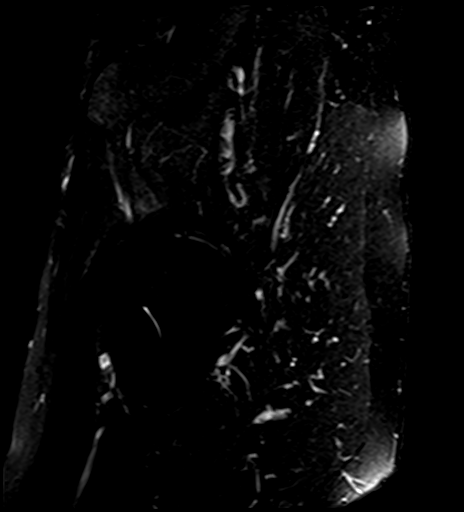
[im 19/66]
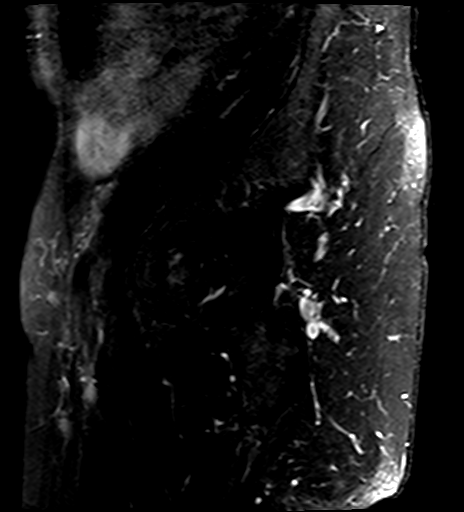
[im 28/66]
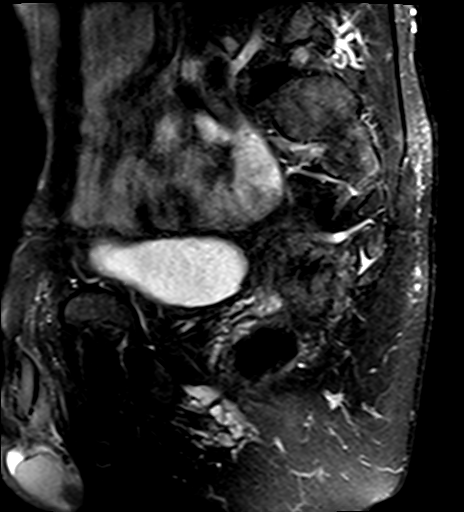
[im 38/66]
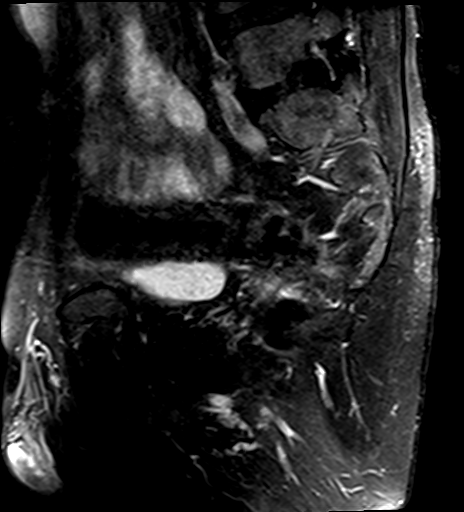
[im 47/66]
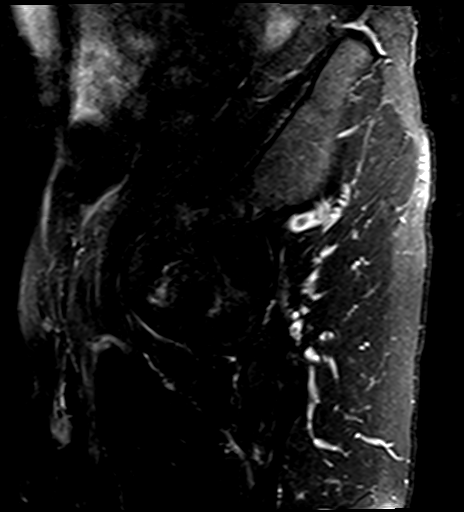
[im 56/66]
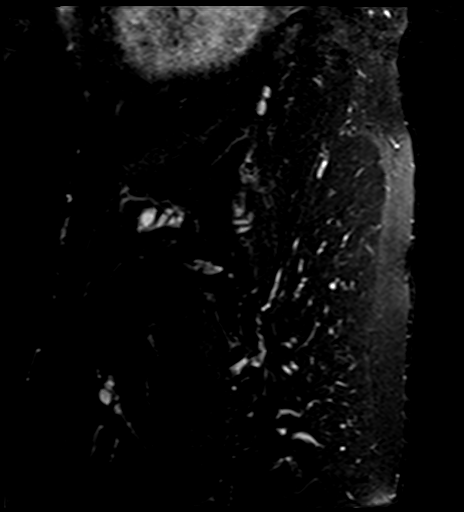
[im 66/66]
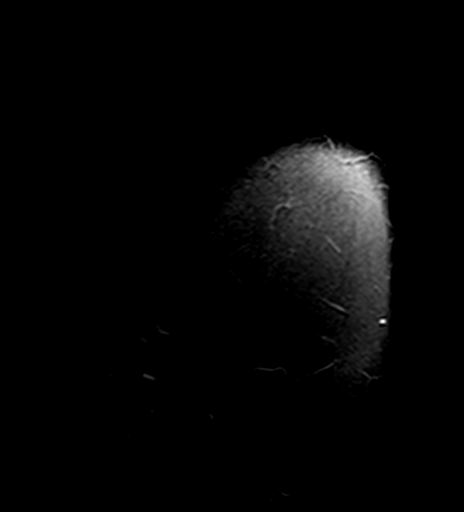

[Series 4: T1 · coronal · 4.0mm · 1.56mm/px · 4 of 36 slices shown (1 of 2)]
[im 1/36]
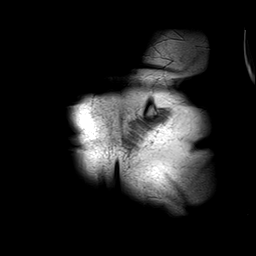
[im 12/36]
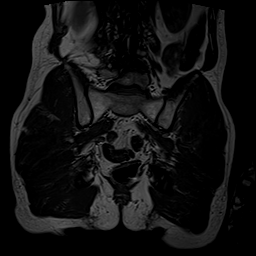
[im 24/36]
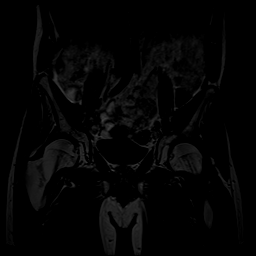
[im 36/36]
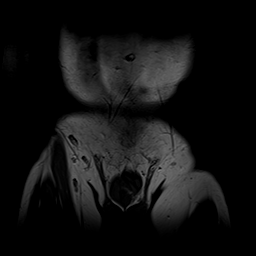

[Series 5: STIR · coronal · 4.0mm · 1.56mm/px · 4 of 36 slices shown]
[im 1/36]
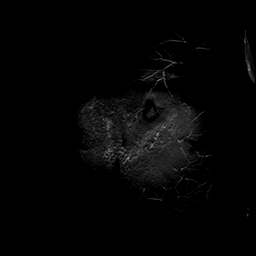
[im 12/36]
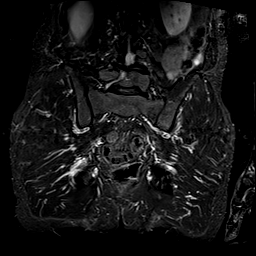
[im 24/36]
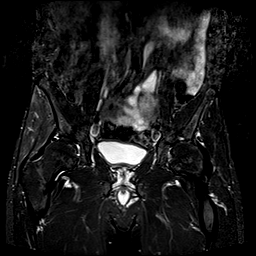
[im 36/36]
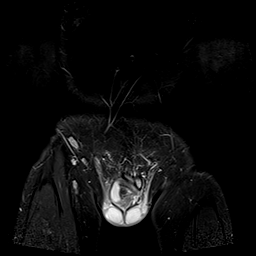

[Series 7: T2 fat-sat · axial · 4.0mm · 1.41mm/px · z∈[-135,+90]mm · 5 of 46 slices shown (2 of 2)]
[im 1/46]
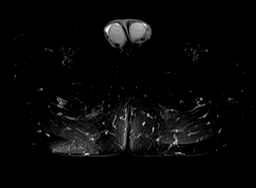
[im 12/46]
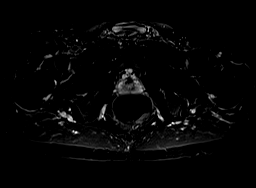
[im 23/46]
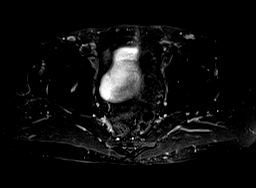
[im 34/46]
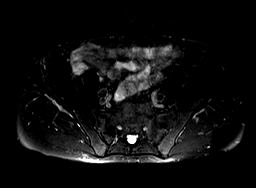
[im 46/46]
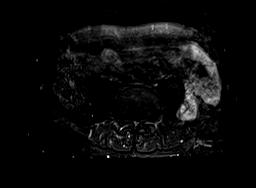

[Series 8: T1 fat-sat · axial · 4.0mm · 1.41mm/px · z∈[-135,+90]mm · 5 of 46 slices shown (1 of 4)]
[im 1/46]
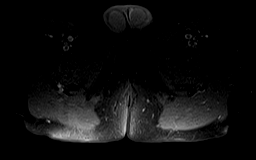
[im 12/46]
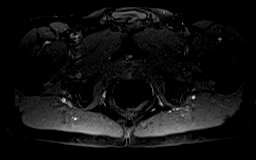
[im 23/46]
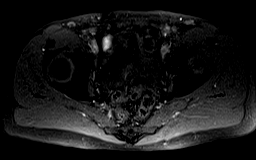
[im 34/46]
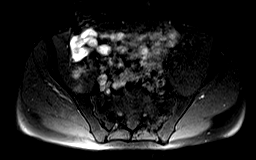
[im 46/46]
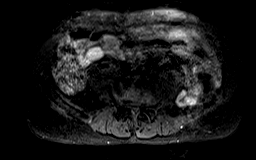

[Series 9: T1 · axial · 4.0mm · 0.70mm/px · z∈[-135,+90]mm · 5 of 46 slices shown (2 of 2)]
[im 1/46]
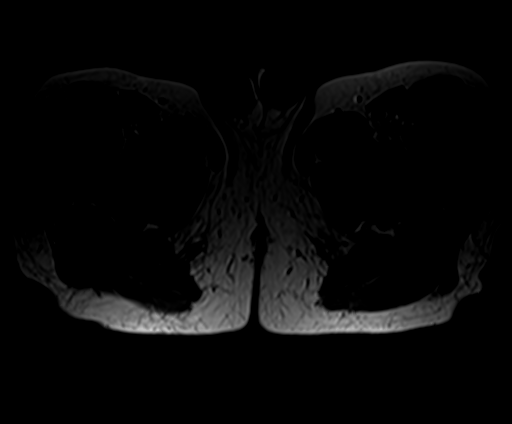
[im 12/46]
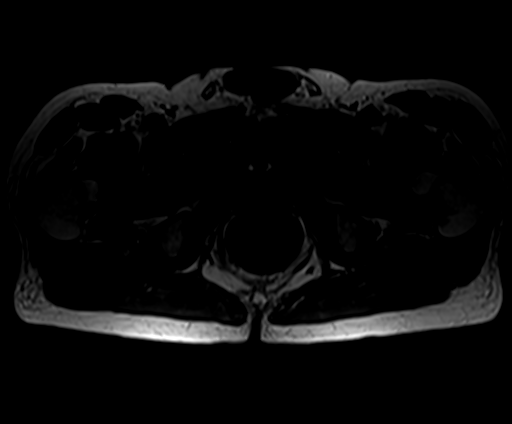
[im 23/46]
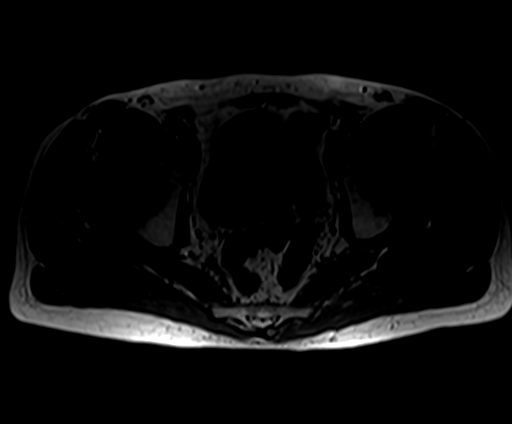
[im 34/46]
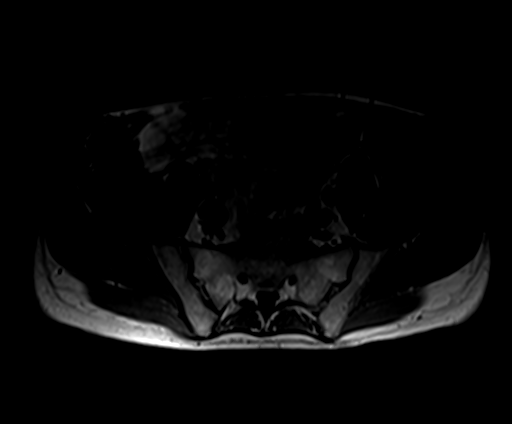
[im 46/46]
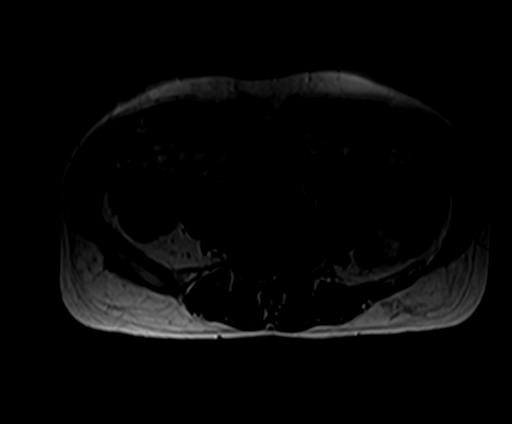

[Series 10: T1 fat-sat · axial · 4.0mm · 1.41mm/px · z∈[-135,+90]mm · 5 of 46 slices shown (2 of 4)]
[im 1/46]
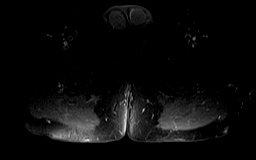
[im 12/46]
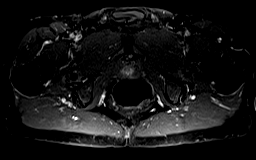
[im 23/46]
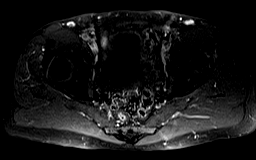
[im 34/46]
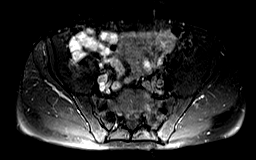
[im 46/46]
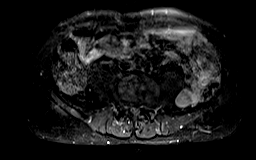

[Series 11: T1 fat-sat · coronal · 4.0mm · 1.56mm/px · 4 of 36 slices shown (3 of 4)]
[im 1/36]
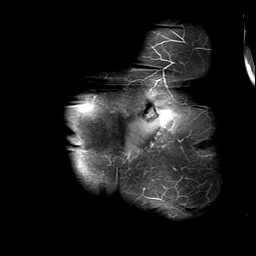
[im 12/36]
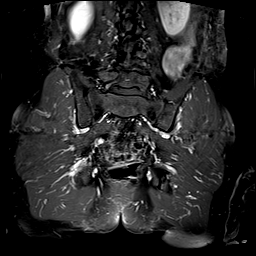
[im 24/36]
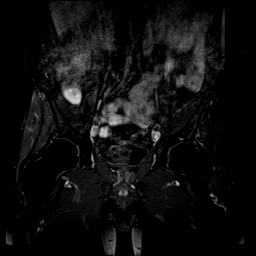
[im 36/36]
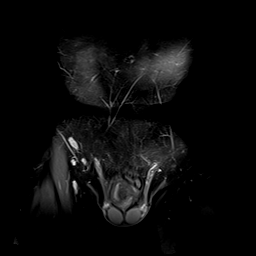

[Series 12: T1 fat-sat · sagittal · 4.0mm · 0.94mm/px · 2 of 71 slices shown (4 of 4)]
[im 1/71]
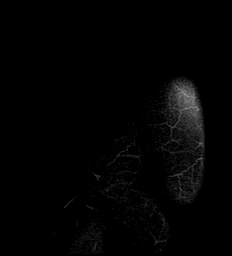
[im 11/71]
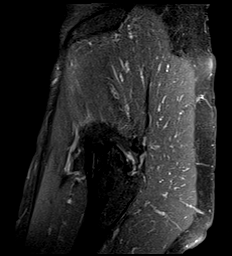

[42 of 48 positions shown; findings below may reference images not displayed]

FINDINGS: Bones/Joint/Cartilage

No acute fracture. No dislocation. No femoral head avascular
necrosis. Foci of very low T1/T2 signal within the right femoral
head and neck, may reflect small bone islands. No abnormal
postcontrast enhancement. Bony pelvis intact without diastasis.
Moderate left and mild right hip osteoarthritis. Degenerative
tearing of the superior labrum bilaterally. No hip joint effusion.
SI joints and pubic symphysis within normal limits. No bone marrow
edema. Degenerative disc and facet arthropathy of the visualized
lower lumbar spine, better characterized on concurrently obtained
MRI of lumbar spine. No marrow replacing bone lesion.

Ligaments

Intact.

Muscles and Tendons

Mild tendinosis of the bilateral hamstring tendon origins, left
worse than right. The gluteal, iliopsoas, rectus femoris, and
adductor tendons appear intact without tear or significant
tendinosis. No abnormal bursal fluid collections. Normal muscle bulk
and signal intensity without edema, atrophy, or fatty infiltration.

Soft tissues

No soft tissue edema or fluid collection. No inguinal
lymphadenopathy. Metallic susceptibility artifact within the
prostate bed, compatible with brachytherapy seeds. No acute findings
are seen within the pelvis.
IMPRESSION: 1. No acute findings within the pelvis.
2. Moderate left and mild right hip osteoarthritis.
3. Mild tendinosis of the bilateral hamstring tendon origins, left
worse than right.
4. Degenerative disc and facet arthropathy of the visualized lower
lumbar spine, better characterized on concurrently obtained MRI of
lumbar spine.

## 2021-11-12 MED ORDER — GADOBENATE DIMEGLUMINE 529 MG/ML IV SOLN
14.0000 mL | Freq: Once | INTRAVENOUS | Status: AC | PRN
  Administered 2021-11-12: 14 mL via INTRAVENOUS

## 2021-11-13 ENCOUNTER — Encounter: Payer: Self-pay | Admitting: Family Medicine

## 2021-11-13 ENCOUNTER — Other Ambulatory Visit: Payer: Self-pay

## 2021-11-13 DIAGNOSIS — H3589 Other specified retinal disorders: Secondary | ICD-10-CM | POA: Insufficient documentation

## 2021-11-13 DIAGNOSIS — M5416 Radiculopathy, lumbar region: Secondary | ICD-10-CM

## 2021-11-13 DIAGNOSIS — D3132 Benign neoplasm of left choroid: Secondary | ICD-10-CM | POA: Insufficient documentation

## 2021-11-13 DIAGNOSIS — H353122 Nonexudative age-related macular degeneration, left eye, intermediate dry stage: Secondary | ICD-10-CM | POA: Insufficient documentation

## 2021-11-14 ENCOUNTER — Ambulatory Visit
Admission: RE | Admit: 2021-11-14 | Discharge: 2021-11-14 | Disposition: A | Discharge status: Home / Self Care | Payer: Medicare Other | Source: Ambulatory Visit | Attending: Family Medicine | Admitting: Family Medicine

## 2021-11-14 DIAGNOSIS — M5416 Radiculopathy, lumbar region: Secondary | ICD-10-CM

## 2021-11-14 MED ORDER — IOPAMIDOL (ISOVUE-M 200) INJECTION 41%
1.0000 mL | Freq: Once | INTRAMUSCULAR | Status: AC
  Administered 2021-11-14: 1 mL via EPIDURAL

## 2021-11-14 MED ORDER — METHYLPREDNISOLONE ACETATE 40 MG/ML INJ SUSP (RADIOLOG
80.0000 mg | Freq: Once | INTRAMUSCULAR | Status: AC
  Administered 2021-11-14: 80 mg via EPIDURAL

## 2021-11-14 NOTE — Discharge Instructions (Signed)

## 2021-12-11 NOTE — Progress Notes (Signed)
?Charlann Boxer D.O. ?[facility] ?[place] ?Phone: [phone] ?Subjective:   ?I, Vilma Meckel, am serving as a Education administrator for Dr. Hulan Saas. ?This visit occurred during the SARS-CoV-2 public health emergency.  Safety protocols were in place, including screening questions prior to the visit, additional usage of staff PPE, and extensive cleaning of exam room while observing appropriate contact time as indicated for disinfecting solutions.  ? ?I'm seeing this patient by the request  of:  Inda Coke, [place] ? ?CC: Low back pain ? ?[id number]  ?10/16/2021 ?Chronic problem with worsening symptoms at this time.  I do concerned that patient does have significant spinal stenosis.  Seems to be worsening at this moment.  Patient has had difficulty also with daily activities at this moment.  Patient has difficulty with the process we will add a MRI of the pelvis secondary to patient having significant increase in tightness of the pelvic musculature.  Depending on findings we will discuss further medical management.  Follow-up with me after the imaging to discuss. ? ?Update 12/12/2021 ?Lansing Sigmon is a 71 y.o. male coming in with complaint of LBP. Patient states epidural worked well. Feels 80% better. No other complaints.  Patient was found to have significant osteoarthritic changes with severe spinal stenosis. ? ?  ? ?Past Medical History:  ?Diagnosis Date  ? Depression   ? Skin cancer   ? 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 ?Past Surgical History:  ?Procedure Laterality Date  ? BREAST SURGERY  02/24/1981  ? Gynecomastia; date is an estimate  ? CYSTOSCOPY WITH INSERTION OF UROLIFT  2018  ? MASTECTOMY FOR GYNECOMASTIA Left 1983  ? SKIN CANCER EXCISION  2018  ? right side of neck and left forehead  ? TONSILLECTOMY    ? ?Social History  ? ?Socioeconomic History  ? Marital status: Married  ?  Spouse name: Not on file  ? Number of children: Not on file  ? Years of  education: Not on file  ? Highest education level: Not on file  ?Occupational History  ? Not on file  ?Tobacco Use  ? Smoking status: Never  ? Smokeless tobacco: Never  ?Vaping Use  ? Vaping Use: Never used  ?Substance and Sexual Activity  ? Alcohol use: Yes  ?  Alcohol/week: 1.0 standard drink  ?  Types: 1 Cans of beer per week  ?  Comment: one beer every  week  ? Drug use: Yes  ?  Frequency: 2.0 times per week  ?  Types: Marijuana  ?  Comment: once a week  ? Sexual activity: Not Currently  ?  Comment: Vasectomy  ?Other Topics Concern  ? Not on file  ?Social History Narrative  ? Married  ? Two children, one in [place]  ? 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 ?Social Determinants of Health  ? ?Financial Resource Strain: Low Risk   ? Difficulty of Paying Living Expenses: Not hard at all  ?Food Insecurity: No Food Insecurity  ? Worried About Charity fundraiser in the Last Year: Never true  ? Ran Out of Food in the Last Year: Never true  ?Transportation Needs: No Transportation Needs  ? Lack of Transportation (Medical): No  ? Lack of Transportation (Non-Medical): No  ?Physical Activity: Insufficiently Active  ? Days of Exercise per Week: 7 days  ? Minutes of Exercise per Session: 20 min  ?Stress: No Stress Concern Present  ? Feeling of Stress :  Not at all  ?Social Connections: Moderately Integrated  ? Frequency of Communication with Friends and Family: More than three times a week  ? Frequency of Social Gatherings with Friends and Family: Once a week  ? Attends Religious Services: Never  ? Active Member of Clubs or Organizations: Yes  ? Attends Archivist Meetings: More than 4 times per year  ? Marital Status: Married  ? ?No Known Allergies ?Family History  ?Problem Relation Age of Onset  ? Alcohol abuse Mother   ? Arthritis Mother   ? COPD Mother   ? Vision loss Mother   ? Alcohol abuse Father   ? Depression Father   ? Early death Father   ? Breast cancer Sister   ? Alcohol abuse Sister   ? Depression Sister    ? Asthma Sister   ? Hyperlipidemia Sister   ? Heart attack Paternal Grandmother   ? ? ?Current Outpatient Medications (Endocrine & Metabolic):  ?  predniSONE (DELTASONE) 20 MG tablet, Take 1 tablet (20 mg total) by mouth daily with breakfast. ? ? ? ? ?Current Outpatient Medications (Hematological):  ?  ferrous sulfate 325 (65 FE) MG tablet, Take 325 mg by mouth daily in the afternoon. ? ?Current Outpatient Medications (Other):  ?  amoxicillin-clavulanate (AUGMENTIN) 875-125 MG tablet, Take 1 tablet by mouth 2 (two) times daily. ?  ARIPiprazole (ABILIFY) 5 MG tablet, Take 0.5 tablets (2.5 mg total) by mouth daily. ?  buPROPion (WELLBUTRIN XL) 300 MG 24 hr tablet, Take 1 tablet (300 mg total) by mouth daily. ?  DULoxetine (CYMBALTA) 30 MG capsule, Take 1 capsule (30 mg total) by mouth daily. ?  FIBER PO, Take 1 each by mouth in the morning and at bedtime. Tablespoon ?  finasteride (PROPECIA) 1 MG tablet, Take 1 tablet (1 mg total) by mouth daily. ?  gabapentin (NEURONTIN) 100 MG capsule, Take 2 capsules (200 mg total) by mouth at bedtime. ?  Multiple Vitamins-Minerals (PRESERVISION AREDS 2) CAPS, Take 2 capsules by mouth daily in the afternoon. ?  Omega-3 Fatty Acids (FISH OIL) 1000 MG CAPS, Take 1 capsule by mouth daily. ?  vitamin C (ASCORBIC ACID) 500 MG tablet, Take 500 mg by mouth daily. ? ? ? ?Objective  ?Blood pressure 112/64, pulse 76, height '5\' 11"'$  (1.803 m), weight 157 lb (71.2 kg), SpO2 98 %. ?  ?General: No apparent distress alert and oriented x3 mood and affect normal, dressed appropriately.  ?HEENT: Pupils equal, extraocular movements intact  ?Respiratory: Patient's speak in full sentences and does not appear short of breath  ?Cardiovascular: No lower extremity edema, non tender, no erythema  ?Gait normal with good balance and coordination.  ?MSK: Patient was able to stand up without any significant difficulty.  Patient has a negative straight leg test.  Still has some mild expected.  No warmth is  fantastic overall ?Patient does have some difficulty with extension. ?  ?Impression and Recommendations:  ?  ? ?The above documentation has been reviewed and is accurate and complete Lyndal Pulley, DO ? ? ? ?

## 2021-12-12 ENCOUNTER — Ambulatory Visit (INDEPENDENT_AMBULATORY_CARE_PROVIDER_SITE_OTHER): Payer: Medicare Other | Admitting: Family Medicine

## 2021-12-12 DIAGNOSIS — G8929 Other chronic pain: Secondary | ICD-10-CM | POA: Diagnosis not present

## 2021-12-12 DIAGNOSIS — M545 Low back pain, unspecified: Secondary | ICD-10-CM

## 2021-12-12 MED ORDER — PREDNISONE 20 MG PO TABS: 20.0000 mg | ORAL_TABLET | Freq: Every day | ORAL | 0 refills | Status: DC

## 2021-12-12 NOTE — Assessment & Plan Note (Signed)
Low back pain is secondary to severe spinal stenosis.  Patient likely has not responded extremely well to the epidural.  Patient is a relatively young male and we do need to continue to monitor.  Patient likely does not have any significant weakness.  Patient is on the Cymbalta and will see if the gabapentin makes any difference.  Otherwise consider increasing the Cymbalta.  Patient came right as it any worsening discomfort and we can repeat the epidural as well.  Follow-up with me otherwise as needed ?

## 2021-12-12 NOTE — Patient Instructions (Addendum)
Prescription filled ?Only take Prednisone and Gabapentin when needed ?Write [place] when need another injection ?See me when you need me ?

## 2022-01-06 ENCOUNTER — Ambulatory Visit (INDEPENDENT_AMBULATORY_CARE_PROVIDER_SITE_OTHER): Payer: Medicare Other | Admitting: Physician Assistant

## 2022-01-06 ENCOUNTER — Encounter: Payer: Self-pay | Admitting: Physician Assistant

## 2022-01-06 ENCOUNTER — Other Ambulatory Visit: Payer: Self-pay | Admitting: *Deleted

## 2022-01-06 VITALS — BP 110/68 | HR 67 | Temp 98.3°F | Ht 71.0 in | Wt 157.2 lb

## 2022-01-06 DIAGNOSIS — L659 Nonscarring hair loss, unspecified: Secondary | ICD-10-CM

## 2022-01-06 DIAGNOSIS — F329 Major depressive disorder, single episode, unspecified: Secondary | ICD-10-CM | POA: Diagnosis not present

## 2022-01-06 DIAGNOSIS — R790 Abnormal level of blood mineral: Secondary | ICD-10-CM | POA: Diagnosis not present

## 2022-01-06 MED ORDER — ARIPIPRAZOLE 5 MG PO TABS: 2.5000 mg | ORAL_TABLET | Freq: Every day | ORAL | 1 refills | Status: DC

## 2022-01-06 MED ORDER — FINASTERIDE 1 MG PO TABS: 1.0000 mg | ORAL_TABLET | Freq: Every day | ORAL | 3 refills | Status: DC

## 2022-01-06 MED ORDER — DULOXETINE HCL 30 MG PO CPEP: 30.0000 mg | ORAL_CAPSULE | Freq: Every day | ORAL | 1 refills | Status: DC

## 2022-01-06 MED ORDER — BUPROPION HCL ER (XL) 300 MG PO TB24: 300.0000 mg | ORAL_TABLET | Freq: Every day | ORAL | 1 refills | Status: DC

## 2022-01-06 NOTE — Patient Instructions (Signed)
It was great to see you! ? ?We will update blood work today and provide recommendations accordingly. ? ?Follow-up based on results. ? ?Take care, ? ?Inda Coke PA-C  ?

## 2022-01-06 NOTE — Progress Notes (Signed)
Andrew Benitez is a 71 y.o. male here for a follow up of anemia. ? ?History of Present Illness:  ? ?Chief Complaint  ?Patient presents with  ? Anemia  ? ? ?HPI ? ?Low Ferritin ?As directed, Andrew Benitez has been taking his ferrous sulfate 325 mg daily with no complications. States he tries to take this pill with orange juice prior to eating breakfast. At this time he is interested in re-checking this today to see if this supplement has improved levels. Although he was once reluctant, he is now in agreement to visit a hematologist if deemed necessary.  Denies concerning sx. He has continued to hold off on blood donations. ? ?Hair Loss  ?Since our previous visit, pt has been compliant with taking propecia 1 mg daily with no complications. States that his wife has noticed some hair growth toward his hairline but he hasn't noticed this. Despite this he is managing well with the medication and would like to continue this.  ? ?Depression ?Andrew Benitez is currently compliant with taking cymbalta 30 mg daily, Wellbutrin XL 300 mg daily, and Abilify 2.5 mg daily with no adverse effects. Pt expresses that this medication has been beneficial to him. At this time he is tolerating well. Denies SI/HI.  ? ?Past Medical History:  ?Diagnosis Date  ? Depression   ? Skin cancer   ? 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 ?  ?Social History  ? ?Tobacco Use  ? Smoking status: Never  ? Smokeless tobacco: Never  ?Vaping Use  ? Vaping Use: Never used  ?Substance Use Topics  ? Alcohol use: Yes  ?  Alcohol/week: 1.0 standard drink  ?  Types: 1 Cans of beer per week  ?  Comment: one beer every  week  ? Drug use: Yes  ?  Frequency: 2.0 times per week  ?  Types: Marijuana  ?  Comment: once a week  ? ? ?Past Surgical History:  ?Procedure Laterality Date  ? BREAST SURGERY  02/24/1981  ? Gynecomastia; date is an estimate  ? CYSTOSCOPY WITH INSERTION OF UROLIFT  2018  ? MASTECTOMY FOR GYNECOMASTIA Left 1983  ? SKIN CANCER EXCISION  2018  ?  right side of neck and left forehead  ? TONSILLECTOMY    ? ? ?Family History  ?Problem Relation Age of Onset  ? Alcohol abuse Mother   ? Arthritis Mother   ? COPD Mother   ? Vision loss Mother   ? Alcohol abuse Father   ? Depression Father   ? Early death Father   ? Breast cancer Sister   ? Alcohol abuse Sister   ? Depression Sister   ? Asthma Sister   ? Hyperlipidemia Sister   ? Heart attack Paternal Grandmother   ? ? ?No Known Allergies ? ?Current Medications:  ? ?Current Outpatient Medications:  ?  ferrous sulfate 325 (65 FE) MG tablet, Take 325 mg by mouth daily in the afternoon., Disp: , Rfl:  ?  FIBER PO, Take 1 each by mouth in the morning and at bedtime. Tablespoon, Disp: , Rfl:  ?  Multiple Vitamins-Minerals (PRESERVISION AREDS 2) CAPS, Take 2 capsules by mouth daily in the afternoon., Disp: , Rfl:  ?  Omega-3 Fatty Acids (FISH OIL) 1000 MG CAPS, Take 1 capsule by mouth daily., Disp: , Rfl:  ?  vitamin C (ASCORBIC ACID) 500 MG tablet, Take 500 mg by mouth daily., Disp: , Rfl:  ?  ARIPiprazole (ABILIFY) 5 MG tablet,  Take 0.5 tablets (2.5 mg total) by mouth daily., Disp: 45 tablet, Rfl: 1 ?  buPROPion (WELLBUTRIN XL) 300 MG 24 hr tablet, Take 1 tablet (300 mg total) by mouth daily., Disp: 90 tablet, Rfl: 1 ?  DULoxetine (CYMBALTA) 30 MG capsule, Take 1 capsule (30 mg total) by mouth daily., Disp: 90 capsule, Rfl: 1 ?  finasteride (PROPECIA) 1 MG tablet, Take 1 tablet (1 mg total) by mouth daily., Disp: 90 tablet, Rfl: 3  ? ?Review of Systems:  ? ?ROS ?Negative unless otherwise specified per HPI. ?Vitals:  ? ?Vitals:  ? 01/06/22 1352  ?BP: 110/68  ?Pulse: 67  ?Temp: 98.3 ?F (36.8 ?C)  ?TempSrc: Temporal  ?SpO2: 98%  ?Weight: 157 lb 4 oz (71.3 kg)  ?Height: '5\' 11"'$  (1.803 m)  ?   ?Body mass index is 21.93 kg/m?. ? ?Physical Exam:  ? ?Physical Exam ?Vitals and nursing note reviewed.  ?Constitutional:   ?   General: He is not in acute distress. ?   Appearance: He is well-developed. He is not ill-appearing or  toxic-appearing.  ?Cardiovascular:  ?   Rate and Rhythm: Normal rate and regular rhythm.  ?   Pulses: Normal pulses.  ?   Heart sounds: Normal heart sounds, S1 normal and S2 normal.  ?Pulmonary:  ?   Effort: Pulmonary effort is normal.  ?   Breath sounds: Normal breath sounds.  ?Skin: ?   General: Skin is warm and dry.  ?Neurological:  ?   Mental Status: He is alert.  ?   GCS: GCS eye subscore is 4. GCS verbal subscore is 5. GCS motor subscore is 6.  ?Psychiatric:     ?   Speech: Speech normal.     ?   Behavior: Behavior normal. Behavior is cooperative.  ? ? ?Assessment and Plan:  ? ?Major depressive disorder, remission status unspecified, unspecified whether recurrent ?Controlled ?No red flags;Denies SI/HI ?Continue Abilify 2.5 mg daily, Wellbutrin XL 300 mg daily, and Cymbalta 30 mg daily  ?I advised patient that if they develop any SI, to tell someone immediately and seek medical attention ?Follow up as needed ? ?Hair loss ?Stable ?Continue propecia 1 mg daily  ?Follow up as needed  ? ?Low ferritin ?Update labs, will refer to hematology as indicated by results ?Continue ferrous sulfate 325 mg daily  ? ?I,Havlyn C Ratchford,acting as a scribe for [employer], PA.,have documented all relevant documentation on the behalf of Inda Coke, PA,as directed by  Inda Coke, PA while in the presence of Inda Coke, [place]. ? ?IInda Coke, PA, have reviewed all documentation for this visit. The documentation on 01/06/22 for the exam, diagnosis, procedures, and orders are all accurate and complete. ? ? ?Inda Coke, PA-C ? ?

## 2022-01-07 ENCOUNTER — Encounter: Payer: Self-pay | Admitting: Physician Assistant

## 2022-01-07 LAB — CBC WITH DIFFERENTIAL/PLATELET
Basophils Absolute: 0.1 10*3/uL (ref 0.0–0.1)
Basophils Relative: 1.2 % (ref 0.0–3.0)
Eosinophils Absolute: 0.1 10*3/uL (ref 0.0–0.7)
Eosinophils Relative: 1 % (ref 0.0–5.0)
HCT: 40.9 % (ref 39.0–52.0)
Hemoglobin: 13.5 g/dL (ref 13.0–17.0)
Lymphocytes Relative: 44.1 % (ref 12.0–46.0)
Lymphs Abs: 3.1 10*3/uL (ref 0.7–4.0)
MCHC: 32.9 g/dL (ref 30.0–36.0)
MCV: 87 fl (ref 78.0–100.0)
Monocytes Absolute: 0.9 10*3/uL (ref 0.1–1.0)
Monocytes Relative: 13 % — ABNORMAL HIGH (ref 3.0–12.0)
Neutro Abs: 2.9 10*3/uL (ref 1.4–7.7)
Neutrophils Relative %: 40.7 % — ABNORMAL LOW (ref 43.0–77.0)
Platelets: 210 10*3/uL (ref 150.0–400.0)
RBC: 4.7 Mil/uL (ref 4.22–5.81)
RDW: 13.9 % (ref 11.5–15.5)
WBC: 7.1 10*3/uL (ref 4.0–10.5)

## 2022-01-07 LAB — IBC + FERRITIN
Ferritin: 29 ng/mL (ref 22.0–322.0)
Iron: 119 ug/dL (ref 42–165)
Saturation Ratios: 31 % (ref 20.0–50.0)
TIBC: 383.6 ug/dL (ref 250.0–450.0)
Transferrin: 274 mg/dL (ref 212.0–360.0)

## 2022-01-13 ENCOUNTER — Encounter: Payer: Self-pay | Admitting: Family Medicine

## 2022-01-13 ENCOUNTER — Encounter: Payer: Self-pay | Admitting: Physician Assistant

## 2022-01-13 DIAGNOSIS — M5416 Radiculopathy, lumbar region: Secondary | ICD-10-CM

## 2022-01-15 ENCOUNTER — Telehealth: Payer: Self-pay

## 2022-01-15 NOTE — Telephone Encounter (Signed)
Spoke to pt told him discussed with [name] and she said on Good Rx at [facility] Finasteride is only $5.88 for 30 day supply. Pt verbalized understanding and will use coupon. ? ? ?

## 2022-01-15 NOTE — Telephone Encounter (Signed)
Spoke to pt told him Rx was sent to [facility], you may have to call and let them know you want to pay out of pocket. Pt said he did it was going to be over $200.00 and he would like [place] to do a Prior Auth to see if insurance will cover. Told him okay will submit Prior Auth as soon as I hear back will let you know. Pt verbalized understanding. ?

## 2022-01-15 NOTE — Telephone Encounter (Signed)
Mr. Bursch called stating that the refill for his Finasteride was declined. Pharmacy stating it is not covered by insurance. He would like to continue this medication. Can we make sure that he did not need authorization as [employer] covered it last time.  Thanks ?

## 2022-01-24 ENCOUNTER — Ambulatory Visit
Admission: RE | Admit: 2022-01-24 | Discharge: 2022-01-24 | Disposition: A | Discharge status: Home / Self Care | Payer: Medicare Other | Source: Ambulatory Visit | Attending: Family Medicine | Admitting: Family Medicine

## 2022-01-24 DIAGNOSIS — M5416 Radiculopathy, lumbar region: Secondary | ICD-10-CM

## 2022-01-24 MED ORDER — METHYLPREDNISOLONE ACETATE 40 MG/ML INJ SUSP (RADIOLOG
80.0000 mg | Freq: Once | INTRAMUSCULAR | Status: AC
  Administered 2022-01-24: 80 mg via EPIDURAL

## 2022-01-24 MED ORDER — IOPAMIDOL (ISOVUE-M 200) INJECTION 41%
1.0000 mL | Freq: Once | INTRAMUSCULAR | Status: AC
  Administered 2022-01-24: 1 mL via EPIDURAL

## 2022-01-24 NOTE — Discharge Instructions (Signed)

## 2022-02-14 ENCOUNTER — Ambulatory Visit (INDEPENDENT_AMBULATORY_CARE_PROVIDER_SITE_OTHER): Payer: Medicare Other | Admitting: Physician Assistant

## 2022-02-14 ENCOUNTER — Encounter: Payer: Self-pay | Admitting: Physician Assistant

## 2022-02-14 VITALS — BP 118/64 | HR 66 | Temp 97.9°F | Ht 71.0 in | Wt 154.0 lb

## 2022-02-14 DIAGNOSIS — M25531 Pain in right wrist: Secondary | ICD-10-CM | POA: Diagnosis not present

## 2022-02-14 DIAGNOSIS — M79601 Pain in right arm: Secondary | ICD-10-CM

## 2022-02-14 NOTE — Progress Notes (Signed)
Andrew Benitez is a 71 y.o. male here for a new problem.  History of Present Illness:   Chief Complaint  Patient presents with   Wrist Pain    Pt is c/o pain right extremity, numbness in hand. Started Monday morning. He has been using Advil.    HPI  R arm and R wrist pain May 23rd bought a new guitar. He has been playing for about an hour a day since he bought this. He has upper R arm pain where his arm sits on his guitar. He is also having some R wrist pain and numbness in his hand at times. Last played his guitar last evening. Has tried 400 mg ibuprofen BID without relief.   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)     Social History   Tobacco Use   Smoking status: Never   Smokeless tobacco: Never  Vaping Use   Vaping Use: Never used  Substance Use Topics   Alcohol use: Yes    Alcohol/week: 1.0 standard drink    Types: 1 Cans of beer per week    Comment: one beer every  week   Drug use: Yes    Frequency: 2.0 times per week    Types: Marijuana    Comment: once a week    Past Surgical History:  Procedure Laterality Date   BREAST SURGERY  02/24/1981   Gynecomastia; date is an estimate   CYSTOSCOPY WITH INSERTION OF UROLIFT  2018   MASTECTOMY FOR GYNECOMASTIA Left 1983   SKIN CANCER EXCISION  2018   right side of neck and left forehead   TONSILLECTOMY      Family History  Problem Relation Age of Onset   Alcohol abuse Mother    Arthritis Mother    COPD Mother    Vision loss Mother    Alcohol abuse Father    Depression Father    Early death Father    Breast cancer Sister    Alcohol abuse Sister    Depression Sister    Asthma Sister    Hyperlipidemia Sister    Heart attack Paternal Grandmother     No Known Allergies  Current Medications:   Current Outpatient Medications:    ARIPiprazole (ABILIFY) 5 MG tablet, Take 0.5 tablets (2.5 mg total) by mouth daily., Disp: 45  tablet, Rfl: 1   buPROPion (WELLBUTRIN XL) 300 MG 24 hr tablet, Take 1 tablet (300 mg total) by mouth daily., Disp: 90 tablet, Rfl: 1   DULoxetine (CYMBALTA) 30 MG capsule, Take 1 capsule (30 mg total) by mouth daily., Disp: 90 capsule, Rfl: 1   ferrous sulfate 325 (65 FE) MG tablet, Take 325 mg by mouth every other day., Disp: , Rfl:    FIBER PO, Take 1 each by mouth in the morning and at bedtime. Tablespoon, Disp: , Rfl:    finasteride (PROPECIA) 1 MG tablet, Take 1 tablet (1 mg total) by mouth daily., Disp: 90 tablet, Rfl: 3   Multiple Vitamins-Minerals (PRESERVISION AREDS 2) CAPS, Take 2 capsules by mouth daily in the afternoon., Disp: , Rfl:    Omega-3 Fatty Acids (FISH OIL) 1000 MG CAPS, Take 1 capsule by mouth daily., Disp: , Rfl:    vitamin C (ASCORBIC ACID) 500 MG tablet, Take 500 mg by mouth daily., Disp: , Rfl:    Review of Systems:   ROS Negative unless otherwise specified  per HPI.  Vitals:   Vitals:   02/14/22 1010  BP: 118/64  Pulse: 66  Temp: 97.9 F (36.6 C)  TempSrc: Temporal  SpO2: 98%  Weight: 154 lb (69.9 kg)  Height: '5\' 11"'$  (1.803 m)     Body mass index is 21.48 kg/m.  Physical Exam:   Physical Exam Vitals and nursing note reviewed.  Constitutional:      Appearance: He is well-developed.  HENT:     Head: Normocephalic.  Eyes:     Conjunctiva/sclera: Conjunctivae normal.     Pupils: Pupils are equal, round, and reactive to light.  Pulmonary:     Effort: Pulmonary effort is normal.  Musculoskeletal:        General: Normal range of motion.     Cervical back: Normal range of motion.     Comments: R inner upper arm with slight TTP no visible deformity or abnormality  No R wrist TTP or abnormal ROM  Skin:    General: Skin is warm and dry.  Neurological:     Mental Status: He is alert and oriented to person, place, and time.  Psychiatric:        Behavior: Behavior normal.        Thought Content: Thought content normal.        Judgment: Judgment  normal.    Assessment and Plan:   Right arm pain Suspect from compression of his arm on guitar No red flags Recommend taking hiatus from the guitar If no improvement in two weeks, needs follow-up If worsening, needs follow-up  Right wrist pain Suspect overuse injury No red flags Provided ace bandage for compression and topical voltaren to see if this helps Recommend taking hiatus from the guitar If no improvement in two weeks, needs follow-up If worsening, needs follow-up  Inda Coke, PA-C

## 2022-02-15 ENCOUNTER — Encounter: Payer: Self-pay | Admitting: Physician Assistant

## 2022-02-15 DIAGNOSIS — M25531 Pain in right wrist: Secondary | ICD-10-CM

## 2022-02-15 DIAGNOSIS — M79601 Pain in right arm: Secondary | ICD-10-CM

## 2022-02-24 ENCOUNTER — Ambulatory Visit (INDEPENDENT_AMBULATORY_CARE_PROVIDER_SITE_OTHER): Payer: Medicare Other

## 2022-02-24 ENCOUNTER — Ambulatory Visit (INDEPENDENT_AMBULATORY_CARE_PROVIDER_SITE_OTHER): Payer: Medicare Other | Admitting: Family Medicine

## 2022-02-24 ENCOUNTER — Ambulatory Visit: Payer: Medicare Other | Admitting: Family Medicine

## 2022-02-24 ENCOUNTER — Encounter: Payer: Self-pay | Admitting: Family Medicine

## 2022-02-24 VITALS — BP 100/56 | HR 69 | Ht 71.0 in | Wt 155.6 lb

## 2022-02-24 DIAGNOSIS — M25531 Pain in right wrist: Secondary | ICD-10-CM

## 2022-02-24 DIAGNOSIS — R202 Paresthesia of skin: Secondary | ICD-10-CM | POA: Diagnosis not present

## 2022-02-24 DIAGNOSIS — M79631 Pain in right forearm: Secondary | ICD-10-CM

## 2022-02-24 DIAGNOSIS — G5621 Lesion of ulnar nerve, right upper limb: Secondary | ICD-10-CM

## 2022-02-24 IMAGING — DX DG CERVICAL SPINE 2 OR 3 VIEWS
3 series · 3 of 3 positions shown · non-contrast
Comparison: None Available.

CLINICAL DATA: Right arm pain.

EXAM:
CERVICAL SPINE - 2-3 VIEW

[c-spine lat]
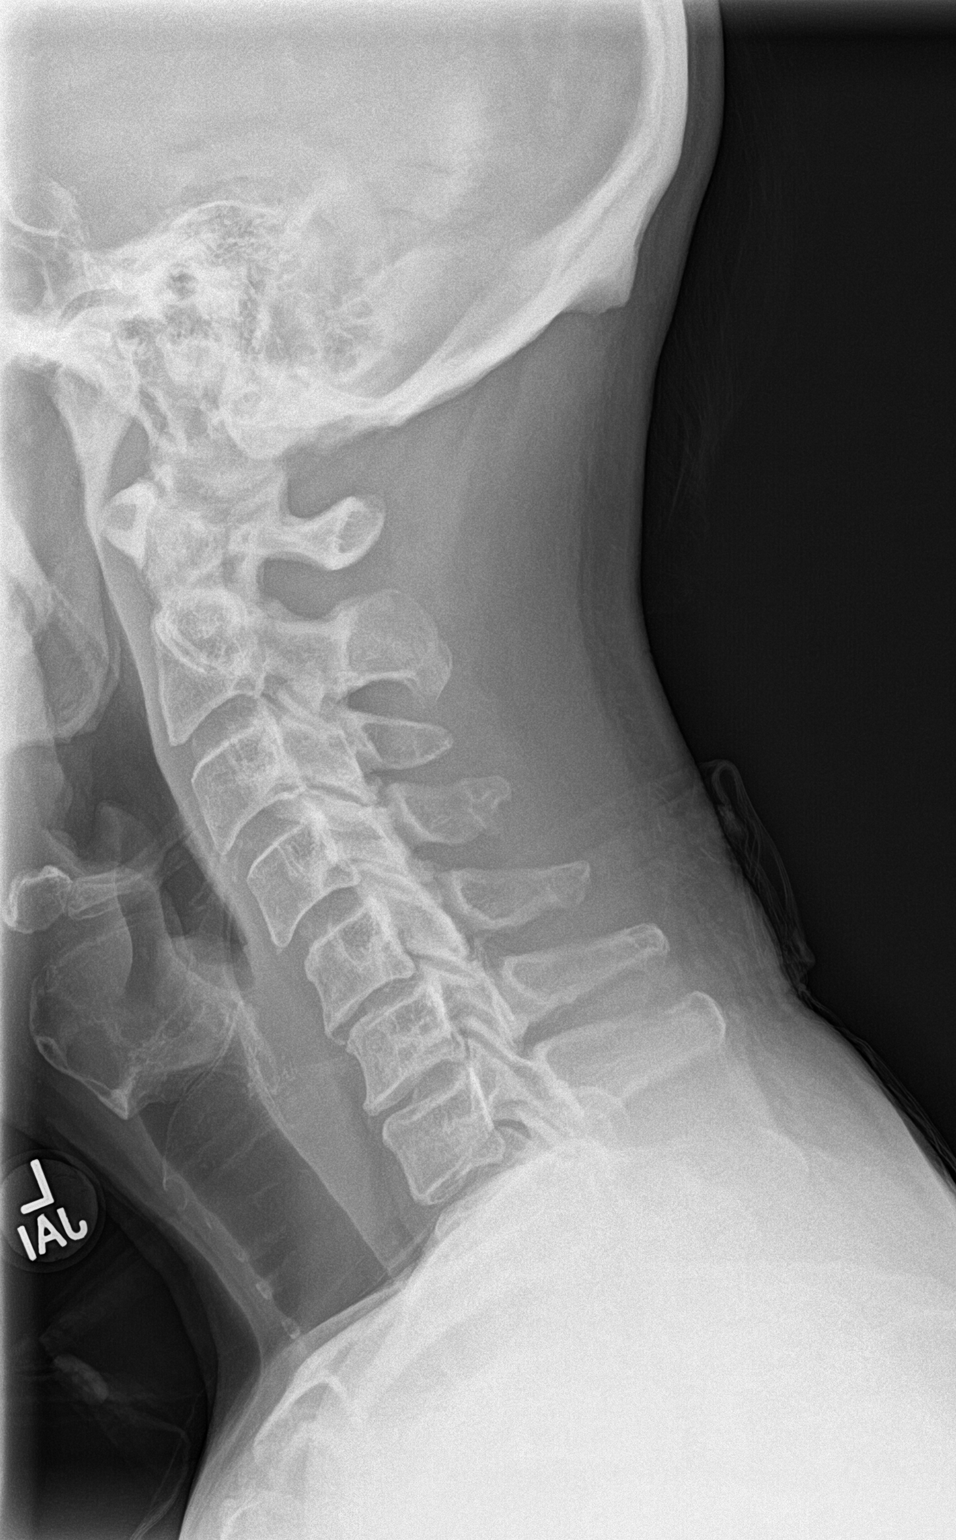

[c-spine ap]
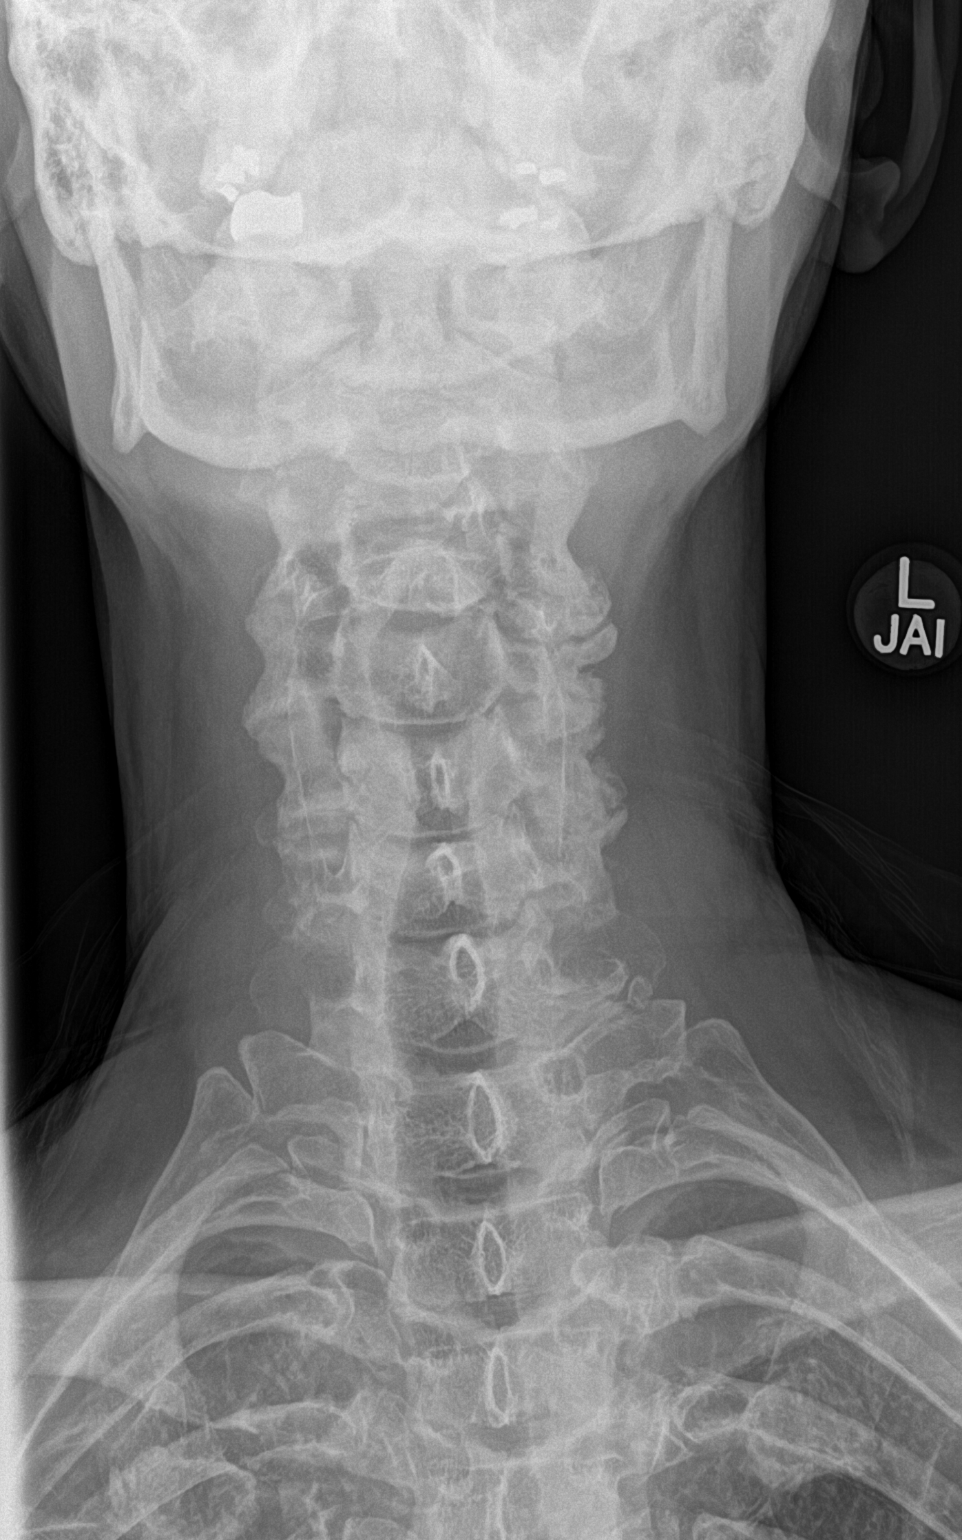

[c-spine open mouth]
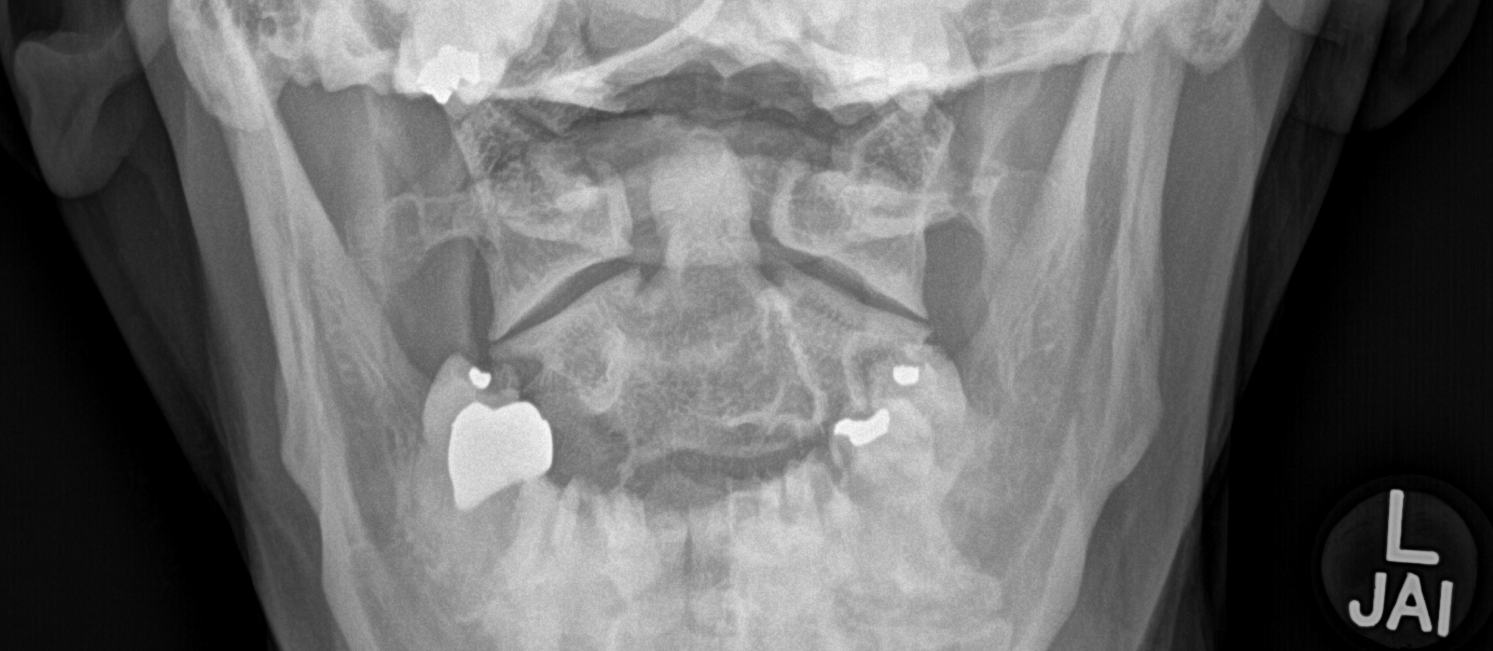

[3 of 3 positions shown; findings below may reference images not displayed]

FINDINGS: There is straightening of normal cervical lordosis. There is no
evidence of cervical spine fracture or prevertebral soft tissue
swelling. Alignment is normal. Mild degenerative endplate changes
are seen at C5-C6.
IMPRESSION: 1. No evidence for fracture or malalignment.
2. Mild degenerative changes.

## 2022-02-24 NOTE — Patient Instructions (Addendum)
Nice to see you today.  Cubital tunnel elbow brace.  Please get an Xray today before you leave.  Follow-up: one month

## 2022-02-24 NOTE — Progress Notes (Signed)
I, Andrew Benitez, LAT, ATC, am serving as scribe for Dr. Lynne Leader.  Andrew Benitez is a 71 y.o. male who presents to [place] [facility] today for R wrist, forearm and hand pain since 02/04/22 that began after buying a new guitar and playing about an hour/day since getting it. He was seen by his PCP for this condition on 02/14/22.  He was last seen by Dr. Tamala Julian on 12/12/21 for chronic LBP.  Today, he locates his pain to his R ant forearm,  entire wrist and hand w/ paresthesias into his R 4th and 5th fingers.  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   Exam:  BP (!) 100/56 (BP Location: Right Arm, Patient Position: Sitting, Cuff Size: Normal)   Pulse 69   Ht '5\' 11"'$  (1.803 m)   Wt 155 lb 9.6 oz (70.6 kg)   SpO2 97%   BMI 21.70 kg/m  General: Well Developed, well nourished, and in no acute distress.   MSK: C-spine: Normal appearing Nontender to midline. Normal cervical motion. Negative Spurling's test. Upper extremity strength and reflexes are intact.  Right elbow: Normal. Normal motion. Positive Tinel's at cubital tunnel.  Right wrist and hand: Normal-appearing Nontender. Normal motion and strength. Negative Tinel's at carpal tunnel.    Lab and Radiology Results  X-ray images C-spine obtained today personally and independently interpreted Loss of cervical lordosis is present. 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 male with right arm paresthesias at the ulnar hand with some pain.  This occurred after switching from an acoustic guitar to an Art gallery manager.  I think the change in positioning is causing more elbow flexion and prompting or promoting cubital tunnel syndrome.  Cervical radiculopathy at C6 is also a possibility on the right.  Plan for cervical spine x-ray.  For treatment he will work on Personal assistant with his Biochemist, clinical.  Since he was able to tolerate the acoustic attire positioning more easily I suggested that he use a small throw pillow between his belly and the electric attire to push the guitar positioning away from him a little bit replicated the positioning of an acoustic guitar.  Additionally will use a cubital tunnel brace at night.  We discussed about prednisone and gabapentin.  He will take his remaining low-dose gabapentin occasionally and hold off on prednisone for now.  Recheck in 1 month.   PDMP not reviewed this encounter. Orders Placed This Encounter  Procedures   DG Cervical Spine 2 or 3 views    Standing Status:   Future    Number of Occurrences:   1    Standing Expiration Date:   03/26/2022    Order Specific Question:   Reason for Exam (SYMPTOM  OR DIAGNOSIS REQUIRED)    Answer:   R arm pain    Order Specific Question:   Preferred imaging location?    Answer:   [facility]   No orders of the  defined types were placed in this encounter.    Discussed warning signs or symptoms. Please see discharge instructions. Patient expresses understanding.   The above documentation has been reviewed and is accurate and complete Lynne Leader, M.D.

## 2022-02-25 NOTE — Progress Notes (Signed)
Cervical spine x-ray shows some mild arthritis changes.

## 2022-03-05 ENCOUNTER — Other Ambulatory Visit: Payer: Self-pay | Admitting: Physician Assistant

## 2022-03-21 NOTE — Progress Notes (Unsigned)
I, Andrew Benitez, LAT, ATC, am serving as scribe for Dr. Lynne Leader.  Andrew Benitez is a 71 y.o. male who presents to [place] today for f/u of R wrist, forearm and hand pain since 02/04/22 that began after buying a new guitar and playing about an hour/day since getting it and thought to be due to cubital tunnel syndrome.  He was last seen by Dr. 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  Today, pt reports that the wrist and arm is fine but still has numbness in the pinky and ring finger, palm of hand and wrist.  He notes the numbness and tingling is not very bothersome.  He denies any weakness.  He also notes returning of his right lumbar radicular symptoms.  He has pain radiating down the right leg to the lateral thigh and calf.  No weakness or numbness distally.  He had 2 epidural steroid injections.  He is interested in having a third injection.  Diagnostic testing: C-spine XR- 02/24/22  Pertinent review of systems: No fevers or chills  Relevant historical information: Macular degeneration.   Exam:  BP 110/62   Pulse (!) 59   Ht '5\' 11"'$  (1.803 m)   Wt 153 lb (69.4 kg)   SpO2 97%   BMI 21.34 kg/m  General: Well Developed, well nourished, and in no acute distress.   MSK: Right hand normal.  Normal strength.  L-spine normal lumbar motion.    Lab and Radiology Results  EXAM: MRI LUMBAR SPINE WITHOUT CONTRAST   TECHNIQUE: Multiplanar, multisequence MR imaging of the lumbar spine was performed. No intravenous contrast was administered.   COMPARISON:  X-ray lumbar 09/19/2021.   FINDINGS: Segmentation: Standard; the lowest formed disc space is designated L5-S1.   Alignment:  Normal.   Vertebrae: Vertebral body heights are preserved. Marrow signal is normal. There is no suspicious marrow signal abnormality. There is trace edema in the right L5  posterior elements, likely reactive/degenerative. There is no other marrow edema.   Conus medullaris and cauda equina: Conus extends to the L1 level. Conus and cauda equina appear normal.   Paraspinal and other soft tissues: A small T2 hyperintense lesion in the left kidney likely reflects a cyst. Is mild perifacetal soft tissue edema on the right at L4-L5. The paraspinal soft tissues are otherwise unremarkable.   Disc levels:   There is mild disc desiccation at L3-L4 through L5-S1. Facet arthropathy is most advanced at L4-L5.   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   T12-L1: No significant spinal canal or neural foraminal stenosis.   L1-L2: No significant spinal canal or neural foraminal stenosis.   L2-L3: There is a mild disc bulge and bilateral facet arthropathy superimposed on congenital canal narrowing resulting in mild-to-moderate spinal canal stenosis with narrowing of the subarticular zones without significant neural foraminal stenosis   L3-L4: There is a diffuse disc bulge, ligamentum flavum thickening, and bilateral facet arthropathy resulting in moderate spinal canal stenosis with narrowing of the bilateral subarticular zones, and mild bilateral neural foraminal stenosis   L4-L5: There is a diffuse disc bulge, ligamentum flavum thickening, and moderate bilateral facet arthropathy with a 5 mm medially projecting left synovial cyst resulting in severe spinal canal stenosis with compression of the cauda equina nerve roots and moderate right and mild left neural foraminal stenosis.  L5-S1: There is a diffuse disc bulge with a left subarticular zone protrusion and annular fissure and mild bilateral facet arthropathy resulting in impingement of the traversing left S1 nerve root. There is no significant neural foraminal stenosis.   IMPRESSION: 1. Degenerative changes at L4-L5 including a diffuse disc bulge  and moderate bilateral facet arthropathy with a small left facet joint synovial cyst superimposed on congenital canal narrowing resulting in severe spinal canal stenosis with impingement of the cauda equina nerve roots and moderate right and mild left neural foraminal stenosis. 2. Associated mild perifacetal soft tissue edema on the right at L4-L5 with mild reactive marrow edema in the right L5 posterior elements, which could reflect a source of pain. 3. Left subarticular zone disc protrusion and annular fissure at L5-S1 with impingement of the traversing left S1 nerve root. 4. Mild-to-moderate spinal canal stenosis at L2-L3 and moderate spinal canal stenosis at L3-L4.     Electronically Signed   By: Valetta Mole M.D.   On: 11/13/2021 10:44   I, Lynne Leader, personally (independently) visualized and performed the interpretation of the images attached in this note.     Assessment and Plan: 71 y.o. 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  Significantly improved with night splint.  Watchful waiting for now.  Consider nerve conduction study if needed.  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  He has had 2 epidural steroid injections in the past.  We will repeat the epidural steroid injection (his third since March).   He can have a fourth epidural steroid injection on or after September 2 if needed.   PDMP not reviewed this encounter. Orders Placed This Encounter  Procedures   DG INJECT DIAG/THERA/INC NEEDLE/CATH/PLC EPI/LUMB/SAC W/IMG    LUMB EPI #3  MCR/AARP-no auth req x's 2//js PACS (11/12/21) 152 LBSNO ASA  NO THINS/OTC  NO NEEDS PT AWARE NO SHOW FEE    Standing Status:   Future    Standing Expiration Date:   03/27/2023    Order Specific Question:   Reason for Exam (SYMPTOM  OR DIAGNOSIS REQUIRED)    Answer:   Repeat ESI #3 Rt symptoms likley L5. Level and technique per radiology    Order Specific Question:    Preferred Imaging Location?    Answer:   [id number]    Order Specific Question:   Radiology Contrast Protocol - do NOT remove file path    Answer:   [email or web address]   No orders of the defined types were placed in this encounter.    Discussed warning signs or symptoms. Please see discharge instructions. Patient expresses understanding.   The above documentation has been reviewed and is accurate and complete Lynne Leader, M.D.

## 2022-03-26 ENCOUNTER — Encounter: Payer: Self-pay | Admitting: Family Medicine

## 2022-03-26 ENCOUNTER — Ambulatory Visit (INDEPENDENT_AMBULATORY_CARE_PROVIDER_SITE_OTHER): Payer: Medicare Other | Admitting: Family Medicine

## 2022-03-26 VITALS — BP 110/62 | HR 59 | Ht 71.0 in | Wt 153.0 lb

## 2022-03-26 DIAGNOSIS — M5416 Radiculopathy, lumbar region: Secondary | ICD-10-CM

## 2022-03-26 DIAGNOSIS — G5621 Lesion of ulnar nerve, right upper limb: Secondary | ICD-10-CM | POA: Insufficient documentation

## 2022-03-26 HISTORY — DX: Lesion of ulnar nerve, right upper limb: G56.21

## 2022-03-26 NOTE — Patient Instructions (Addendum)
Good to see you   Let me know if the arm symptoms worsen.   Continue night splint  Please call [facility] at [phone] to schedule your spine injection.     Follow up as needed.

## 2022-04-01 ENCOUNTER — Telehealth: Payer: Self-pay | Admitting: Physician Assistant

## 2022-04-01 MED ORDER — ARIPIPRAZOLE 5 MG PO TABS: 2.5000 mg | ORAL_TABLET | Freq: Every day | ORAL | 1 refills | Status: DC

## 2022-04-01 NOTE — Telephone Encounter (Signed)
Pt notified Rx was sent to pharmacy. 

## 2022-04-01 NOTE — Telephone Encounter (Signed)
..   Encourage patient to contact the pharmacy for refills or they can request refills through [place]:  Please schedule appointment if longer than 1 year  02/14/22  NEXT APPOINTMENT DATE: 07/18/22  MEDICATION: ARIPiprazole (ABILIFY) 5 MG tablet  Is the patient out of medication? 1 week left  PHARMACY: [facility], [place] [address] Phone:  [phone]  Fax:  [phone]      Let patient know to contact pharmacy at the end of the day to make sure medication is ready.  Please notify patient to allow 48-72 hours to process

## 2022-04-04 ENCOUNTER — Ambulatory Visit
Admission: RE | Admit: 2022-04-04 | Discharge: 2022-04-04 | Disposition: A | Discharge status: Home / Self Care | Payer: Medicare Other | Source: Ambulatory Visit | Attending: Family Medicine | Admitting: Family Medicine

## 2022-04-04 DIAGNOSIS — M5416 Radiculopathy, lumbar region: Secondary | ICD-10-CM

## 2022-04-04 MED ORDER — IOPAMIDOL (ISOVUE-M 200) INJECTION 41%
1.0000 mL | Freq: Once | INTRAMUSCULAR | Status: AC
  Administered 2022-04-04: 1 mL via EPIDURAL

## 2022-04-04 MED ORDER — METHYLPREDNISOLONE ACETATE 40 MG/ML INJ SUSP (RADIOLOG
80.0000 mg | Freq: Once | INTRAMUSCULAR | Status: AC
  Administered 2022-04-04: 80 mg via EPIDURAL

## 2022-04-04 NOTE — Discharge Instructions (Signed)

## 2022-04-14 ENCOUNTER — Telehealth: Payer: Self-pay | Admitting: Physician Assistant

## 2022-04-14 NOTE — Telephone Encounter (Signed)
Patient requests the following RX's be sent to Pharmacy listed below due to cost.   DULoxetine (CYMBALTA) 30 MG capsule  AND  buPROPion (WELLBUTRIN XL) 300 MG 24 hr tablet  Be sent to:  [id number] - [address], [address] [place] AT [address] Phone:  [phone]  Fax:  [phone]

## 2022-04-14 NOTE — Telephone Encounter (Signed)
Forwarding to [place]

## 2022-04-15 MED ORDER — BUPROPION HCL ER (XL) 300 MG PO TB24: 300.0000 mg | ORAL_TABLET | Freq: Every day | ORAL | 3 refills | Status: DC

## 2022-04-15 MED ORDER — DULOXETINE HCL 30 MG PO CPEP: 30.0000 mg | ORAL_CAPSULE | Freq: Every day | ORAL | 3 refills | Status: DC

## 2022-04-15 NOTE — Telephone Encounter (Signed)
Pt notified both Rx's were sent to pharmacy as requested.

## 2022-05-23 ENCOUNTER — Other Ambulatory Visit: Payer: Self-pay | Admitting: *Deleted

## 2022-05-23 ENCOUNTER — Telehealth: Payer: Self-pay | Admitting: Physician Assistant

## 2022-05-23 ENCOUNTER — Other Ambulatory Visit: Payer: Self-pay | Admitting: Physician Assistant

## 2022-05-23 DIAGNOSIS — G8929 Other chronic pain: Secondary | ICD-10-CM

## 2022-05-23 DIAGNOSIS — M5416 Radiculopathy, lumbar region: Secondary | ICD-10-CM

## 2022-05-23 DIAGNOSIS — M545 Low back pain, unspecified: Secondary | ICD-10-CM

## 2022-05-23 NOTE — Telephone Encounter (Signed)
Okay to place referral

## 2022-05-23 NOTE — Telephone Encounter (Signed)
Referral placed. Left detailed message informing patient, patient advised to call office with any questions or concerns.

## 2022-05-23 NOTE — Telephone Encounter (Signed)
Reason for Referral Request:   6 month renewal of epidural   Has Patient been seen by PCP for this complaint? yes   Referral to which Specialty: [facility] Radiology[facility]Imaging   Pt requests:  -Call back when referral placed   Pt declined OV

## 2022-05-30 ENCOUNTER — Ambulatory Visit (INDEPENDENT_AMBULATORY_CARE_PROVIDER_SITE_OTHER): Payer: Medicare Other

## 2022-05-30 ENCOUNTER — Encounter: Payer: Self-pay | Admitting: Physician Assistant

## 2022-05-30 DIAGNOSIS — Z0000 Encounter for general adult medical examination without abnormal findings: Secondary | ICD-10-CM | POA: Diagnosis not present

## 2022-05-30 NOTE — Discharge Instructions (Signed)

## 2022-05-30 NOTE — Progress Notes (Signed)
Virtual Visit via Telephone Note  I connected with  Andrew Benitez on 05/30/22 at  1:30 PM EDT by telephone and verified that I am speaking with the correct person using two identifiers.  Medicare Annual Wellness visit completed telephonically due to Covid-19 pandemic.   Persons participating in this call: This Health Coach and this patient.   Location: Patient: home Provider: office    I discussed the limitations, risks, security and privacy concerns of performing an evaluation and management service by telephone and the availability of in person appointments. The patient expressed understanding and agreed to proceed.  Unable to perform video visit due to video visit attempted and failed and/or patient does not have video capability.   Some vital signs may be absent or patient reported.   Willette Brace, LPN   Subjective:   Andrew Benitez is a 71 y.o. male who presents for Medicare Annual/Subsequent preventive examination.  Review of Systems     Cardiac Risk Factors include: advanced age (>57mn, >>58women)     Objective:    There were no vitals filed for this visit. There is no height or weight on file to calculate BMI.     05/30/2022    1:38 PM 10/06/2021    1:30 PM 08/21/2021   12:47 PM 05/29/2021    1:28 PM  Advanced Directives  Does Patient Have a Medical Advance Directive? Yes No No Yes  Type of AP[place]amed[place]of [place]   [place] will  Does patient want to make changes to medical advance directive? No - Patient declined     Copy of [place] Chart? Yes - validated most recent copy scanned in chart (See row information)   No - copy requested  Would patient like information on creating a medical advance directive?  No - Patient declined No - Patient declined     Current Medications (verified) Outpatient Encounter Medications as of 05/30/2022  Medication Sig   ARIPiprazole (ABILIFY) 5 MG  tablet Take 0.5 tablets (2.5 mg total) by mouth daily.   buPROPion (WELLBUTRIN XL) 300 MG 24 hr tablet Take 1 tablet (300 mg total) by mouth daily.   DULoxetine (CYMBALTA) 30 MG capsule Take 1 capsule (30 mg total) by mouth daily.   ferrous sulfate 325 (65 FE) MG tablet Take 325 mg by mouth every other day.   FIBER PO Take 1 each by mouth in the morning and at bedtime. Tablespoon   finasteride (PROPECIA) 1 MG tablet Take 1 tablet (1 mg total) by mouth daily.   Multiple Vitamins-Minerals (PRESERVISION AREDS 2) CAPS Take 2 capsules by mouth daily in the afternoon. With meals breakfast and dinner   vitamin C (ASCORBIC ACID) 500 MG tablet Take 500 mg by mouth daily.   [DISCONTINUED] Omega-3 Fatty Acids (FISH OIL) 1000 MG CAPS Take 1 capsule by mouth daily.   No facility-administered encounter medications on file as of 05/30/2022.    Allergies (verified) Patient has no known allergies.   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)   Past Surgical History:  Procedure Laterality Date   BREAST SURGERY  02/24/1981   Gynecomastia; [place] an [place]   MASTECTOMY FOR GYNECOMASTIA Left 1983   SKIN CANCER EXCISION  2018  right side of neck and left forehead   TONSILLECTOMY     Family History  Problem Relation Age of Onset   Alcohol abuse Mother    Arthritis Mother    COPD Mother    Vision loss Mother    Alcohol abuse Father    Depression Father    Early death Father    Breast cancer Sister    Alcohol abuse Sister    Depression Sister    Asthma Sister    Hyperlipidemia Sister    Heart attack Paternal Grandmother    Social History   Socioeconomic History   Marital status: Married    Spouse name: Not on file   Number of children: Not on file   Years of education: Not on file   Highest education  level: Not on file  Occupational History   Not on file  Tobacco Use   Smoking status: Never   Smokeless tobacco: Never  Vaping Use   Vaping Use: Never used  Substance and Sexual Activity   Alcohol use: Yes    Alcohol/week: 1.0 standard drink of alcohol    Types: 1 Cans of beer per week    Comment: one beer every  week   Drug use: Yes    Frequency: 2.0 times per week    Types: Marijuana    Comment: once a week   Sexual activity: Not Currently    Comment: Vasectomy  Other Topics Concern   Not on file  Social History Narrative   Married   Two children, one in [place]   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   Social Determinants of Health   Financial Resource Strain: Low Risk  (05/30/2022)   Overall Financial Resource Strain (CARDIA)    Difficulty of Paying Living Expenses: Not hard at all  Food Insecurity: No Food Insecurity (05/30/2022)   Hunger Vital Sign    Worried About Running Out of Food in the Last Year: Never true    [place] in the Last Year: Never true  Transportation Needs: No Transportation Needs (05/30/2022)   PRAPARE - Hydrologist (Medical): No    Lack of Transportation (Non-Medical): No  Physical Activity: Sufficiently Active (05/30/2022)   Exercise Vital Sign    Days of Exercise per Week: 7 days    Minutes of Exercise per Session: 30 min  Stress: No Stress Concern Present (05/30/2022)   [place]    Feeling of Stress : Not at all  Social Connections: Moderately Integrated (05/30/2022)   Social Connection and Isolation Panel [NHANES]    Frequency of Communication with Friends and Family: More than three times a week    Frequency of Social Gatherings with Friends and Family: More than three times a week    Attends Religious Services: Never    Marine scientist or Organizations: Yes    Attends Music therapist: 1 to 4 times per year    Marital  Status: Married    Tobacco Counseling Counseling given: Not Answered   Clinical Intake:  Pre-visit preparation completed: Yes  Pain : No/denies pain     BMI - recorded: 21.35 Nutritional Status: BMI of 19-24  Normal Nutritional Risks: None Diabetes: No  How often do you need to have someone help you when you read instructions, pamphlets, or other written materials from your doctor or pharmacy?: 1 - Never  Diabetic?no  Interpreter Needed?: No  Information entered  by :: Charlott Rakes, LPN   Activities of Daily Living    05/30/2022    1:40 PM  In your present state of health, do you have any difficulty performing the following activities:  Hearing? 1  Comment slight loss  Vision? 0  Difficulty concentrating or making decisions? 0  Walking or climbing stairs? 0  Dressing or bathing? 0  Doing errands, shopping? 0  Preparing Food and eating ? N  Using the Toilet? N  In the past six months, have you accidently leaked urine? N  Do you have problems with loss of bowel control? N  Managing your Medications? N  Managing your Finances? N  Housekeeping or managing your Housekeeping? N    Patient Care Team: Inda Coke, [place] as PCP - General (Physician Assistant)  Indicate any recent Medical Services you may have received from other than [facility] providers in the past year (date may be approximate).     Assessment:   This is a routine wellness examination for Andrew Benitez.  Hearing/Vision screen Hearing Screening - Comments:: Pt stated slight loss  Vision Screening - Comments:: Pt follows up with [facility]   Dietary issues and exercise activities discussed: Current Exercise Habits: Home exercise routine, Type of exercise: walking;Other - see comments, Time (Minutes): 30, Frequency (Times/Week): 7, Weekly Exercise (Minutes/Week): 210   Goals Addressed             This Visit's Progress    Patient Stated       Walk 5000 steps a day 20 heart point on  your app        Depression Screen    05/30/2022    1:37 PM 02/14/2022   10:20 AM 07/17/2021   10:48 AM 05/29/2021    1:30 PM 05/29/2021    1:22 PM 03/05/2021    2:46 PM 07/06/2020   11:21 AM  PHQ 2/9 Scores  PHQ - 2 Score 0 0 0 0 1 0 0  PHQ- 9 Score  1 1    0    Fall Risk    05/30/2022    1:39 PM 07/17/2021   10:34 AM 05/29/2021    1:30 PM  [place] in the past year? 0 1 1  Number falls in past yr: 0 0 0  Injury with Fall? 0 0 0  Risk for fall due to : No Fall Risks;Impaired vision;Impaired balance/gait  No Fall Risks  Follow up Falls prevention discussed Falls evaluation completed     FALL RISK PREVENTION PERTAINING TO THE HOME:  Any stairs in or around the home? Yes  If so, are there any without handrails? No  Home free of loose throw rugs in walkways, pet beds, electrical cords, etc? Yes  Adequate lighting in your home to reduce risk of falls? Yes   ASSISTIVE DEVICES UTILIZED TO PREVENT FALLS:  Life alert? No  Use of a cane, walker or w/c? No  Grab bars in the bathroom? No  Shower chair or bench in shower? No  Elevated toilet seat or a handicapped toilet? No   TIMED UP AND GO:  Was the test performed? No .   Cognitive Function:        05/30/2022    1:40 PM 05/29/2021    1:32 PM  6CIT Screen  What Year? 0 points 0 points  What month? 0 points 0 points  What time? 0 points 0 points  Count back from 20 0 points 0 points  Months  in reverse 0 points 0 points  Repeat phrase 0 points 0 points  Total Score 0 points 0 points    Immunizations Immunization History  Administered Date(s) Administered   Fluad Quad(high Dose 65+) 07/06/2020   Influenza, High Dose Seasonal PF 05/12/2019, 06/19/2021   PFIZER(Purple Top)SARS-COV-2 Vaccination 11/19/2019, 12/10/2019, 06/18/2020, 01/26/2021   Pfizer Covid-19 Vaccine Bivalent Booster 28yr & up 06/26/2021   Pneumococcal Conjugate-13 05/11/2017   Pneumococcal Polysaccharide-23 06/24/2018   Tdap 05/08/2016    Zoster Recombinat (Shingrix) 09/29/2021, 01/27/2022    TDAP status: Up to date  Flu Vaccine status: Due, Education has been provided regarding the importance of this vaccine. Advised may receive this vaccine at local pharmacy or Health Dept. Aware to provide a copy of the vaccination record if obtained from local pharmacy or Health Dept. Verbalized acceptance and understanding.  Pneumococcal vaccine status: Up to date  Covid-19 vaccine status: Completed vaccines  Qualifies for Shingles Vaccine? Yes   Zostavax completed Yes   Shingrix Completed?: Yes  Screening Tests Health Maintenance  Topic Date Due   COVID-19 Vaccine (6 - Pfizer risk series) 08/21/2021   INFLUENZA VACCINE  04/15/2022   TETANUS/TDAP  05/08/2026   COLONOSCOPY (Pts [id number] coverage will need to be confirmed)  11/06/2026   Pneumonia Vaccine 6552Years old  Completed   Hepatitis C Screening  Completed   Zoster Vaccines- Shingrix  Completed   HPV VACCINES  Aged Out    Health Maintenance  Health Maintenance Due  Topic Date Due   COVID-19 Vaccine (6 - Pfizer risk series) 08/21/2021   INFLUENZA VACCINE  04/15/2022    Colorectal cancer screening: Type of screening: Colonoscopy. Completed 11/06/16. Repeat every 10 years   Additional Screening:  Hepatitis C Screening:  Completed 07/06/20  Vision Screening: Recommended annual ophthalmology exams for early detection of glaucoma and other disorders of the eye. Is the patient up to date with their annual eye exam?  Yes  Who is the provider or what is the name of the office in which the patient attends annual eye exams? [facility]  If pt is not established with a provider, would they like to be referred to a provider to establish care? No .   Dental Screening: Recommended annual dental exams for proper oral hygiene  Community Resource Referral / Chronic Care Management: CRR required this visit?  No   CCM required this visit?  No       Plan:     I have personally reviewed and noted the following in the patient's chart:   Medical and social history Use of alcohol, tobacco or illicit drugs  Current medications and supplements including opioid prescriptions. Patient is not currently taking opioid prescriptions. Functional ability and status Nutritional status Physical activity Advanced directives List of other physicians Hospitalizations, surgeries, and ER visits in previous 12 months Vitals Screenings to include cognitive, depression, and falls Referrals and appointments  In addition, I have reviewed and discussed with patient certain preventive protocols, quality metrics, and best practice recommendations. A written personalized care plan for preventive services as well as general preventive health recommendations were provided to patient.     TiWillette BraceLPN   05/20/56/3220 Nurse Notes: none

## 2022-05-30 NOTE — Patient Instructions (Signed)
Andrew Benitez , Thank you for taking time to come for your Medicare Wellness Visit. I appreciate your ongoing commitment to your health goals. Please review the following plan we discussed and let me know if I can assist you in the future.   Screening recommendations/referrals: Colonoscopy: FOBT 03/07/21 repeat as directed  Recommended yearly ophthalmology/optometry visit for glaucoma screening and checkup Recommended yearly dental visit for hygiene and checkup  Vaccinations: Influenza vaccine: due  Pneumococcal vaccine: Up to date Tdap vaccine: done 05/08/16 repeat every 10 years  Shingles vaccine: completed 1/15, 01/27/22   Covid-19: completed 3/6, 3/27, 06/18/20 & 5/14, 06/26/21  Advanced directives: copies in chart   Conditions/risks identified: Walk 5000 steps a day 20 heart point on your app   Next appointment: Follow up in one year for your annual wellness visit.   Preventive Care 35 Years and Older, Male Preventive care refers to lifestyle choices and visits with your health care provider that can promote health and wellness. What does preventive care include? A yearly physical exam. This is also called an annual well check. Dental exams once or twice a year. Routine eye exams. Ask your health care provider how often you should have your eyes checked. Personal lifestyle choices, including: Daily care of your teeth and gums. Regular physical activity. Eating a healthy diet. Avoiding tobacco and drug use. Limiting alcohol use. Practicing safe sex. Taking low doses of aspirin every day. Taking vitamin and mineral supplements as recommended by your health care provider. What happens during an annual well check? The services and screenings done by your health care provider during your annual well check will depend on your age, overall health, lifestyle risk factors, and family history of disease. Counseling  Your health care provider may ask you questions about your: Alcohol  use. Tobacco use. Drug use. Emotional well-being. Home and relationship well-being. Sexual activity. Eating habits. History of falls. Memory and ability to understand (cognition). Work and work Statistician. Screening  You may have the following tests or measurements: Height, weight, and BMI. Blood pressure. Lipid and cholesterol levels. These may be checked every 5 years, or more frequently if you are over 71 years old. Skin check. Lung cancer screening. You may have this screening every year starting at age 71 if you have a 30-pack-year history of smoking and currently smoke or have quit within the past 15 years. Fecal occult blood test (FOBT) of the stool. You may have this test every year starting at age 71. Flexible sigmoidoscopy or colonoscopy. You may have a sigmoidoscopy every 5 years or a colonoscopy every 10 years starting at age 71. Prostate cancer screening. Recommendations will vary depending on your family history and other risks. Hepatitis C blood test. Hepatitis B blood test. Sexually transmitted disease (STD) testing. Diabetes screening. This is done by checking your blood sugar (glucose) after you have not eaten for a while (fasting). You may have this done every 1-3 years. Abdominal aortic aneurysm (AAA) screening. You may need this if you are a current or former smoker. Osteoporosis. You may be screened starting at age 71 if you are at high risk. Talk with your health care provider about your test results, treatment options, and if necessary, the need for more tests. Vaccines  Your health care provider may recommend certain vaccines, such as: Influenza vaccine. This is recommended every year. Tetanus, diphtheria, and acellular pertussis (Tdap, Td) vaccine. You may need a Td booster every 10 years. Zoster vaccine. You may need this after age  71. Pneumococcal 13-valent conjugate (PCV13) vaccine. One dose is recommended after age 71. Pneumococcal polysaccharide  (PPSV23) vaccine. One dose is recommended after age 71. Talk to your health care provider about which screenings and vaccines you need and how often you need them. This information is not intended to replace advice given to you by your health care provider. Make sure you discuss any questions you have with your health care provider. Document Released: 09/28/2015 Document Revised: 05/21/2016 Document Reviewed: 07/03/2015 Elsevier Interactive Patient Education  2017 [place] Prevention in the Home Falls can cause injuries. They can happen to people of all ages. There are many things you can do to make your home safe and to help prevent falls. What can I do on the outside of my home? Regularly fix the edges of walkways and driveways and fix any cracks. Remove anything that might make you trip as you walk through a door, such as a raised step or threshold. Trim any bushes or trees on the path to your home. Use bright outdoor lighting. Clear any walking paths of anything that might make someone trip, such as rocks or tools. Regularly check to see if handrails are loose or broken. Make sure that both sides of any steps have handrails. Any raised decks and porches should have guardrails on the edges. Have any leaves, snow, or ice cleared regularly. Use sand or salt on walking paths during winter. Clean up any spills in your garage right away. This includes oil or grease spills. What can I do in the bathroom? Use night lights. Install grab bars by the toilet and in the tub and shower. Do not use towel bars as grab bars. Use non-skid mats or decals in the tub or shower. If you need to sit down in the shower, use a plastic, non-slip stool. Keep the floor dry. Clean up any water that spills on the floor as soon as it happens. Remove soap buildup in the tub or shower regularly. Attach bath mats securely with double-sided non-slip rug tape. Do not have throw rugs and other things on the  floor that can make you trip. What can I do in the bedroom? Use night lights. Make sure that you have a light by your bed that is easy to reach. Do not use any sheets or blankets that are too big for your bed. They should not hang down onto the floor. Have a firm chair that has side arms. You can use this for support while you get dressed. Do not have throw rugs and other things on the floor that can make you trip. What can I do in the kitchen? Clean up any spills right away. Avoid walking on wet floors. Keep items that you use a lot in easy-to-reach places. If you need to reach something above you, use a strong step stool that has a grab bar. Keep electrical cords out of the way. Do not use floor polish or wax that makes floors slippery. If you must use wax, use non-skid floor wax. Do not have throw rugs and other things on the floor that can make you trip. What can I do with my stairs? Do not leave any items on the stairs. Make sure that there are handrails on both sides of the stairs and use them. Fix handrails that are broken or loose. Make sure that handrails are as long as the stairways. Check any carpeting to make sure that it is firmly attached to the stairs. Fix  any carpet that is loose or worn. Avoid having throw rugs at the top or bottom of the stairs. If you do have throw rugs, attach them to the floor with carpet tape. Make sure that you have a light switch at the top of the stairs and the bottom of the stairs. If you do not have them, ask someone to add them for you. What else can I do to help prevent falls? Wear shoes that: Do not have high heels. Have rubber bottoms. Are comfortable and fit you well. Are closed at the toe. Do not wear sandals. If you use a stepladder: Make sure that it is fully opened. Do not climb a closed stepladder. Make sure that both sides of the stepladder are locked into place. Ask someone to hold it for you, if possible. Clearly mark and make  sure that you can see: Any grab bars or handrails. First and last steps. Where the edge of each step is. Use tools that help you move around (mobility aids) if they are needed. These include: Canes. Walkers. Scooters. Crutches. Turn on the lights when you go into a dark area. Replace any light bulbs as soon as they burn out. Set up your furniture so you have a clear path. Avoid moving your furniture around. If any of your floors are uneven, fix them. If there are any pets around you, be aware of where they are. Review your medicines with your doctor. Some medicines can make you feel dizzy. This can increase your chance of falling. Ask your doctor what other things that you can do to help prevent falls. This information is not intended to replace advice given to you by your health care provider. Make sure you discuss any questions you have with your health care provider. Document Released: 06/28/2009 Document Revised: 02/07/2016 Document Reviewed: 10/06/2014 Elsevier Interactive Patient Education  2017 [employer].

## 2022-06-02 ENCOUNTER — Ambulatory Visit
Admission: RE | Admit: 2022-06-02 | Discharge: 2022-06-02 | Disposition: A | Discharge status: Home / Self Care | Payer: Medicare Other | Source: Ambulatory Visit | Attending: Physician Assistant | Admitting: Physician Assistant

## 2022-06-02 ENCOUNTER — Other Ambulatory Visit: Payer: Medicare Other

## 2022-06-02 DIAGNOSIS — M545 Low back pain, unspecified: Secondary | ICD-10-CM

## 2022-06-02 MED ORDER — METHYLPREDNISOLONE ACETATE 40 MG/ML INJ SUSP (RADIOLOG
80.0000 mg | Freq: Once | INTRAMUSCULAR | Status: AC
  Administered 2022-06-02: 80 mg via EPIDURAL

## 2022-06-02 MED ORDER — IOPAMIDOL (ISOVUE-M 200) INJECTION 41%
1.0000 mL | Freq: Once | INTRAMUSCULAR | Status: AC
  Administered 2022-06-02: 1 mL via EPIDURAL

## 2022-06-08 ENCOUNTER — Encounter: Payer: Self-pay | Admitting: Physician Assistant

## 2022-07-17 NOTE — Progress Notes (Signed)
Subjective:    Andrew Benitez is a 71 y.o. male and is here for a comprehensive physical exam.  HPI  There are no preventive care reminders to display for this patient.  Acute Concerns: Pain in left shoulder Started in the past year Hurts with certain overhead movements and reaching behind back and at nighttime R-handed Not significant enough to require medication Denies known inciting event Does work with [employer]  Chronic Issues: Low Ferritin As directed, Andrew Benitez has been taking his ferrous sulfate 325 mg daily with no complications. States he tries to take this pill with orange juice prior to eating breakfast. At this time he is interested in re-checking this today to see if this supplement has improved levels.  He has continued to hold off on blood donations but is wondering if he can resume this.   Hair Loss  Compliant with Propecia 1 mg daily with no complications. Doing minoxidil twice a day.   Depression Andrew Benitez is currently compliant with taking Cymbalta 30 mg daily, Wellbutrin XL 300 mg daily, and Abilify 2.5 mg daily with no adverse effects. Pt expresses that this medication has been beneficial to him. At this time he is tolerating well. Denies SI/HI.   Health Maintenance: Immunizations -- UTD COVID- Last completed 01/26/21 ([place]- 4 doses)  Tdap- Last completed 05/08/16  Colonoscopy -- Last completed 11/06/16  --> unsure of when due for recall - will request records PSA --  Lab Results  Component Value Date   PSA 1.37 07/17/2021   PSA 0.51 07/06/2020   PSA 1.158 06/01/2019   Diet -- eating a mostly Mediterranean Diet loosely Sleep habits -- no major concerns Exercise -- Fitness tracked -- Walks daily and tries to reach 5000 steps a day. Also volunteers at [employer] twice a week.   Weight- overall stable -- no major concerns Recent weight history Wt Readings from Last 10 Encounters:  03/26/22 153 lb (69.4 kg)  02/24/22 155 lb 9.6 oz  (70.6 kg)  02/14/22 154 lb (69.9 kg)  01/06/22 157 lb 4 oz (71.3 kg)  12/12/21 157 lb (71.2 kg)  10/16/21 157 lb (71.2 kg)  10/06/21 152 lb (68.9 kg)  10/04/21 158 lb (71.7 kg)  10/02/21 158 lb (71.7 kg)  09/19/21 159 lb (72.1 kg)   There is no height or weight on file to calculate BMI.  Mood -- overall stable Alcohol use --  reports current alcohol use of about 1.0 standard drink of alcohol per week.  Tobacco use --  Tobacco Use: Low Risk  (07/18/2022)   Patient History    Smoking Tobacco Use: Never    Smokeless Tobacco Use: Never    Passive Exposure: Not on file    Eligible for Low Dose CT? no  UTD with eye doctor? UTD UTD with dentist? UTD     07/18/2022   11:12 AM  Depression screen PHQ 2/9  Decreased Interest 0  Down, Depressed, Hopeless 0  PHQ - 2 Score 0  Altered sleeping 0  Tired, decreased energy 3  Change in appetite 0  Feeling bad or failure about yourself  0  Trouble concentrating 0  Moving slowly or fidgety/restless 0  PHQ-9 Score 3  Difficult doing work/chores Not difficult at all    Other providers/specialists: Patient Care Team: Inda Coke, [place] as PCP - General (Physician Assistant)    PMHx, SurgHx, SocialHx, Medications, and Allergies were reviewed in the Visit Navigator and updated as appropriate.   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)     Past Surgical History:  Procedure Laterality Date   BREAST SURGERY  02/24/1981   Gynecomastia; date is an estimate   CYSTOSCOPY WITH INSERTION OF UROLIFT  2018   MASTECTOMY FOR GYNECOMASTIA Left 1983   SKIN CANCER EXCISION  2018   right side of neck and left forehead   TONSILLECTOMY       Family History  Problem Relation Age of Onset   Alcohol abuse Mother    Arthritis Mother    COPD Mother    Vision loss Mother    Alcohol abuse Father    Depression  Father    Early death Father    Breast cancer Sister    Alcohol abuse Sister    Depression Sister    Asthma Sister    Hyperlipidemia Sister    Heart attack Paternal Grandmother     Social History   Tobacco Use   Smoking status: Never   Smokeless tobacco: Never  Vaping Use   Vaping Use: Never used  Substance Use Topics   Alcohol use: Yes    Alcohol/week: 1.0 standard drink of alcohol    Types: 1 Cans of beer per week    Comment: one beer every  week   Drug use: Yes    Frequency: 2.0 times per week    Types: Marijuana    Comment: once a week    Review of Systems:   Review of Systems  Constitutional:  Negative for chills, fever, malaise/fatigue and weight loss.  HENT:  Negative for hearing loss, sinus pain and sore throat.   Respiratory:  Negative for cough and hemoptysis.   Cardiovascular:  Negative for chest pain, palpitations, leg swelling and PND.  Gastrointestinal:  Negative for abdominal pain, constipation, diarrhea, heartburn, nausea and vomiting.  Genitourinary:  Negative for dysuria, frequency and urgency.  Musculoskeletal:  Negative for back pain, myalgias and neck pain.  Skin:  Negative for itching and rash.  Neurological:  Negative for dizziness, tingling, seizures and headaches.  Endo/Heme/Allergies:  Negative for polydipsia.  Psychiatric/Behavioral:  Negative for depression. The patient is not nervous/anxious.     Objective:    Vitals:   07/18/22 1108  BP: 100/60  Pulse: (!) 56  SpO2: 98%    There is no height or weight on file to calculate BMI.  General  Alert, cooperative, no distress, appears stated age  Head:  Normocephalic, without obvious abnormality, atraumatic  Eyes:  PERRL, conjunctiva/corneas clear, EOM's intact, fundi benign, both eyes       Ears:  Normal TM's and external ear canals, both ears  Nose: Nares normal, septum midline, mucosa normal, no drainage or sinus tenderness  Throat: Lips, mucosa, and tongue normal; teeth and gums  normal  Neck: Supple, symmetrical, trachea midline, no adenopathy;     thyroid:  No enlargement/tenderness/nodules; no carotid bruit or JVD  Back:   Symmetric, no curvature, ROM normal, no CVA tenderness  Lungs:   Clear to auscultation bilaterally, respirations unlabored  Chest wall:  No tenderness or deformity  Heart:  Regular rate and rhythm, S1 and S2 normal, no murmur, rub or gallop  Abdomen:   Soft, non-tender, bowel sounds active all four quadrants, no masses, no organomegaly  Extremities: Extremities normal, atraumatic, no cyanosis or edema  Prostate : Not done   Skin: Skin  color, texture, turgor normal, no rashes or lesions  Lymph nodes: Cervical, supraclavicular, and axillary nodes normal  Neurologic: CNII-XII grossly intact. Normal strength, sensation and reflexes throughout   AssessmentPlan:   Routine physical examination Today patient counseled on age appropriate routine health concerns for screening and prevention, each reviewed and up to date or declined. Immunizations reviewed and up to date or declined. Labs ordered and reviewed. Risk factors for depression reviewed and negative. Hearing function and visual acuity are intact. ADLs screened and addressed as needed. Functional ability and level of safety reviewed and appropriate. Education, counseling and referrals performed based on assessed risks today. Patient provided with a copy of personalized plan for preventive services.  Major depressive disorder, remission status unspecified, unspecified whether recurrent Well controlled  Continue Cymbalta 30 mg daily, Wellbutrin XL 300 mg daily, and Abilify 2.5 mg daily  Denies SI/HI Follow-up in 1 year, sooner if concerns  Hair loss Well controlled Continue finasteride 1 mg and minoxidl Follow-up in 1 year, sooner if concerns  Low ferritin Update blood work today and advise on supplementation and future blood donations  High risk medication use Update lipid  panel  Nocturia Update PSA level per patient request    Inda Coke, PA-C [place]

## 2022-07-18 ENCOUNTER — Ambulatory Visit (INDEPENDENT_AMBULATORY_CARE_PROVIDER_SITE_OTHER): Payer: Medicare Other | Admitting: Physician Assistant

## 2022-07-18 ENCOUNTER — Encounter: Payer: Self-pay | Admitting: Physician Assistant

## 2022-07-18 VITALS — BP 100/60 | HR 56

## 2022-07-18 DIAGNOSIS — L659 Nonscarring hair loss, unspecified: Secondary | ICD-10-CM | POA: Diagnosis not present

## 2022-07-18 DIAGNOSIS — Z0000 Encounter for general adult medical examination without abnormal findings: Secondary | ICD-10-CM

## 2022-07-18 DIAGNOSIS — R790 Abnormal level of blood mineral: Secondary | ICD-10-CM

## 2022-07-18 DIAGNOSIS — Z79899 Other long term (current) drug therapy: Secondary | ICD-10-CM | POA: Diagnosis not present

## 2022-07-18 DIAGNOSIS — F329 Major depressive disorder, single episode, unspecified: Secondary | ICD-10-CM

## 2022-07-18 DIAGNOSIS — R351 Nocturia: Secondary | ICD-10-CM

## 2022-07-18 LAB — CBC WITH DIFFERENTIAL/PLATELET
Basophils Absolute: 0.1 10*3/uL (ref 0.0–0.1)
Basophils Relative: 1.2 % (ref 0.0–3.0)
Eosinophils Absolute: 0 10*3/uL (ref 0.0–0.7)
Eosinophils Relative: 0.9 % (ref 0.0–5.0)
HCT: 42 % (ref 39.0–52.0)
Hemoglobin: 13.8 g/dL (ref 13.0–17.0)
Lymphocytes Relative: 42.6 % (ref 12.0–46.0)
Lymphs Abs: 2 10*3/uL (ref 0.7–4.0)
MCHC: 32.8 g/dL (ref 30.0–36.0)
MCV: 86.9 fl (ref 78.0–100.0)
Monocytes Absolute: 0.6 10*3/uL (ref 0.1–1.0)
Monocytes Relative: 12.7 % — ABNORMAL HIGH (ref 3.0–12.0)
Neutro Abs: 2 10*3/uL (ref 1.4–7.7)
Neutrophils Relative %: 42.6 % — ABNORMAL LOW (ref 43.0–77.0)
Platelets: 214 10*3/uL (ref 150.0–400.0)
RBC: 4.83 Mil/uL (ref 4.22–5.81)
RDW: 13.8 % (ref 11.5–15.5)
WBC: 4.8 10*3/uL (ref 4.0–10.5)

## 2022-07-18 LAB — COMPREHENSIVE METABOLIC PANEL
ALT: 23 U/L (ref 0–53)
AST: 24 U/L (ref 0–37)
Albumin: 4.4 g/dL (ref 3.5–5.2)
Alkaline Phosphatase: 54 U/L (ref 39–117)
BUN: 15 mg/dL (ref 6–23)
CO2: 29 mEq/L (ref 19–32)
Calcium: 9.3 mg/dL (ref 8.4–10.5)
Chloride: 103 mEq/L (ref 96–112)
Creatinine, Ser: 0.83 mg/dL (ref 0.40–1.50)
GFR: 88.11 mL/min (ref >60.00)
Glucose, Bld: 82 mg/dL (ref 70–99)
Potassium: 5.2 mEq/L — ABNORMAL HIGH (ref 3.5–5.1)
Sodium: 138 mEq/L (ref 135–145)
Total Bilirubin: 0.4 mg/dL (ref 0.2–1.2)
Total Protein: 6.7 g/dL (ref 6.0–8.3)

## 2022-07-18 LAB — LIPID PANEL
Cholesterol: 156 mg/dL (ref 0–200)
HDL: 46 mg/dL (ref >39.00)
LDL Cholesterol: 96 mg/dL (ref 0–99)
NonHDL: 109.68
Total CHOL/HDL Ratio: 3
Triglycerides: 67 mg/dL (ref 0.0–149.0)
VLDL: 13.4 mg/dL (ref 0.0–40.0)

## 2022-07-18 LAB — PSA: PSA: 0.44 ng/mL (ref 0.10–4.00)

## 2022-07-18 LAB — IBC + FERRITIN
Ferritin: 36.1 ng/mL (ref 22.0–322.0)
Iron: 174 ug/dL — ABNORMAL HIGH (ref 42–165)
Saturation Ratios: 48.5 % (ref 20.0–50.0)
TIBC: 358.4 ug/dL (ref 250.0–450.0)
Transferrin: 256 mg/dL (ref 212.0–360.0)

## 2022-07-18 NOTE — Patient Instructions (Signed)
It was great to see you! ? ?Please go to the lab for blood work.  ? ?Our office will call you with your results unless you have chosen to receive results via MyChart. ? ?If your blood work is normal we will follow-up each year for physicals and as scheduled for chronic medical problems. ? ?If anything is abnormal we will treat accordingly and get you in for a follow-up. ? ?Take care, ? ?Andrew Benitez ?  ? ? ?

## 2022-07-19 ENCOUNTER — Encounter: Payer: Self-pay | Admitting: Physician Assistant

## 2022-07-22 NOTE — Telephone Encounter (Signed)
Patient requests renewal of epidural Referral be sent to [facility].  Patient states that he has already had the September epidural and requires new Referral for November.

## 2022-07-22 NOTE — Telephone Encounter (Signed)
Please see message and advise if okay to place referral?

## 2022-07-22 NOTE — Telephone Encounter (Signed)
Spoke to pt told him I have placed the referral for Interventional Radiology for Epidural to be done. Someone will contact you to schedule. Pt verbalized understanding.

## 2022-07-22 NOTE — Addendum Note (Signed)
Addended by: Marian Sorrow on: 07/22/2022 03:57 PM   Modules accepted: Orders

## 2022-07-24 ENCOUNTER — Other Ambulatory Visit: Payer: Self-pay | Admitting: Physician Assistant

## 2022-07-24 DIAGNOSIS — M5416 Radiculopathy, lumbar region: Secondary | ICD-10-CM

## 2022-07-27 ENCOUNTER — Encounter: Payer: Self-pay | Admitting: Physician Assistant

## 2022-07-27 DIAGNOSIS — S46012D Strain of muscle(s) and tendon(s) of the rotator cuff of left shoulder, subsequent encounter: Secondary | ICD-10-CM

## 2022-07-28 NOTE — Telephone Encounter (Signed)
Please see message okay to schedule Physical Therapy?

## 2022-07-31 NOTE — Therapy (Signed)
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)   Past Surgical History:  Procedure Laterality Date   BREAST SURGERY  02/24/1981   Gynecomastia; date is an estimate   [place]  2018   MASTECTOMY FOR GYNECOMASTIA Left 1983   SKIN CANCER EXCISION  2018   right side of neck and left forehead   TONSILLECTOMY     Patient Active Problem List   Diagnosis Date Noted   Cubital tunnel syndrome, 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   Lumbar radiculopathy 03/26/2022   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   Macular RPE mottling 11/13/2021   Nonexudative age-related macular degeneration, left eye, 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   Macular degeneration of both eyes 09/20/2021   Low back pain 09/19/2021   Depression     PCP: Inda Coke, PA  REFERRING PROVIDER: Inda Coke, PA  REFERRING DIAG: [phone] (ICD-10-CM) - Strain of left rotator cuff capsule, subsequent encounter  THERAPY DIAG:  Acute pain of left shoulder - Plan: PT plan of care cert/re-cert  Muscle weakness (generalized) - Plan: PT plan of care cert/re-cert  Rationale for Evaluation and Treatment: Rehabilitation  ONSET DATE: May  2023   SUBJECTIVE:                                                                                                                                                                                       SUBJECTIVE STATEMENT: States that he has been having left shoulder pain over the last few months. States laying on his left shoulder he feels it and at various time during the day and when he reaches behind to reach behind his back. States that he was also having numbness in his right two fingers but that is a little bit better. He is wanting to be treated for his left shoulder pain.  PERTINENT HISTORY: benign enlarged prostate with baseline urgency, no other changes in bowel/bladder.   PAIN:  Are you having pain? Yes: NPRS scale: 1/10 Pain location: left shoulder  Pain description: dull achy Aggravating factors: laying on it, palpation, reaching behind back Relieving factors: rest  PRECAUTIONS: None  WEIGHT BEARING RESTRICTIONS: No  FALLS:  Has patient fallen in last 6 months? No    PLOF: Independent  PATIENT GOALS:to have less pain and have HEP to work on for shoulder at home  NEXT MD VISIT:   OBJECTIVE:   DIAGNOSTIC FINDINGS:  02/24/22 cervical spine xray IMPRESSION: 1. No evidence for fracture or malalignment. 2. Mild degenerative changes.     COGNITION: Overall cognitive status: Within functional limits for tasks assessed     SENSATION: WFL  POSTURE: Forward head, rounded shoulders   Cervical A/PROM:    08/04/2022      Flexion WFL      Extension WFL*      R ROT  50% limited*     L ROT  50% limited *     R SB 25% limited *     L SB WFL*     * tightness/pull   (Blank rows = not tested)   UE Measurements Upper Extremity Right 08/04/2022 Left 08/04/2022   A/PROM MMT A/PROM MMT  Shoulder Flexion 155 4+ 155 4  Shoulder Extension      Shoulder Abduction WFL 4+ WFL 4  Shoulder Adduction      Shoulder Internal Rotation Reaches to T3SP 4 Reaches to T3S 4  Shoulder External Rotation T.8 SP 4 T8 SP* 4-  Elbow Flexion      Elbow Extension      Wrist Flexion      Wrist Extension       Wrist Supination      Wrist Pronation      Wrist Ulnar Deviation      Wrist Radial Deviation      Grip Strength NA  NA     (Blank rows = not tested)   * pain Right handed   JOINT MOBILITY TESTING:  Hypomobility in left shoulder in all directions  PALPATION:  Tenderness to palpation along left biceps, deltoid and pec   TODAY'S TREATMENT:                                                                                                                                         DATE: 08/04/2022  Therapeutic Exercise:  Aerobic: Supine: shoulder flexion with dowel --> with blue band x10 5" holds B,  Prone:scapular protraction x15 5" holds B  Seated:  Standing: shoulder extension with dowel - x15 5" holds, pec stretch x5 15" holds B Neuromuscular Re-education: Manual Therapy: Therapeutic Activity: Self Care: Trigger Point Dry Needling:  Modalities:     PATIENT EDUCATION:  Education details: on current presentation, on HEP, on clinical outcomes score and  POC Person educated: Patient Education method: Explanation, Demonstration, and Handouts Education comprehension: verbalized understanding   HOME EXERCISE PROGRAM: [id number]  ASSESSMENT:  CLINICAL IMPRESSION: Patient presents with left shoulder pain that started in May of this year. Patient presents with limitations in ROM, strength and mobility of left UE that is impairing his daily function. Patient would greatly benefit from skilled PT to improve overall limitations and improve QOL,  OBJECTIVE IMPAIRMENTS: decreased ROM, decreased strength, impaired UE functional use, postural dysfunction, and pain.   ACTIVITY LIMITATIONS: lifting, bathing, dressing, and reach over head  PARTICIPATION LIMITATIONS: cleaning and playing guitar   PERSONAL FACTORS: Fitness are also affecting patient's functional outcome.   REHAB POTENTIAL: Excellent  CLINICAL DECISION MAKING: Stable/uncomplicated  EVALUATION COMPLEXITY:  Low  GOALS: Goals reviewed with patient? yes  SHORT TERM GOALS: Target date: 09/01/2022  Patient will be independent in self management strategies to improve quality of life and functional outcomes. Baseline: New Program Goal status: INITIAL  2.  Patient will report at least 50% improvement in overall symptoms and/or function to demonstrate improved functional mobility Baseline: 0% better Goal status: INITIAL  3.  Patient will be able to demonstrate painfree shoulder ROM to improve ability to reach overhead. Baseline:  Goal status: INITIAL      LONG TERM GOALS: Target date: 09/29/2022   Patient will report at least 75% improvement in overall symptoms and/or function to demonstrate improved functional mobility Baseline: 0% better Goal status: INITIAL  2.  Patient will be able to sleep without pain in left shoulder to improve sleep quality.  Baseline:  Goal status: INITIAL  3.  Patient will be able to play guitar  Baseline:  Goal status: INITIAL      PLAN:  PT FREQUENCY: 1-2x/week for a total of 8 visits over 8 week certification  PT DURATION: 8 weeks  PLANNED INTERVENTIONS: Therapeutic exercises, Therapeutic activity, Neuromuscular re-education, Balance training, Gait training, Patient/Family education, Self Care, Joint mobilization, Joint manipulation, Vestibular training, Canalith repositioning, Aquatic Therapy, Dry Needling, Electrical stimulation, Spinal manipulation, Spinal mobilization, Cryotherapy, Moist heat, Vasopneumatic device, Traction, Ultrasound, Ionotophoresis '4mg'$ /ml Dexamethasone, Manual therapy, and Re-evaluation  PLAN FOR NEXT SESSION: shoulder ROM, manual as needed, MMT to left shoulder, thoracic and cervical mobility   3:38 PM, 08/04/22 Jerene Pitch, DPT Physical Therapy with [facility]

## 2022-08-01 ENCOUNTER — Ambulatory Visit
Admission: RE | Admit: 2022-08-01 | Discharge: 2022-08-01 | Disposition: A | Discharge status: Home / Self Care | Payer: Medicare Other | Source: Ambulatory Visit | Attending: Physician Assistant | Admitting: Physician Assistant

## 2022-08-01 DIAGNOSIS — M5416 Radiculopathy, lumbar region: Secondary | ICD-10-CM

## 2022-08-01 MED ORDER — METHYLPREDNISOLONE ACETATE 40 MG/ML INJ SUSP (RADIOLOG
80.0000 mg | Freq: Once | INTRAMUSCULAR | Status: AC
  Administered 2022-08-01: 80 mg via EPIDURAL

## 2022-08-01 MED ORDER — IOPAMIDOL (ISOVUE-M 200) INJECTION 41%
1.0000 mL | Freq: Once | INTRAMUSCULAR | Status: AC
  Administered 2022-08-01: 1 mL via EPIDURAL

## 2022-08-01 NOTE — Discharge Instructions (Signed)

## 2022-08-04 ENCOUNTER — Encounter: Payer: Self-pay | Admitting: Physical Therapy

## 2022-08-04 ENCOUNTER — Ambulatory Visit: Payer: Medicare Other | Admitting: Physical Therapy

## 2022-08-04 ENCOUNTER — Ambulatory Visit (INDEPENDENT_AMBULATORY_CARE_PROVIDER_SITE_OTHER): Payer: Medicare Other | Admitting: Physical Therapy

## 2022-08-04 DIAGNOSIS — M6281 Muscle weakness (generalized): Secondary | ICD-10-CM | POA: Diagnosis not present

## 2022-08-04 DIAGNOSIS — M25512 Pain in left shoulder: Secondary | ICD-10-CM

## 2022-08-11 ENCOUNTER — Encounter: Payer: Self-pay | Admitting: Physical Therapy

## 2022-08-11 ENCOUNTER — Ambulatory Visit (INDEPENDENT_AMBULATORY_CARE_PROVIDER_SITE_OTHER): Payer: Medicare Other | Admitting: Physical Therapy

## 2022-08-11 DIAGNOSIS — M25512 Pain in left shoulder: Secondary | ICD-10-CM

## 2022-08-11 DIAGNOSIS — M6281 Muscle weakness (generalized): Secondary | ICD-10-CM | POA: Diagnosis not present

## 2022-08-11 NOTE — Therapy (Addendum)
OUTPATIENT PHYSICAL THERAPY TREATMENT NOTE   Patient Name: Andrew Benitez MRN: [id number] DOB:04/30/1951, 71 y.o., male Today's Date: 08/11/2022  PCP: Inda Coke, PA REFERRING PROVIDER: Inda Coke, PA  END OF SESSION:   PT End of Session - 08/11/22 1433     Visit Number 2    Number of Visits 8    Date for PT Re-Evaluation 09/29/22    Authorization Type medicare    Progress Note Due on Visit 10    PT Start Time 2919    PT Stop Time 1660    PT Time Calculation (min) 38 min    Activity Tolerance Patient tolerated treatment well;No increased pain             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)   Past Surgical History:  Procedure Laterality Date   BREAST SURGERY  02/24/1981   Gynecomastia; date is an estimate   [place]  2018   MASTECTOMY FOR GYNECOMASTIA Left 1983   SKIN CANCER EXCISION  2018   right side of neck and left forehead   TONSILLECTOMY     Patient Active Problem List   Diagnosis Date Noted   Cubital tunnel syndrome, 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   Lumbar radiculopathy 03/26/2022   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   Macular RPE mottling 11/13/2021   Nonexudative age-related macular degeneration, left eye, 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   Macular degeneration of both eyes 09/20/2021   Low back pain 09/19/2021   Depression       REFERRING DIAG: S46.012D (ICD-10-CM) - Strain of left rotator cuff capsule, subsequent encounter   THERAPY DIAG:  Acute pain of left shoulder    Muscle weakness (generalized)    Rationale for Evaluation and Treatment: Rehabilitation   ONSET DATE: May  2023    SUBJECTIVE:                                                                                                                                                                             SUBJECTIVE  STATEMENT: 08/11/2022 States that he has done his exercises regularly. States that he has pain with punch exercise so reduced force of the reach and that helped.   Eval: States that he has been having left shoulder pain over the last few months. States laying on his left shoulder he feels it and at various time during the day and when he reaches behind to reach behind his back. States that he was also having numbness in his right two fingers but that is a  little bit better. He is wanting to be treated for his left shoulder pain.       PERTINENT HISTORY: benign enlarged prostate with baseline urgency, no other changes in bowel/bladder.    PAIN:  Are you having pain? Yes: NPRS scale: 1/10 Pain location: left shoulder  Pain description: awarness Aggravating factors: laying on it, palpation, reaching behind back Relieving factors: rest   PRECAUTIONS: None   WEIGHT BEARING RESTRICTIONS: No   FALLS:  Has patient fallen in last 6 months? No       PLOF: Independent   PATIENT GOALS:to have less pain and have HEP to work on for shoulder at home   NEXT MD VISIT:    OBJECTIVE:    DIAGNOSTIC FINDINGS:  02/24/22 cervical spine xray IMPRESSION: 1. No evidence for fracture or malalignment. 2. Mild degenerative changes.       COGNITION: Overall cognitive status: Within functional limits for tasks assessed                                  SENSATION: WFL   POSTURE: Forward head, rounded shoulders              Cervical A/PROM:    08/04/2022      Flexion WFL      Extension WFL*      R ROT  50% limited*     L ROT  50% limited *     R SB 25% limited *     L SB WFL*                          * tightness/pull                     (Blank rows = not tested)              UE Measurements       Upper Extremity Right 08/04/2022 Left 08/04/2022    A/PROM MMT A/PROM MMT  Shoulder Flexion 155 4+ 155 4  Shoulder Extension          Shoulder Abduction WFL 4+ WFL 4  Shoulder Adduction           Shoulder Internal Rotation Reaches to T3SP 4 Reaches to T3S 4  Shoulder External Rotation T.8 SP 4 T8 SP* 4-  Elbow Flexion          Elbow Extension          Wrist Flexion          Wrist Extension          Wrist Supination          Wrist Pronation          Wrist Ulnar Deviation          Wrist Radial Deviation          Grip Strength NA   NA                          (Blank rows = not tested)                       * pain Right handed     JOINT MOBILITY TESTING:  Hypomobility in left shoulder in all directions   PALPATION:  Tenderness to palpation along left biceps, deltoid and pec  TODAY'S TREATMENT:                                                                                                                                         DATE: 08/11/2022 Therapeutic Exercise:           Aerobic: Supine: shoulder flexion  with red band 2x10 5" holds B, horizontal shoulder abd RTB 3x5, scapular depression and retraction x15 5" holds - tolerated well  Prone:            Seated: sitting on sit bones posture           Standing:  Neuromuscular Re-education: Manual Therapy: STM to left pec, AP to left GHJ tolerated well - with and without motion Therapeutic Activity: Self Care: Trigger Point Dry Needling:  Modalities:        PATIENT EDUCATION:  Education details: on HEP and rationale for interventions,ergonomic set up Person educated: Patient Education method: Explanation, Demonstration, and Handouts Education comprehension: verbalized understanding     HOME EXERCISE PROGRAM: [id number]   ASSESSMENT:   CLINICAL IMPRESSION: 08/11/2022 Progressed exercises as tolerated. Answered all questions and reviewed previous exercises. Tolerated manual well, slight awareness in shoulder noted end of session but no pain. Will continue with current POC as tolerated   Eval: Patient presents with left shoulder pain that started in May of this year. Patient presents with  limitations in ROM, strength and mobility of left UE that is impairing his daily function. Patient would greatly benefit from skilled PT to improve overall limitations and improve QOL,   OBJECTIVE IMPAIRMENTS: decreased ROM, decreased strength, impaired UE functional use, postural dysfunction, and pain.    ACTIVITY LIMITATIONS: lifting, bathing, dressing, and reach over head   PARTICIPATION LIMITATIONS: cleaning and playing guitar    PERSONAL FACTORS: Fitness are also affecting patient's functional outcome.    REHAB POTENTIAL: Excellent   CLINICAL DECISION MAKING: Stable/uncomplicated   EVALUATION COMPLEXITY: Low   GOALS: Goals reviewed with patient? yes   SHORT TERM GOALS: Target date: 09/01/2022  Patient will be independent in self management strategies to improve quality of life and functional outcomes. Baseline: New Program Goal status: INITIAL   2.  Patient will report at least 50% improvement in overall symptoms and/or function to demonstrate improved functional mobility Baseline: 0% better Goal status: INITIAL   3.  Patient will be able to demonstrate painfree shoulder ROM to improve ability to reach overhead. Baseline:  Goal status: INITIAL         LONG TERM GOALS: Target date: 09/29/2022    Patient will report at least 75% improvement in overall symptoms and/or function to demonstrate improved functional mobility Baseline: 0% better Goal status: INITIAL   2.  Patient will be able to sleep without pain in left shoulder to improve sleep quality.  Baseline:  Goal status: INITIAL   3.  Patient will be able to play guitar  Baseline:  Goal status: INITIAL         PLAN:   PT FREQUENCY: 1-2x/week for a total of 8 visits over 8 week certification   PT DURATION: 8 weeks   PLANNED INTERVENTIONS: Therapeutic exercises, Therapeutic activity, Neuromuscular re-education, Balance training, Gait training, Patient/Family education, Self Care, Joint mobilization, Joint  manipulation, Vestibular training, Canalith repositioning, Aquatic Therapy, Dry Needling, Electrical stimulation, Spinal manipulation, Spinal mobilization, Cryotherapy, Moist heat, Vasopneumatic device, Traction, Ultrasound, Ionotophoresis '4mg'$ /ml Dexamethasone, Manual therapy, and Re-evaluation   PLAN FOR NEXT SESSION: shoulder ROM, manual as needed, MMT to left shoulder, thoracic and cervical mobility, posture seated and standing, pelvic tilts   3:45 PM, 08/11/22 Jerene Pitch, DPT Physical Therapy with [facility]

## 2022-08-20 ENCOUNTER — Encounter: Payer: Self-pay | Admitting: Physician Assistant

## 2022-08-20 ENCOUNTER — Ambulatory Visit (INDEPENDENT_AMBULATORY_CARE_PROVIDER_SITE_OTHER): Payer: Medicare Other | Admitting: Physician Assistant

## 2022-08-20 VITALS — BP 106/66 | HR 65 | Temp 97.5°F | Ht 71.0 in | Wt 157.4 lb

## 2022-08-20 DIAGNOSIS — R051 Acute cough: Secondary | ICD-10-CM | POA: Diagnosis not present

## 2022-08-20 MED ORDER — FINASTERIDE 1 MG PO TABS: 1.0000 mg | ORAL_TABLET | Freq: Every day | ORAL | 3 refills | Status: DC

## 2022-08-20 MED ORDER — IPRATROPIUM BROMIDE 0.03 % NA SOLN: 2.0000 | Freq: Two times a day (BID) | NASAL | 12 refills | Status: DC

## 2022-08-20 MED ORDER — BENZONATATE 100 MG PO CAPS: 100.0000 mg | ORAL_CAPSULE | Freq: Three times a day (TID) | ORAL | 0 refills | Status: DC | PRN

## 2022-08-20 NOTE — Progress Notes (Signed)
Andrew Benitez is a 71 y.o. male here for a new problem.  History of Present Illness:   Chief Complaint  Patient presents with   Cough    Home Covid test negative on 08/19/2022    HPI   Cough Patient reports that he has been experiencing a cough and tingle in his throat. He states that he has difficulty sleeping because of his cough and has to sleep in a recliner. He explains that he was at an elementary school book fair 2 and a half weeks ago and suspects that is where he contracted his illness from. Patient originally had a sore throat and nasal congestion, but both have resolved. He confirms post nasal drip and expresses that its worse when lying down. Patient reports that he is eating and drinking regularly. He manages his symptoms with Mucinex with relief.   He denies fever, chills, body aches, change in appetite, diarrhea, and ear pressure.    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)     Social History   Tobacco Use   Smoking status: Never   Smokeless tobacco: Never  Vaping Use   Vaping Use: Never used  Substance Use Topics   Alcohol use: Yes    Alcohol/week: 1.0 standard drink of alcohol    Types: 1 Cans of beer per week    Comment: one beer every  week   Drug use: Yes    Frequency: 2.0 times per week    Types: Marijuana    Comment: once a week    Past Surgical History:  Procedure Laterality Date   BREAST SURGERY  02/24/1981   Gynecomastia; date is an estimate   CYSTOSCOPY WITH INSERTION OF UROLIFT  2018   MASTECTOMY FOR GYNECOMASTIA Left 1983   SKIN CANCER EXCISION  2018   right side of neck and left forehead   TONSILLECTOMY      Family History  Problem Relation Age of Onset   Alcohol abuse Mother    Arthritis Mother    COPD Mother    Vision loss Mother    Alcohol abuse Father    Depression Father    Early death Father     Breast cancer Sister    Alcohol abuse Sister    Depression Sister    Asthma Sister    Hyperlipidemia Sister    Heart attack Paternal Grandmother     No Known Allergies  Current Medications:   Current Outpatient Medications:    ARIPiprazole (ABILIFY) 5 MG tablet, Take 0.5 tablets (2.5 mg total) by mouth daily., Disp: 45 tablet, Rfl: 1   buPROPion (WELLBUTRIN XL) 300 MG 24 hr tablet, Take 1 tablet (300 mg total) by mouth daily., Disp: 90 tablet, Rfl: 3   DULoxetine (CYMBALTA) 30 MG capsule, Take 1 capsule (30 mg total) by mouth daily., Disp: 90 capsule, Rfl: 3   ferrous sulfate 325 (65 FE) MG tablet, Take 325 mg by mouth every other day., Disp: , Rfl:    FIBER PO, Take 1 each by mouth in the morning and at bedtime. Tablespoon, Disp: , Rfl:    finasteride (PROPECIA) 1 MG tablet, Take 1 tablet (1 mg total) by mouth daily., Disp: 90 tablet, Rfl: 3   Multiple Vitamins-Minerals (PRESERVISION AREDS 2) CAPS, Take 1 capsule by mouth 2 (two) times daily before a meal. With meals breakfast  and dinner, Disp: , Rfl:    vitamin C (ASCORBIC ACID) 500 MG tablet, Take 500 mg by mouth daily., Disp: , Rfl:    Review of Systems:   Review of Systems  Constitutional:  Negative for chills and fever.  HENT:  Negative for ear pain.   Respiratory:  Positive for cough.   Gastrointestinal:  Negative for diarrhea.    Vitals:   Vitals:   08/20/22 1314  BP: 106/66  Pulse: 65  Temp: (!) 97.5 F (36.4 C)  TempSrc: Temporal  SpO2: 97%  Weight: 157 lb 6.4 oz (71.4 kg)  Height: '5\' 11"'$  (1.803 m)     Body mass index is 21.95 kg/m.  Physical Exam:   Physical Exam Constitutional:      General: He is not in acute distress.    Appearance: Normal appearance. He is not ill-appearing.  HENT:     Head: Normocephalic and atraumatic.     Right Ear: External ear normal.     Left Ear: External ear normal.  Eyes:     Extraocular Movements: Extraocular movements intact.     Pupils: Pupils are equal, round,  and reactive to light.  Cardiovascular:     Rate and Rhythm: Normal rate and regular rhythm.     Heart sounds: Normal heart sounds. No murmur heard.    No gallop.  Pulmonary:     Effort: Pulmonary effort is normal. No respiratory distress.     Breath sounds: Normal breath sounds. No wheezing or rales.  Skin:    General: Skin is warm and dry.  Neurological:     Mental Status: He is alert and oriented to person, place, and time.  Psychiatric:        Judgment: Judgment normal.     Assessment and Plan:   Acute cough No red flags on exam Suspect post-viral cough Start atrovent nasal spray Recommend dextromethorphan (Delsym is brand name) for cough Trial tessalon perles Follow-up if new/worsening sx  I,Verona Buck,acting as a scribe for [employer], PA.,have documented all relevant documentation on the behalf of Inda Coke, PA,as directed by  Inda Coke, PA while in the presence of Inda Coke, [place].  I, Inda Coke, [place], have reviewed all documentation for this visit. The documentation on 08/20/22 for the exam, diagnosis, procedures, and orders are all accurate and complete.  Inda Coke, PA-C

## 2022-08-20 NOTE — Patient Instructions (Signed)
It was great to see you!  Start atrovent nasal spray  Recommend dextromethorphan (Delsym is brand name) for cough  Trial tessalon perles  Keep me posted!

## 2022-08-25 ENCOUNTER — Encounter: Payer: Self-pay | Admitting: Physical Therapy

## 2022-08-25 ENCOUNTER — Encounter: Payer: Self-pay | Admitting: Physician Assistant

## 2022-08-25 ENCOUNTER — Ambulatory Visit (INDEPENDENT_AMBULATORY_CARE_PROVIDER_SITE_OTHER): Payer: Medicare Other | Admitting: Physical Therapy

## 2022-08-25 DIAGNOSIS — M6281 Muscle weakness (generalized): Secondary | ICD-10-CM

## 2022-08-25 DIAGNOSIS — M25512 Pain in left shoulder: Secondary | ICD-10-CM

## 2022-08-25 NOTE — Therapy (Signed)
OUTPATIENT PHYSICAL THERAPY TREATMENT NOTE   Patient Name: Andrew Benitez MRN: [id number] DOB:[birth year], 71 y.o., male Today's Date: 08/25/2022  PCP: Inda Coke, PA REFERRING PROVIDER: Inda Coke, PA  END OF SESSION:   PT End of Session - 08/25/22 1303     Visit Number 3    Number of Visits 8    Date for PT Re-Evaluation 09/29/22    Authorization Type medicare    Progress Note Due on Visit 10    PT Start Time 8366    PT Stop Time 1346    PT Time Calculation (min) 42 min    Activity Tolerance Patient tolerated treatment well;No increased pain             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)   Past Surgical History:  Procedure Laterality Date   BREAST SURGERY  02/24/1981   Gynecomastia; date is an estimate   CYSTOSCOPY WITH INSERTION OF UROLIFT  2018   MASTECTOMY FOR GYNECOMASTIA Left 1983   SKIN CANCER EXCISION  2018   right side of neck and left forehead   TONSILLECTOMY     Patient Active Problem List   Diagnosis Date Noted   Cubital tunnel syndrome, 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   Lumbar radiculopathy 03/26/2022   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   Macular RPE mottling 11/13/2021   Nonexudative age-related macular degeneration, left eye, 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   Macular degeneration of both eyes 09/20/2021   Low back pain 09/19/2021   Depression       REFERRING DIAG: S46.012D (ICD-10-CM) - Strain of left rotator cuff capsule, subsequent encounter   THERAPY DIAG:  Acute pain of left shoulder    Muscle weakness (generalized)    Rationale for Evaluation and Treatment: Rehabilitation   ONSET DATE: May  2023    SUBJECTIVE:                                                                                                                                                                             SUBJECTIVE  STATEMENT: 08/25/2022 States he was having increased pain reaching behind his back and so he reduced his number of sets  Eval: States that he has been having left shoulder pain over the last few months. States laying on his left shoulder he feels it and at various time during the day and when he reaches behind to reach behind his back. States that he was also having numbness in his right two fingers but that is a little bit better. He is wanting to be  treated for his left shoulder pain.       PERTINENT HISTORY: benign enlarged prostate with baseline urgency, no other changes in bowel/bladder.    PAIN:  Are you having pain? Yes: NPRS scale: 0/10 Pain location: left shoulder  Pain description: awarness Aggravating factors: laying on it, palpation, reaching behind back Relieving factors: rest   PRECAUTIONS: None   WEIGHT BEARING RESTRICTIONS: No   FALLS:  Has patient fallen in last 6 months? No       PLOF: Independent   PATIENT GOALS:to have less pain and have HEP to work on for shoulder at home   NEXT MD VISIT:    OBJECTIVE:    DIAGNOSTIC FINDINGS:  02/24/22 cervical spine xray IMPRESSION: 1. No evidence for fracture or malalignment. 2. Mild degenerative changes.       COGNITION: Overall cognitive status: Within functional limits for tasks assessed                                  SENSATION: WFL   POSTURE: Forward head, rounded shoulders              Cervical A/PROM:    08/04/2022      Flexion WFL      Extension WFL*      R ROT  50% limited*     L ROT  50% limited *     R SB 25% limited *     L SB WFL*                          * tightness/pull                     (Blank rows = not tested)              UE Measurements       Upper Extremity Right 08/04/2022 Left 08/04/2022    A/PROM MMT A/PROM MMT  Shoulder Flexion 155 4+ 155 4  Shoulder Extension          Shoulder Abduction WFL 4+ WFL 4  Shoulder Adduction          Shoulder Internal Rotation  Reaches to T3SP 4 Reaches to T3S 4  Shoulder External Rotation T.8 SP 4 T8 SP* 4-  Elbow Flexion          Elbow Extension          Wrist Flexion          Wrist Extension          Wrist Supination          Wrist Pronation          Wrist Ulnar Deviation          Wrist Radial Deviation          Grip Strength NA   NA                          (Blank rows = not tested)                       * pain Right handed     JOINT MOBILITY TESTING:  Hypomobility in left shoulder in all directions   PALPATION:  Tenderness to palpation along left biceps, deltoid and pec  TODAY'S TREATMENT:                                                                                                                                         DATE: 08/25/2022 Therapeutic Exercise:           Aerobic: Supine: shoulder flexion x15 5" holds, towel roll down spine - 6 minutes, hug on towel roll with roll at arm pit left 6 minutes  S/l:shoulder ER L x10 5" holds --> with 2# weights 2x10 5" holds L            Seated:             Standing:  Neuromuscular Re-education: Manual Therapy: STM to left pec, scapular mobilizations with and without shoulder ROM - tolerated well -left arm Therapeutic Activity: Self Care: Trigger Point Dry Needling:  Modalities:        PATIENT EDUCATION:  Education details: on HEP  Person educated: Patient Education method: Explanation, Demonstration, and Handouts Education comprehension: verbalized understanding     HOME EXERCISE PROGRAM: [id number]   ASSESSMENT:   CLINICAL IMPRESSION: 08/25/2022 Continued to progress exercises. Movements with shoulder moving anteriorly continue to provoke pain but is reduced when this movement is controlled for . Added shoulder ER to HEP with towel for form. Instructed patient to perform all exercises in pain free ROM. Will continue with current POC as tolerated.    Eval: Patient presents with left shoulder pain that started in May of this  year. Patient presents with limitations in ROM, strength and mobility of left UE that is impairing his daily function. Patient would greatly benefit from skilled PT to improve overall limitations and improve QOL,   OBJECTIVE IMPAIRMENTS: decreased ROM, decreased strength, impaired UE functional use, postural dysfunction, and pain.    ACTIVITY LIMITATIONS: lifting, bathing, dressing, and reach over head   PARTICIPATION LIMITATIONS: cleaning and playing guitar    PERSONAL FACTORS: Fitness are also affecting patient's functional outcome.    REHAB POTENTIAL: Excellent   CLINICAL DECISION MAKING: Stable/uncomplicated   EVALUATION COMPLEXITY: Low   GOALS: Goals reviewed with patient? yes   SHORT TERM GOALS: Target date: 09/01/2022  Patient will be independent in self management strategies to improve quality of life and functional outcomes. Baseline: New Program Goal status: INITIAL   2.  Patient will report at least 50% improvement in overall symptoms and/or function to demonstrate improved functional mobility Baseline: 0% better Goal status: INITIAL   3.  Patient will be able to demonstrate painfree shoulder ROM to improve ability to reach overhead. Baseline:  Goal status: INITIAL         LONG TERM GOALS: Target date: 09/29/2022    Patient will report at least 75% improvement in overall symptoms and/or function to demonstrate improved functional mobility Baseline: 0% better Goal status: INITIAL   2.  Patient will be able to sleep without pain in  left shoulder to improve sleep quality.  Baseline:  Goal status: INITIAL   3.  Patient will be able to play guitar  Baseline:  Goal status: INITIAL         PLAN:   PT FREQUENCY: 1-2x/week for a total of 8 visits over 8 week certification   PT DURATION: 8 weeks   PLANNED INTERVENTIONS: Therapeutic exercises, Therapeutic activity, Neuromuscular re-education, Balance training, Gait training, Patient/Family education, Self  Care, Joint mobilization, Joint manipulation, Vestibular training, Canalith repositioning, Aquatic Therapy, Dry Needling, Electrical stimulation, Spinal manipulation, Spinal mobilization, Cryotherapy, Moist heat, Vasopneumatic device, Traction, Ultrasound, Ionotophoresis '4mg'$ /ml Dexamethasone, Manual therapy, and Re-evaluation   PLAN FOR NEXT SESSION: shoulder ROM, manual as needed, MMT to left shoulder, thoracic and cervical mobility, posture seated and standing, pelvic tilts   1:49 PM, 08/25/22 Jerene Pitch, DPT Physical Therapy with [facility]

## 2022-08-29 ENCOUNTER — Encounter: Payer: Self-pay | Admitting: Physician Assistant

## 2022-09-01 ENCOUNTER — Encounter: Payer: Self-pay | Admitting: Physical Therapy

## 2022-09-01 ENCOUNTER — Ambulatory Visit (INDEPENDENT_AMBULATORY_CARE_PROVIDER_SITE_OTHER): Payer: Medicare Other | Admitting: Physical Therapy

## 2022-09-01 DIAGNOSIS — M25512 Pain in left shoulder: Secondary | ICD-10-CM | POA: Diagnosis not present

## 2022-09-01 DIAGNOSIS — M6281 Muscle weakness (generalized): Secondary | ICD-10-CM

## 2022-09-01 NOTE — Therapy (Signed)
OUTPATIENT PHYSICAL THERAPY TREATMENT NOTE   Patient Name: Andrew Benitez MRN: [id number] DOB:1951/02/16, 71 y.o., male Today's Date: 09/01/2022  PCP: Inda Coke, PA REFERRING PROVIDER: Inda Coke, PA  END OF SESSION:   PT End of Session - 09/01/22 1303     Visit Number 4    Number of Visits 8    Date for PT Re-Evaluation 09/29/22    Authorization Type medicare    Progress Note Due on Visit 10    PT Start Time 1308    PT Stop Time 1344    PT Time Calculation (min) 41 min    Activity Tolerance Patient tolerated treatment well;No increased pain             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)   Past Surgical History:  Procedure Laterality Date   BREAST SURGERY  02/24/1981   Gynecomastia; date is an estimate   CYSTOSCOPY WITH INSERTION OF UROLIFT  2018   MASTECTOMY FOR GYNECOMASTIA Left 1983   SKIN CANCER EXCISION  2018   right side of neck and left forehead   TONSILLECTOMY     Patient Active Problem List   Diagnosis Date Noted   Cubital tunnel syndrome, 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   Lumbar radiculopathy 03/26/2022   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   Macular RPE mottling 11/13/2021   Nonexudative age-related macular degeneration, left eye, 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   Macular degeneration of both eyes 09/20/2021   Low back pain 09/19/2021   Depression       REFERRING DIAG: S46.012D (ICD-10-CM) - Strain of left rotator cuff capsule, subsequent encounter   THERAPY DIAG:  Acute pain of left shoulder    Muscle weakness (generalized)    Rationale for Evaluation and Treatment: Rehabilitation   ONSET DATE: May  2023    SUBJECTIVE:                                                                                                                                                                             SUBJECTIVE  STATEMENT: 09/01/2022 States that he has been doing exercises still having pain and feels about the same but feels motion has improved. States he feels about 10% better.  Eval: States that he has been having left shoulder pain over the last few months. States laying on his left shoulder he feels it and at various time during the day and when he reaches behind to reach behind his back. States that he was also having numbness in his right two fingers but that is a  little bit better. He is wanting to be treated for his left shoulder pain.       PERTINENT HISTORY: benign enlarged prostate with baseline urgency, no other changes in bowel/bladder.    PAIN:  Are you having pain? Yes: NPRS scale: 0/10 Pain location: left shoulder  Pain description: awarness Aggravating factors: laying on it, palpation, reaching behind back Relieving factors: rest   PRECAUTIONS: None   WEIGHT BEARING RESTRICTIONS: No   FALLS:  Has patient fallen in last 6 months? No       PLOF: Independent   PATIENT GOALS:to have less pain and have HEP to work on for shoulder at home   NEXT MD VISIT:    OBJECTIVE:    DIAGNOSTIC FINDINGS:  02/24/22 cervical spine xray IMPRESSION: 1. No evidence for fracture or malalignment. 2. Mild degenerative changes.               Cervical A/PROM:    09/01/2022      Flexion WFL      Extension WFL*      R ROT  60% limited*     L ROT  60% limited *     R SB WFL *     L SB WFL*                          * tightness/pull                     (Blank rows = not tested)              UE Measurements       Upper Extremity Right 09/01/2022 Left 09/01/2022    A/PROM MMT A/PROM MMT  Shoulder Flexion 160 4+ 160 4+  Shoulder Extension          Shoulder Abduction WFL 4* WFL 4+  Shoulder Adduction          Shoulder Internal Rotation Reaches to T7SP 4+ Reaches to T7SP* 4+  Shoulder External Rotation T3 SP 4+ T3 SP* 4+  Elbow Flexion          Elbow Extension          Wrist  Flexion          Wrist Extension          Wrist Supination          Wrist Pronation          Wrist Ulnar Deviation          Wrist Radial Deviation          Grip Strength NA   NA                          (Blank rows = not tested)                       * pain Right handed                  TODAY'S TREATMENT:  DATE: 09/01/2022 Therapeutic Exercise:           Aerobic: Supine:  S/l:            Seated:             Standing:  Neuromuscular Re-education:on bladder retraining, reducing external stimuli linked with going - 15 minutes total, sleep positioning with and without pillows/towel rolls in semi s/l position 15 minutes  Manual Therapy:   Therapeutic Activity: Self Care:  Trigger Point Dry Needling:  Modalities:        PATIENT EDUCATION:  Education details: review of  HEP frequency and ROM/MMT goals/progress Person educated: Patient Education method: Explanation, Demonstration, and Handouts Education comprehension: verbalized understanding     HOME EXERCISE PROGRAM: [id number]   ASSESSMENT:   CLINICAL IMPRESSION: 09/01/2022 Session focused on bladder retraining secondary to getting up multiple times in one night, going to the bathroom proactively and having the urge with the sound of water. Disscussed importance of going to the bathroom with true urge but limiting/reducing amount of times he goes to the bathroom proactively to reduce urge frequency. Discussed and educated patient on sleeping postures and positions with pillows to reduce stress on shoulders. Overall patient is improving but contues to have pain. Will continue with current POC as tolerated.    Eval: Patient presents with left shoulder pain that started in May of this year. Patient presents with limitations in ROM, strength and mobility of left UE that is impairing his daily  function. Patient would greatly benefit from skilled PT to improve overall limitations and improve QOL,   OBJECTIVE IMPAIRMENTS: decreased ROM, decreased strength, impaired UE functional use, postural dysfunction, and pain.    ACTIVITY LIMITATIONS: lifting, bathing, dressing, and reach over head   PARTICIPATION LIMITATIONS: cleaning and playing guitar    PERSONAL FACTORS: Fitness are also affecting patient's functional outcome.    REHAB POTENTIAL: Excellent   CLINICAL DECISION MAKING: Stable/uncomplicated   EVALUATION COMPLEXITY: Low   GOALS: Goals reviewed with patient? yes   SHORT TERM GOALS: Target date: 09/01/2022  Patient will be independent in self management strategies to improve quality of life and functional outcomes. Baseline: New Program Goal status: MET   2.  Patient will report at least 50% improvement in overall symptoms and/or function to demonstrate improved functional mobility Baseline: 0% better PROGRESSING    3.  Patient will be able to demonstrate painfree shoulder ROM to improve ability to reach overhead. Baseline:  Goal status: PROGRESSING          LONG TERM GOALS: Target date: 09/29/2022    Patient will report at least 75% improvement in overall symptoms and/or function to demonstrate improved functional mobility Baseline: 0% better Goal status: PROGRESSING    2.  Patient will be able to sleep without pain in left shoulder to improve sleep quality.  Baseline:  Goal status: PROGRESSING    3.  Patient will be able to play guitar  Baseline:  Goal status: MET          PLAN:   PT FREQUENCY: 1-2x/week for a total of 8 visits over 8 week certification   PT DURATION: 8 weeks   PLANNED INTERVENTIONS: Therapeutic exercises, Therapeutic activity, Neuromuscular re-education, Balance training, Gait training, Patient/Family education, Self Care, Joint mobilization, Joint manipulation, Vestibular training, Canalith repositioning, Aquatic Therapy, Dry  Needling, Electrical stimulation, Spinal manipulation, Spinal mobilization, Cryotherapy, Moist heat, Vasopneumatic device, Traction, Ultrasound, Ionotophoresis 91m/ml Dexamethasone, Manual therapy, and Re-evaluation   PLAN FOR NEXT SESSION: cervical ROM/STM  shoulder ROM, manual as needed, MMT to left shoulder, thoracic and cervical mobility, posture seated and standing, pelvic tilts   2:53 PM, 09/01/22 Jerene Pitch, DPT Physical Therapy with [facility]

## 2022-09-10 ENCOUNTER — Encounter: Payer: Self-pay | Admitting: Physical Therapy

## 2022-09-10 ENCOUNTER — Ambulatory Visit (INDEPENDENT_AMBULATORY_CARE_PROVIDER_SITE_OTHER): Payer: Medicare Other | Admitting: Physical Therapy

## 2022-09-10 DIAGNOSIS — M6281 Muscle weakness (generalized): Secondary | ICD-10-CM | POA: Diagnosis not present

## 2022-09-10 DIAGNOSIS — M25512 Pain in left shoulder: Secondary | ICD-10-CM | POA: Diagnosis not present

## 2022-09-10 NOTE — Therapy (Signed)
OUTPATIENT PHYSICAL THERAPY TREATMENT NOTE   Patient Name: Andrew Benitez MRN: [id number] DOB:07-11-1951, 71 y.o., male Today's Date: 09/10/2022  PCP: Inda Coke, PA REFERRING PROVIDER: Inda Coke, PA  END OF SESSION:   PT End of Session - 09/10/22 1304     Visit Number 5    Number of Visits 8    Date for PT Re-Evaluation 09/29/22    Authorization Type medicare    Progress Note Due on Visit 10    PT Start Time 1304    PT Stop Time 1342    PT Time Calculation (min) 38 min    Activity Tolerance Patient tolerated treatment well;No increased pain             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)   Past Surgical History:  Procedure Laterality Date   BREAST SURGERY  02/24/1981   Gynecomastia; date is an estimate   CYSTOSCOPY WITH INSERTION OF UROLIFT  2018   MASTECTOMY FOR GYNECOMASTIA Left 1983   SKIN CANCER EXCISION  2018   right side of neck and left forehead   TONSILLECTOMY     Patient Active Problem List   Diagnosis Date Noted   Cubital tunnel syndrome, 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   Lumbar radiculopathy 03/26/2022   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   Macular RPE mottling 11/13/2021   Nonexudative age-related macular degeneration, left eye, 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   Macular degeneration of both eyes 09/20/2021   Low back pain 09/19/2021   Depression       REFERRING DIAG: S46.012D (ICD-10-CM) - Strain of left rotator cuff capsule, subsequent encounter   THERAPY DIAG:  Acute pain of left shoulder    Muscle weakness (generalized)    Rationale for Evaluation and Treatment: Rehabilitation   ONSET DATE: May  2023    SUBJECTIVE:                                                                                                                                                                             SUBJECTIVE  STATEMENT: 09/10/2022 States that he has been doing his stretches and has been trying different positional supports at night. Unsure if he is doing this right  Eval: States that he has been having left shoulder pain over the last few months. States laying on his left shoulder he feels it and at various time during the day and when he reaches behind to reach behind his back. States that he was also having numbness in his right two fingers but that is a little bit  better. He is wanting to be treated for his left shoulder pain.       PERTINENT HISTORY: benign enlarged prostate with baseline urgency, no other changes in bowel/bladder.    PAIN:  Are you having pain? Yes: NPRS scale: 1/10 Pain location: left shoulder  Pain description: awarness Aggravating factors: laying on it, palpation, reaching behind back Relieving factors: rest   PRECAUTIONS: None   WEIGHT BEARING RESTRICTIONS: No   FALLS:  Has patient fallen in last 6 months? No       PLOF: Independent   PATIENT GOALS:to have less pain and have HEP to work on for shoulder at home   NEXT MD VISIT:    OBJECTIVE:    DIAGNOSTIC FINDINGS:  02/24/22 cervical spine xray IMPRESSION: 1. No evidence for fracture or malalignment. 2. Mild degenerative changes.               Cervical A/PROM:    09/01/2022      Flexion WFL      Extension WFL*      R ROT  60% limited*     L ROT  60% limited *     R SB WFL *     L SB WFL*                          * tightness/pull                     (Blank rows = not tested)              UE Measurements       Upper Extremity Right 09/01/2022 Left 09/01/2022    A/PROM MMT A/PROM MMT  Shoulder Flexion 160 4+ 160 4+  Shoulder Extension          Shoulder Abduction WFL 4* WFL 4+  Shoulder Adduction          Shoulder Internal Rotation Reaches to T7SP 4+ Reaches to T7SP* 4+  Shoulder External Rotation T3 SP 4+ T3 SP* 4+  Elbow Flexion          Elbow Extension          Wrist Flexion           Wrist Extension          Wrist Supination          Wrist Pronation          Wrist Ulnar Deviation          Wrist Radial Deviation          Grip Strength NA   NA                          (Blank rows = not tested)                       * pain Right handed                  TODAY'S TREATMENT:  DATE: 09/10/2022 Therapeutic Exercise:           Aerobic: Supine:  S/l:            Seated:             Standing: shoulder flexion up wal with ER and protraction resistance into pillow case and wall - 10 minutes Neuromuscular Re-education:shoulder flexion with scapular movement - focus on upward motion of scapular - prior demonstration and tactile and verbal cues - 15 minutes, sleeping postures with pillow support - 10 minutes Manual Therapy:   Therapeutic Activity:  Self Care:  Trigger Point Dry Needling:  Modalities:        PATIENT EDUCATION:  Education details: review of  HEP, anatomy and rationale for interventions and scapular rhythm.  Person educated: Patient Education method: Explanation, Demonstration, and Handouts Education comprehension: verbalized understanding     HOME EXERCISE PROGRAM: [id number]   ASSESSMENT:   CLINICAL IMPRESSION: 09/10/2022 Focused on movement retraining on this date which was tolerated well. Reduced pain with roper scapula motion with shoulder flexion. Added new exercises to HEP. Reviewed sleeping posture. Answered all questions, will continue with current POC as tolerated.   Eval: Patient presents with left shoulder pain that started in May of this year. Patient presents with limitations in ROM, strength and mobility of left UE that is impairing his daily function. Patient would greatly benefit from skilled PT to improve overall limitations and improve QOL,   OBJECTIVE IMPAIRMENTS: decreased ROM, decreased strength,  impaired UE functional use, postural dysfunction, and pain.    ACTIVITY LIMITATIONS: lifting, bathing, dressing, and reach over head   PARTICIPATION LIMITATIONS: cleaning and playing guitar    PERSONAL FACTORS: Fitness are also affecting patient's functional outcome.    REHAB POTENTIAL: Excellent   CLINICAL DECISION MAKING: Stable/uncomplicated   EVALUATION COMPLEXITY: Low   GOALS: Goals reviewed with patient? yes   SHORT TERM GOALS: Target date: 09/01/2022  Patient will be independent in self management strategies to improve quality of life and functional outcomes. Baseline: New Program Goal status: MET   2.  Patient will report at least 50% improvement in overall symptoms and/or function to demonstrate improved functional mobility Baseline: 0% better PROGRESSING    3.  Patient will be able to demonstrate painfree shoulder ROM to improve ability to reach overhead. Baseline:  Goal status: PROGRESSING          LONG TERM GOALS: Target date: 09/29/2022    Patient will report at least 75% improvement in overall symptoms and/or function to demonstrate improved functional mobility Baseline: 0% better Goal status: PROGRESSING    2.  Patient will be able to sleep without pain in left shoulder to improve sleep quality.  Baseline:  Goal status: PROGRESSING    3.  Patient will be able to play guitar  Baseline:  Goal status: MET          PLAN:   PT FREQUENCY: 1-2x/week for a total of 8 visits over 8 week certification   PT DURATION: 8 weeks   PLANNED INTERVENTIONS: Therapeutic exercises, Therapeutic activity, Neuromuscular re-education, Balance training, Gait training, Patient/Family education, Self Care, Joint mobilization, Joint manipulation, Vestibular training, Canalith repositioning, Aquatic Therapy, Dry Needling, Electrical stimulation, Spinal manipulation, Spinal mobilization, Cryotherapy, Moist heat, Vasopneumatic device, Traction, Ultrasound, Ionotophoresis 26m/ml  Dexamethasone, Manual therapy, and Re-evaluation   PLAN FOR NEXT SESSION: cervical ROM/STM shoulder ROM, manual as needed, MMT to left shoulder, thoracic and cervical mobility, posture seated and standing, pelvic tilts   2:00  PM, 09/10/22 Jerene Pitch, DPT Physical Therapy with [facility]

## 2022-09-15 ENCOUNTER — Encounter: Payer: Self-pay | Admitting: Physician Assistant

## 2022-09-17 ENCOUNTER — Encounter: Payer: Self-pay | Admitting: Physical Therapy

## 2022-09-17 ENCOUNTER — Ambulatory Visit (INDEPENDENT_AMBULATORY_CARE_PROVIDER_SITE_OTHER): Payer: Medicare Other | Admitting: Physical Therapy

## 2022-09-17 DIAGNOSIS — M25512 Pain in left shoulder: Secondary | ICD-10-CM | POA: Diagnosis not present

## 2022-09-17 DIAGNOSIS — M6281 Muscle weakness (generalized): Secondary | ICD-10-CM

## 2022-09-17 NOTE — Therapy (Signed)
OUTPATIENT PHYSICAL THERAPY TREATMENT NOTE   Patient Name: Andrew Benitez MRN: [id number] DOB:[birth year], 72 y.o., male Today's Date: 09/17/2022  PCP: Inda Coke, PA REFERRING PROVIDER: Inda Coke, PA  END OF SESSION:   PT End of Session - 09/17/22 1303     Visit Number 6    Number of Visits 8    Date for PT Re-Evaluation 09/29/22    Authorization Type medicare    Progress Note Due on Visit 10    PT Start Time 7412    PT Stop Time 1342    PT Time Calculation (min) 38 min    Activity Tolerance Patient tolerated treatment well;No increased pain             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)   Past Surgical History:  Procedure Laterality Date   BREAST SURGERY  02/24/1981   Gynecomastia; date is an estimate   CYSTOSCOPY WITH INSERTION OF UROLIFT  2018   MASTECTOMY FOR GYNECOMASTIA Left 1983   SKIN CANCER EXCISION  2018   right side of neck and left forehead   TONSILLECTOMY     Patient Active Problem List   Diagnosis Date Noted   Cubital tunnel syndrome, 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   Lumbar radiculopathy 03/26/2022   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   Macular RPE mottling 11/13/2021   Nonexudative age-related macular degeneration, left eye, 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   Macular degeneration of both eyes 09/20/2021   Low back pain 09/19/2021   Depression       REFERRING DIAG: S46.012D (ICD-10-CM) - Strain of left rotator cuff capsule, subsequent encounter   THERAPY DIAG:  Acute pain of left shoulder    Muscle weakness (generalized)    Rationale for Evaluation and Treatment: Rehabilitation   ONSET DATE: May  2023    SUBJECTIVE:                                                                                                                                                                             SUBJECTIVE  STATEMENT: 09/17/2022 States that he has been doing his exercises but overall his low back has intensified and he has no current pain. States his shoulder is about the same- occasional twinge.  Overall feels about 10% better since the start of PT  Eval: States that he has been having left shoulder pain over the last few months. States laying on his left shoulder he feels it and at various time during the day and when he reaches behind to reach behind his back. States  that he was also having numbness in his right two fingers but that is a little bit better. He is wanting to be treated for his left shoulder pain.       PERTINENT HISTORY: benign enlarged prostate with baseline urgency, no other changes in bowel/bladder.    PAIN:  Are you having pain? Yes: NPRS scale: 0/10 Pain location: left shoulder  Pain description: awarness Aggravating factors: laying on it, palpation, reaching behind back Relieving factors: rest   PRECAUTIONS: None   WEIGHT BEARING RESTRICTIONS: No   FALLS:  Has patient fallen in last 6 months? No       PLOF: Independent   PATIENT GOALS:to have less pain and have HEP to work on for shoulder at home   NEXT MD VISIT:    OBJECTIVE:    DIAGNOSTIC FINDINGS:  02/24/22 cervical spine xray IMPRESSION: 1. No evidence for fracture or malalignment. 2. Mild degenerative changes.               Cervical A/PROM:    09/01/2022   09/17/22   Flexion WFL  WFL    Extension WFL*  WFL*    R ROT  60% limited*  50% limited*   L ROT  60% limited *  50% limited   R SB WFL *   WFL  L SB WFL*  WFL                        * tightness/pull                     (Blank rows = not tested)              UE Measurements       Upper Extremity Right 09/17/22 Left 09/17/22    A/PROM MMT A/PROM MMT  Shoulder Flexion 170 4+ 170 4+  Shoulder Extension          Shoulder Abduction WFL 4* WFL 4+  Shoulder Adduction          Shoulder Internal Rotation Reaches to T7SP 4+ Reaches to T7SP* 4+   Shoulder External Rotation T3 SP 4+ T3 SP 4+  Elbow Flexion          Elbow Extension          Wrist Flexion          Wrist Extension          Wrist Supination          Wrist Pronation          Wrist Ulnar Deviation          Wrist Radial Deviation          Grip Strength NA   NA                          (Blank rows = not tested)                       * pain Right handed                  TODAY'S TREATMENT:  DATE: 09/17/2022 Therapeutic Exercise:           Aerobic: Supine:  S/l:            Seated:  review of HEP           Standing: shoulder protraction at wall 5 minutes, shoulder walks red band x15,  Neuromuscular Re-education:shoulder flexion with scapular movement - focus on upward motion of scapular - prior demonstration and tactile and verbal cues - 10 Manual Therapy:   Therapeutic Activity:  Self Care:  Trigger Point Dry Needling:  Modalities:        PATIENT EDUCATION:  Education details: review of  HEP, anatomy and rationale for interventions and scapular rhythm. On benefits of aquatic THERAPY FOR LBP Person educated: Patient Education method: Explanation, Demonstration, and Handouts Education comprehension: verbalized understanding     HOME EXERCISE PROGRAM: [id number]   ASSESSMENT:   CLINICAL IMPRESSION: 09/17/2022 Focused on review of goals, progress and current presentation. Patient continues to have excessive left shoulder winging with rest and motion resulting an anterior shoulder pain. Educated patient on this and continued to focus on strengthening and stability. Monitored and adjusted position secondary to reports of low back pain. Discussed possible benefits of aquatics for low back pain management.    Eval: Patient presents with left shoulder pain that started in May of this year. Patient presents with limitations in ROM,  strength and mobility of left UE that is impairing his daily function. Patient would greatly benefit from skilled PT to improve overall limitations and improve QOL,   OBJECTIVE IMPAIRMENTS: decreased ROM, decreased strength, impaired UE functional use, postural dysfunction, and pain.    ACTIVITY LIMITATIONS: lifting, bathing, dressing, and reach over head   PARTICIPATION LIMITATIONS: cleaning and playing guitar    PERSONAL FACTORS: Fitness are also affecting patient's functional outcome.    REHAB POTENTIAL: Excellent   CLINICAL DECISION MAKING: Stable/uncomplicated   EVALUATION COMPLEXITY: Low   GOALS: Goals reviewed with patient? yes   SHORT TERM GOALS: Target date: 09/01/2022  Patient will be independent in self management strategies to improve quality of life and functional outcomes. Baseline: New Program Goal status: MET   2.  Patient will report at least 50% improvement in overall symptoms and/or function to demonstrate improved functional mobility Baseline: 0% better PROGRESSING    3.  Patient will be able to demonstrate painfree shoulder ROM to improve ability to reach overhead. Baseline:  Goal status: PROGRESSING          LONG TERM GOALS: Target date: 09/29/2022    Patient will report at least 75% improvement in overall symptoms and/or function to demonstrate improved functional mobility Baseline: 0% better Goal status: PROGRESSING    2.  Patient will be able to sleep without pain in left shoulder to improve sleep quality.  Baseline:  Goal status: MET    3.  Patient will be able to play guitar  Baseline:  Goal status: MET          PLAN:   PT FREQUENCY: 1-2x/week for a total of 8 visits over 8 week certification   PT DURATION: 8 weeks   PLANNED INTERVENTIONS: Therapeutic exercises, Therapeutic activity, Neuromuscular re-education, Balance training, Gait training, Patient/Family education, Self Care, Joint mobilization, Joint manipulation, Vestibular  training, Canalith repositioning, Aquatic Therapy, Dry Needling, Electrical stimulation, Spinal manipulation, Spinal mobilization, Cryotherapy, Moist heat, Vasopneumatic device, Traction, Ultrasound, Ionotophoresis 56m/ml Dexamethasone, Manual therapy, and Re-evaluation   PLAN FOR NEXT SESSION: cervical ROM/STM shoulder ROM, manual as needed, MMT  to left shoulder, thoracic and cervical mobility, posture seated and standing, pelvic tilts, print aquatic exercises    1:51 PM, 09/17/22 Jerene Pitch, DPT Physical Therapy with [facility]

## 2022-09-22 ENCOUNTER — Ambulatory Visit (INDEPENDENT_AMBULATORY_CARE_PROVIDER_SITE_OTHER): Payer: Medicare Other | Admitting: Physical Therapy

## 2022-09-22 ENCOUNTER — Encounter: Payer: Self-pay | Admitting: Physical Therapy

## 2022-09-22 ENCOUNTER — Encounter: Payer: Self-pay | Admitting: Physician Assistant

## 2022-09-22 DIAGNOSIS — M6281 Muscle weakness (generalized): Secondary | ICD-10-CM

## 2022-09-22 DIAGNOSIS — M25512 Pain in left shoulder: Secondary | ICD-10-CM | POA: Diagnosis not present

## 2022-09-22 DIAGNOSIS — M545 Low back pain, unspecified: Secondary | ICD-10-CM

## 2022-09-22 DIAGNOSIS — M5416 Radiculopathy, lumbar region: Secondary | ICD-10-CM

## 2022-09-22 NOTE — Therapy (Signed)
OUTPATIENT PHYSICAL THERAPY TREATMENT NOTE   Patient Name: Andrew Benitez MRN: [id number] DOB:Oct 20, 1950, 72 y.o., male Today's Date: 09/22/2022  PCP: Inda Coke, PA REFERRING PROVIDER: Inda Coke, PA  END OF SESSION:   PT End of Session - 09/22/22 1305     Visit Number 7    Number of Visits 8    Date for PT Re-Evaluation 09/29/22    Authorization Type medicare    Progress Note Due on Visit 10    PT Start Time 9509    PT Stop Time 1346    PT Time Calculation (min) 38 min    Activity Tolerance Patient tolerated treatment well;No increased pain             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)   Past Surgical History:  Procedure Laterality Date   BREAST SURGERY  02/24/1981   Gynecomastia; date is an estimate   CYSTOSCOPY WITH INSERTION OF UROLIFT  2018   MASTECTOMY FOR GYNECOMASTIA Left 1983   SKIN CANCER EXCISION  2018   right side of neck and left forehead   TONSILLECTOMY     Patient Active Problem List   Diagnosis Date Noted   Cubital tunnel syndrome, 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   Lumbar radiculopathy 03/26/2022   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   Macular RPE mottling 11/13/2021   Nonexudative age-related macular degeneration, left eye, 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   Macular degeneration of both eyes 09/20/2021   Low back pain 09/19/2021   Depression       REFERRING DIAG: S46.012D (ICD-10-CM) - Strain of left rotator cuff capsule, subsequent encounter   THERAPY DIAG:  Acute pain of left shoulder    Muscle weakness (generalized)    Rationale for Evaluation and Treatment: Rehabilitation   ONSET DATE: May  2023    SUBJECTIVE:                                                                                                                                                                             SUBJECTIVE  STATEMENT: 09/22/2022 States that the shoulder pain is better and not feeling his shoulder pain like he used to so maybe 50% improvement but back pain and leg pain is worse. States that he is taking alternating tylenol and that is helping a bit  Eval: States that he has been having left shoulder pain over the last few months. States laying on his left shoulder he feels it and at various time during the day and when he reaches behind to reach behind his back.  States that he was also having numbness in his right two fingers but that is a little bit better. He is wanting to be treated for his left shoulder pain.       PERTINENT HISTORY: benign enlarged prostate with baseline urgency, no other changes in bowel/bladder.    PAIN:  Are you having pain? Yes: NPRS scale: 0/10 Pain location: left shoulder  Pain description: awarness Aggravating factors: laying on it, palpation, reaching behind back Relieving factors: rest   PRECAUTIONS: None   WEIGHT BEARING RESTRICTIONS: No   FALLS:  Has patient fallen in last 6 months? No       PLOF: Independent   PATIENT GOALS:to have less pain and have HEP to work on for shoulder at home   NEXT MD VISIT:    OBJECTIVE:    DIAGNOSTIC FINDINGS:  02/24/22 cervical spine xray IMPRESSION: 1. No evidence for fracture or malalignment. 2. Mild degenerative changes.               Cervical A/PROM:    09/01/2022   09/17/22   Flexion WFL  WFL    Extension WFL*  WFL*    R ROT  60% limited*  50% limited*   L ROT  60% limited *  50% limited   R SB WFL *   WFL  L SB WFL*  WFL                        * tightness/pull                     (Blank rows = not tested)              UE Measurements       Upper Extremity Right 09/17/22 Left 09/17/22    A/PROM MMT A/PROM MMT  Shoulder Flexion 170 4+ 170 4+  Shoulder Extension          Shoulder Abduction WFL 4* WFL 4+  Shoulder Adduction          Shoulder Internal Rotation Reaches to T7SP 4+ Reaches to T7SP* 4+   Shoulder External Rotation T3 SP 4+ T3 SP 4+  Elbow Flexion          Elbow Extension          Wrist Flexion          Wrist Extension          Wrist Supination          Wrist Pronation          Wrist Ulnar Deviation          Wrist Radial Deviation          Grip Strength NA   NA                          (Blank rows = not tested)                       * pain Right handed                  TODAY'S TREATMENT:  DATE: 09/22/2022 Therapeutic Exercise:           Aerobic: Supine: post pelvic tilt 2 minutes with shoulder flexion S/l:            Seated:  review of HEP and different aquatic exercises to try in pool -10 minutes           Standing:  Neuromuscular Re-education:seated on exercise ball balance feet together, heel raises balance, shoulder flexion - 15 minutes total, shoulder flexion with reduced lumbar extension - 10 minutes in different positions  Manual Therapy:   Therapeutic Activity:  Self Care:  Trigger Point Dry Needling:  Modalities:        PATIENT EDUCATION:  Education details: review of  HEP. On benefits of aquatic THERAPY FOR LBP Person educated: Patient Education method: Explanation, Demonstration, and Handouts Education comprehension: verbalized understanding     HOME EXERCISE PROGRAM: [id number]   ASSESSMENT:   CLINICAL IMPRESSION: 09/22/2022 Session focused on education and review of aquatic exercises for lumbar pain. Discussed correlation between shoulder motion and lumbar pain. Educated patient on sitting on exercise ball which was more comfortable to reduce pain in back. Tolerated session and will continue with current POC as tolerated.    Eval: Patient presents with left shoulder pain that started in May of this year. Patient presents with limitations in ROM, strength and mobility of left UE that is impairing his daily  function. Patient would greatly benefit from skilled PT to improve overall limitations and improve QOL,   OBJECTIVE IMPAIRMENTS: decreased ROM, decreased strength, impaired UE functional use, postural dysfunction, and pain.    ACTIVITY LIMITATIONS: lifting, bathing, dressing, and reach over head   PARTICIPATION LIMITATIONS: cleaning and playing guitar    PERSONAL FACTORS: Fitness are also affecting patient's functional outcome.    REHAB POTENTIAL: Excellent   CLINICAL DECISION MAKING: Stable/uncomplicated   EVALUATION COMPLEXITY: Low   GOALS: Goals reviewed with patient? yes   SHORT TERM GOALS: Target date: 09/01/2022  Patient will be independent in self management strategies to improve quality of life and functional outcomes. Baseline: New Program Goal status: MET   2.  Patient will report at least 50% improvement in overall symptoms and/or function to demonstrate improved functional mobility Baseline: 0% better PROGRESSING    3.  Patient will be able to demonstrate painfree shoulder ROM to improve ability to reach overhead. Baseline:  Goal status: PROGRESSING          LONG TERM GOALS: Target date: 09/29/2022    Patient will report at least 75% improvement in overall symptoms and/or function to demonstrate improved functional mobility Baseline: 0% better Goal status: PROGRESSING    2.  Patient will be able to sleep without pain in left shoulder to improve sleep quality.  Baseline:  Goal status: MET    3.  Patient will be able to play guitar  Baseline:  Goal status: MET          PLAN:   PT FREQUENCY: 1-2x/week for a total of 8 visits over 8 week certification   PT DURATION: 8 weeks   PLANNED INTERVENTIONS: Therapeutic exercises, Therapeutic activity, Neuromuscular re-education, Balance training, Gait training, Patient/Family education, Self Care, Joint mobilization, Joint manipulation, Vestibular training, Canalith repositioning, Aquatic Therapy, Dry Needling,  Electrical stimulation, Spinal manipulation, Spinal mobilization, Cryotherapy, Moist heat, Vasopneumatic device, Traction, Ultrasound, Ionotophoresis '4mg'$ /ml Dexamethasone, Manual therapy, and Re-evaluation   PLAN FOR NEXT SESSION: cervical ROM/STM shoulder ROM, manual as needed, MMT to left shoulder, thoracic and cervical mobility, posture  seated and standing, pelvic tilts, print aquatic exercises    2:35 PM, 09/22/22 Jerene Pitch, DPT Physical Therapy with [facility]

## 2022-09-24 ENCOUNTER — Ambulatory Visit (INDEPENDENT_AMBULATORY_CARE_PROVIDER_SITE_OTHER): Payer: Medicare Other | Admitting: Physical Therapy

## 2022-09-24 ENCOUNTER — Encounter: Payer: Self-pay | Admitting: Physical Therapy

## 2022-09-24 DIAGNOSIS — M25512 Pain in left shoulder: Secondary | ICD-10-CM

## 2022-09-24 DIAGNOSIS — M6281 Muscle weakness (generalized): Secondary | ICD-10-CM | POA: Diagnosis not present

## 2022-09-24 NOTE — Therapy (Signed)
OUTPATIENT PHYSICAL THERAPY TREATMENT NOTE PHYSICAL THERAPY DISCHARGE SUMMARY  Visits from Start of Care: 8  Current functional level related to goals / functional outcomes: All goals met   Remaining deficits: See below   Education / Equipment: See below   Patient agrees to discharge. Patient goals were met. Patient is being discharged due to meeting the stated rehab goals.   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)   Past Surgical History:  Procedure Laterality Date   BREAST SURGERY  02/24/1981   Gynecomastia; date is an estimate   CYSTOSCOPY WITH INSERTION OF UROLIFT  2018   MASTECTOMY FOR GYNECOMASTIA Left 1983   SKIN CANCER EXCISION  2018   right side of neck and left forehead   TONSILLECTOMY     Patient Active Problem List   Diagnosis Date Noted   Cubital tunnel syndrome, 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   Lumbar radiculopathy 03/26/2022   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   Macular RPE mottling 11/13/2021   Nonexudative age-related macular degeneration, left eye, 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   Macular degeneration of both eyes 09/20/2021   Low back pain 09/19/2021   Depression       REFERRING DIAG: S46.012D (ICD-10-CM) - Strain of left rotator cuff capsule, subsequent encounter   THERAPY DIAG:  Acute  pain of left shoulder    Muscle weakness (generalized)    Rationale for Evaluation and Treatment: Rehabilitation   ONSET DATE: May  2023    SUBJECTIVE:                                                                                                                                                                             SUBJECTIVE STATEMENT: 09/24/2022 States that his legs are bothering him but his shoulder is feeling alright. States that it is easier to sleep on his left side than it  was and feels 75% -80 better since the start of PT.   Eval: States that he has been having left shoulder pain over the last few months. States laying on his left shoulder he feels it and at various time during the day and when he reaches behind to reach behind his back. States that he was also having numbness in his right two fingers but that is a little bit better. He is wanting to be treated for his left shoulder pain.       PERTINENT HISTORY: benign enlarged prostate with baseline urgency, no other changes in bowel/bladder.    PAIN:  Are you having pain? Yes: NPRS scale: 1/10 Pain location: legs Pain description: achy Aggravating factors: laying on it, palpation, reaching behind back Relieving factors: rest   PRECAUTIONS: None   WEIGHT BEARING RESTRICTIONS: No   FALLS:  Has patient fallen in last 6 months? No       PLOF: Independent   PATIENT GOALS:to have less pain and have HEP to work on for shoulder at home   NEXT MD VISIT:    OBJECTIVE:    DIAGNOSTIC FINDINGS:  02/24/22 cervical spine xray IMPRESSION: 1. No evidence for fracture or malalignment. 2. Mild degenerative changes.               Cervical A/PROM:    09/01/2022   09/24/22   Flexion WFL  WFL    Extension WFL*  WFL*    R ROT  60% limited*  50% limited   L ROT  60% limited *  25% limited   R SB WFL *   WFL  L SB WFL*  WFL                        * tightness/pull                     (Blank rows = not tested)               UE Measurements       Upper Extremity Right 09/24/22 Left 09/24/22    A/PROM MMT A/PROM MMT  Shoulder Flexion 170 5 170 5  Shoulder Extension          Shoulder Abduction WFL 5 WFL 4+  Shoulder Adduction          Shoulder Internal Rotation Reaches to T7SP 4+ Reaches to T5SP 4+  Shoulder External Rotation T3 SP 5 T3 SP 5  Elbow Flexion          Elbow Extension          Wrist Flexion          Wrist Extension          Wrist Supination          Wrist Pronation          Wrist Ulnar Deviation          Wrist Radial Deviation          Grip Strength NA   NA                          (Blank rows = not tested)                       * pain Right handed                  TODAY'S TREATMENT:  DATE: 09/24/2022 Therapeutic Exercise:           Aerobic: Supine:   S/l:            Seated:  review of HEP and different aquatic exercises to try in pool, on different things to try for pain relief- TENS, HEAT, Topicals           Standing:  Neuromuscular Re-education:  Manual Therapy:   Therapeutic Activity:  Self Care:  Trigger Point Dry Needling:  Modalities: IFES- to lumbar spine 10 minutes 14.5 Volts - minimal change in symptoms (with set up and education additional 5 minutes)       PATIENT EDUCATION:  Education details: review of  HEP, on progress made, on different things to try for pain relief of back Person educated: Patient Education method: Explanation, Demonstration, and Handouts Education comprehension: verbalized understanding     HOME EXERCISE PROGRAM: [id number]   ASSESSMENT:   CLINICAL IMPRESSION: 09/24/2022 Overall all goals met in regards to shoulder pain. Leg pain from back is greatest limitation at this time. Discussed and reviewed possible benefits for aquatics and trying pool at gym. Dicussed and educated patient on different  topicals and following up with MD regarding his questions about possible pain medications that would help him. Patient ot discharge from PT to HEP secondary to progress made for his shoulder. Answered all questions prior to DC.   Eval: Patient presents with left shoulder pain that started in May of this year. Patient presents with limitations in ROM, strength and mobility of left UE that is impairing his daily function. Patient would greatly benefit from skilled PT to improve overall limitations and improve QOL,   OBJECTIVE IMPAIRMENTS: decreased ROM, decreased strength, impaired UE functional use, postural dysfunction, and pain.    ACTIVITY LIMITATIONS: lifting, bathing, dressing, and reach over head   PARTICIPATION LIMITATIONS: cleaning and playing guitar    PERSONAL FACTORS: Fitness are also affecting patient's functional outcome.    REHAB POTENTIAL: Excellent   CLINICAL DECISION MAKING: Stable/uncomplicated   EVALUATION COMPLEXITY: Low   GOALS: Goals reviewed with patient? yes   SHORT TERM GOALS: Target date: 09/01/2022  Patient will be independent in self management strategies to improve quality of life and functional outcomes. Baseline: New Program Goal status: MET   2.  Patient will report at least 50% improvement in overall symptoms and/or function to demonstrate improved functional mobility Baseline: 0% better PROGRESSING    3.  Patient will be able to demonstrate painfree shoulder ROM to improve ability to reach overhead. Baseline:  Goal status: MET          LONG TERM GOALS: Target date: 09/29/2022    Patient will report at least 75% improvement in overall symptoms and/or function to demonstrate improved functional mobility Baseline: 0% better Goal status: MET    2.  Patient will be able to sleep without pain in left shoulder to improve sleep quality.  Baseline:  Goal status: MET    3.  Patient will be able to play guitar  Baseline:  Goal status: MET           PLAN:   PT FREQUENCY: 1-2x/week for a total of 8 visits over 8 week certification   PT DURATION: 8 weeks   PLANNED INTERVENTIONS: Therapeutic exercises, Therapeutic activity, Neuromuscular re-education, Balance training, Gait training, Patient/Family education, Self Care, Joint mobilization, Joint manipulation, Vestibular training, Canalith repositioning, Aquatic Therapy, Dry Needling, Electrical stimulation, Spinal manipulation, Spinal mobilization, Cryotherapy, Moist heat, Vasopneumatic device, Traction, Ultrasound,  Ionotophoresis '4mg'$ /ml Dexamethasone, Manual therapy, and Re-evaluation   PLAN FOR NEXT SESSION: DC to HEP   2:52 PM, 09/24/22 Jerene Pitch, DPT Physical Therapy with [facility]

## 2022-10-06 ENCOUNTER — Ambulatory Visit
Admission: RE | Admit: 2022-10-06 | Discharge: 2022-10-06 | Disposition: A | Discharge status: Home / Self Care | Payer: Medicare Other | Source: Ambulatory Visit | Attending: Physician Assistant | Admitting: Physician Assistant

## 2022-10-06 DIAGNOSIS — M5416 Radiculopathy, lumbar region: Secondary | ICD-10-CM

## 2022-10-06 MED ORDER — METHYLPREDNISOLONE ACETATE 40 MG/ML INJ SUSP (RADIOLOG
80.0000 mg | Freq: Once | INTRAMUSCULAR | Status: AC
  Administered 2022-10-06: 80 mg via EPIDURAL

## 2022-10-06 MED ORDER — IOPAMIDOL (ISOVUE-M 200) INJECTION 41%
1.0000 mL | Freq: Once | INTRAMUSCULAR | Status: AC
  Administered 2022-10-06: 1 mL via EPIDURAL

## 2022-10-06 NOTE — Discharge Instructions (Signed)

## 2022-10-07 ENCOUNTER — Other Ambulatory Visit: Payer: Self-pay | Admitting: Physician Assistant

## 2022-10-07 MED ORDER — ARIPIPRAZOLE 5 MG PO TABS: 2.5000 mg | ORAL_TABLET | Freq: Every day | ORAL | 1 refills | Status: DC

## 2022-10-14 ENCOUNTER — Emergency Department (HOSPITAL_COMMUNITY)
Admission: EM | Admit: 2022-10-14 | Discharge: 2022-10-14 | Disposition: A | Discharge status: Home / Self Care | Payer: Medicare Other | Attending: Emergency Medicine | Admitting: Emergency Medicine

## 2022-10-14 ENCOUNTER — Encounter (HOSPITAL_COMMUNITY): Payer: Self-pay | Admitting: Emergency Medicine

## 2022-10-14 ENCOUNTER — Other Ambulatory Visit: Payer: Self-pay

## 2022-10-14 DIAGNOSIS — Y992 Volunteer activity: Secondary | ICD-10-CM | POA: Insufficient documentation

## 2022-10-14 DIAGNOSIS — W228XXA Striking against or struck by other objects, initial encounter: Secondary | ICD-10-CM | POA: Diagnosis not present

## 2022-10-14 DIAGNOSIS — S0501XA Injury of conjunctiva and corneal abrasion without foreign body, right eye, initial encounter: Secondary | ICD-10-CM

## 2022-10-14 DIAGNOSIS — H5711 Ocular pain, right eye: Secondary | ICD-10-CM | POA: Diagnosis present

## 2022-10-14 MED ORDER — FLUORESCEIN SODIUM 1 MG OP STRP
1.0000 | ORAL_STRIP | Freq: Once | OPHTHALMIC | Status: AC
  Administered 2022-10-14: 1 via OPHTHALMIC
  Filled 2022-10-14: qty 1

## 2022-10-14 MED ORDER — TETRACAINE HCL 0.5 % OP SOLN
2.0000 [drp] | Freq: Once | OPHTHALMIC | Status: AC
  Administered 2022-10-14: 2 [drp] via OPHTHALMIC
  Filled 2022-10-14: qty 4

## 2022-10-14 MED ORDER — POLYMYXIN B-TRIMETHOPRIM 10000-0.1 UNIT/ML-% OP SOLN: 1.0000 [drp] | OPHTHALMIC | 0 refills | Status: DC

## 2022-10-14 NOTE — ED Triage Notes (Signed)
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

## 2022-10-14 NOTE — ED Notes (Signed)
Patient has 20/25 vision in his right eye (damaged eye) and 20/16 in his left eye.

## 2022-10-14 NOTE — ED Provider Notes (Signed)
[place] EMERGENCY DEPARTMENT AT [facility] Provider Note   CSN: [id number] Arrival date & time: 10/14/22  1307     History  Chief Complaint  Patient presents with   Eye Pain    Andrew Benitez is a 72 y.o. male.  72 year old male with history of macular degeneration, presents with concern for right eye irritation.  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  Patient does not wear glasses or contacts, notes minimal irritation to his eye, no photophobia.  Mentioned what happened to another volunteer on the project who recommended he come to the ER for evaluation.  Last tetanus is less than 5 years ago.  No other complaints or concerns.       Home Medications Prior to Admission medications   Medication Sig Start Date End Date Taking? Authorizing Provider  trimethoprim-polymyxin b (POLYTRIM) ophthalmic solution Place 1 drop into the right eye every 4 (four) hours. 10/14/22  Yes Tacy Learn, PA-C  ARIPiprazole (ABILIFY) 5 MG tablet Take 0.5 tablets (2.5 mg total) by mouth daily. 10/07/22 04/05/23  Inda Coke, PA  benzonatate (TESSALON) 100 MG capsule Take 1 capsule (100 mg total) by mouth 3 (three) times daily as needed for cough. 08/20/22   Inda Coke, PA  buPROPion (WELLBUTRIN XL) 300 MG 24 hr tablet Take 1 tablet (300 mg total) by mouth daily. 04/15/22   Inda Coke, PA  DULoxetine (CYMBALTA) 30 MG capsule Take 1 capsule (30 mg total) by mouth daily. 04/15/22   Inda Coke, PA  ferrous sulfate 325 (65 FE) MG tablet Take 325 mg by mouth every other day.    [provider]  FIBER PO Take 1 each by mouth in the morning and at bedtime. Tablespoon    [provider]  finasteride (PROPECIA) 1 MG tablet Take 1 tablet (1 mg total) by mouth daily. 08/20/22   Inda Coke, PA  ipratropium (ATROVENT) 0.03 % nasal spray Place 2 sprays into both nostrils every 12 (twelve) hours. 08/20/22    Inda Coke, PA  Multiple Vitamins-Minerals (PRESERVISION AREDS 2) CAPS Take 1 capsule by mouth 2 (two) times daily before a meal. With meals breakfast and dinner 09/20/21   [provider]  vitamin C (ASCORBIC ACID) 500 MG tablet Take 500 mg by mouth daily.    [provider]      Allergies    Patient has no known allergies.    Review of Systems   Review of Systems Negative except as per HPI Physical Exam Updated Vital Signs BP (!) 155/81 (BP Location: Left Arm)   Pulse 95   Temp 98 F (36.7 C) (Oral)   Resp 16   SpO2 99%  Physical Exam Vitals and nursing note reviewed.  Constitutional:      General: He is not in acute distress.    Appearance: He is well-developed. He is not diaphoretic.  HENT:     Head: Normocephalic and atraumatic.  Eyes:     General: Lids are normal. Vision grossly intact.     Extraocular Movements: Extraocular movements intact.     Right eye: Normal extraocular motion and no nystagmus.     Left eye: Normal extraocular motion and no nystagmus.     Pupils: Pupils are equal, round, and reactive to light.     Right eye: Pupil is round, reactive and not sluggish. Corneal abrasion and fluorescein uptake present. Seidel exam negative.  Slit lamp exam:    Right eye: Anterior chamber quiet.      Comments: Right conjunctiva minimally injected  Pulmonary:     Effort: Pulmonary effort is normal.  Skin:    General: Skin is warm and dry.     Findings: No erythema or rash.  Neurological:     Mental Status: He is alert and oriented to person, place, and time.  Psychiatric:        Behavior: Behavior normal.     ED Results / Procedures / Treatments   Labs (all labs ordered are listed, but only abnormal results are displayed) Labs Reviewed - No data to display  EKG None  Radiology No results found.  Procedures Procedures    Medications Ordered in ED Medications  fluorescein ophthalmic strip 1 strip (1 strip Both Eyes Given  10/14/22 1339)  tetracaine (PONTOCAINE) 0.5 % ophthalmic solution 2 drop (2 drops Both Eyes Given 10/14/22 1339)    ED Course/ Medical Decision Making/ A&P                             Medical Decision Making Risk Prescription drug management.   72 year old male with concern for corneal abrasion to right eye after he pulled a nail doing some carpentry work and the nail bounced back up and struck him in the right eye.  Visual acuity assessed, 20/25 in the right, 20/16 in the left.  Fluorescein stain reveals small 1 to 2 mm corneal abrasion at the 5 o'clock position of the right eye, no streaming.  Recommend antibiotic drops.  Preservative-free ointment at night.  Follow-up with ophthalmology for recheck.        Final Clinical Impression(s) / ED Diagnoses Final diagnoses:  Abrasion of right cornea, initial encounter    Rx / DC Orders ED Discharge Orders          Ordered    trimethoprim-polymyxin b (POLYTRIM) ophthalmic solution  Every 4 hours        10/14/22 1350              Roque Lias 10/14/22 1358    Lacretia Leigh, MD 10/14/22 1428

## 2022-10-14 NOTE — Discharge Instructions (Signed)
Apply eyedrops as prescribed. Use a preservative-free eye ointment at night. Follow-up with an ophthalmologist, call to schedule appointment, resource provided.

## 2022-10-27 ENCOUNTER — Telehealth: Payer: Self-pay

## 2022-10-27 NOTE — Telephone Encounter (Signed)
     Patient  visit on 10/14/2022  at [facility] was for Abrasion of right cornea, initial encounter.  Have you been able to follow up with your primary care physician? Yes  The patient was or was not able to obtain any needed medicine or equipment. Patient was able to obtain medication.  Are there diet recommendations that you are having difficulty following? No  Patient expresses understanding of discharge instructions and education provided has no other needs at this time. Yes   [place] Resource Care Guide   [email or web address]  ?? [phone]   Website: [email or web address]  [email or web address]

## 2022-11-26 ENCOUNTER — Encounter: Payer: Self-pay | Admitting: Physician Assistant

## 2022-12-08 ENCOUNTER — Encounter: Payer: Self-pay | Admitting: Physician Assistant

## 2022-12-08 DIAGNOSIS — M545 Low back pain, unspecified: Secondary | ICD-10-CM

## 2022-12-08 DIAGNOSIS — M5416 Radiculopathy, lumbar region: Secondary | ICD-10-CM

## 2022-12-08 NOTE — Telephone Encounter (Signed)
Pt requesting epidural again. Last epidural was 10/06/2022.

## 2022-12-12 ENCOUNTER — Encounter: Payer: Self-pay | Admitting: Physician Assistant

## 2022-12-12 ENCOUNTER — Ambulatory Visit
Admission: RE | Admit: 2022-12-12 | Discharge: 2022-12-12 | Disposition: A | Discharge status: Home / Self Care | Payer: Medicare Other | Source: Ambulatory Visit | Attending: Physician Assistant | Admitting: Physician Assistant

## 2022-12-12 DIAGNOSIS — M5416 Radiculopathy, lumbar region: Secondary | ICD-10-CM

## 2022-12-12 DIAGNOSIS — M545 Low back pain, unspecified: Secondary | ICD-10-CM

## 2022-12-12 MED ORDER — METHYLPREDNISOLONE ACETATE 40 MG/ML INJ SUSP (RADIOLOG
80.0000 mg | Freq: Once | INTRAMUSCULAR | Status: AC
  Administered 2022-12-12: 80 mg via EPIDURAL

## 2022-12-12 MED ORDER — IOPAMIDOL (ISOVUE-M 200) INJECTION 41%
1.0000 mL | Freq: Once | INTRAMUSCULAR | Status: AC
  Administered 2022-12-12: 1 mL via EPIDURAL

## 2022-12-12 NOTE — Discharge Instructions (Signed)

## 2023-01-14 ENCOUNTER — Ambulatory Visit (INDEPENDENT_AMBULATORY_CARE_PROVIDER_SITE_OTHER): Payer: Medicare Other | Admitting: Family Medicine

## 2023-01-14 ENCOUNTER — Encounter: Payer: Self-pay | Admitting: Family Medicine

## 2023-01-14 VITALS — BP 116/70 | HR 56 | Ht 71.0 in | Wt 158.0 lb

## 2023-01-14 DIAGNOSIS — M48062 Spinal stenosis, lumbar region with neurogenic claudication: Secondary | ICD-10-CM

## 2023-01-14 NOTE — Patient Instructions (Signed)
Thank you for coming in today.   Keep working on core strength.   We can re do the epidurals as needed starting on May 17th.   You should hear from [facility] soon about scheduling.

## 2023-01-14 NOTE — Progress Notes (Unsigned)
Rubin Payor, PhD, LAT, ATC acting as a scribe for Andrew Graham, MD.  Andrew Benitez is a 72 y.o. male who presents to [facility] at [facility] today for LBP. Pt was last seen by Dr. Denyse Amass on 03/26/22 and a repeat ESI was ordered and later performed on 04/04/22. Pt's PCP has re-ordered 4 repeat ESI since, most recently on 12/12/22.  Today, pt reports continued lower back pain, radiating into B LE R>L. Occasionally sx will extend past the knee. Denies weakness in B LE. Rare n/t, sharp shooting pains. Pain with extension since ESI has worn off. Denies night disturbance.   He has a list of questions focused around his underlying diagnosis and potential surgical options versus continued epidural steroid injections.  He finds the injections to be helpful and lasting about 9 weeks.  He denies any lower extremity weakness.  Dx imaging: 11/12/21 L-spine MRI  09/19/21 L-spine XR  Pertinent review of systems: No fevers or chills  Relevant historical information: History of depression   Exam:  BP 116/70   Pulse (!) 56   Ht 5\' 11"  (1.803 m)   Wt 158 lb (71.7 kg)   SpO2 97%   BMI 22.04 kg/m  General: Well Developed, well nourished, and in no acute distress.   MSK: L-spine normal motion.  Normal gait.  Lower extremity strength is intact.    Lab and Radiology Results  EXAM: MRI LUMBAR SPINE WITHOUT CONTRAST   TECHNIQUE: Multiplanar, multisequence MR imaging of the lumbar spine was performed. No intravenous contrast was administered.   COMPARISON:  X-ray lumbar 09/19/2021.   FINDINGS: Segmentation: Standard; the lowest formed disc space is designated L5-S1.   Alignment:  Normal.   Vertebrae: Vertebral body heights are preserved. Marrow signal is normal. There is no suspicious marrow signal abnormality. There is trace edema in the right L5 posterior elements, likely reactive/degenerative. There is no other marrow edema.   Conus medullaris and cauda equina: Conus  extends to the L1 level. Conus and cauda equina appear normal.   Paraspinal and other soft tissues: A small T2 hyperintense lesion in the left kidney likely reflects a cyst. Is mild perifacetal soft tissue edema on the right at L4-L5. The paraspinal soft tissues are otherwise unremarkable.   Disc levels:   There is mild disc desiccation at L3-L4 through L5-S1. Facet arthropathy is most advanced at L4-L5.   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   T12-L1: No significant spinal canal or neural foraminal stenosis.   L1-L2: No significant spinal canal or neural foraminal stenosis.   L2-L3: There is a mild disc bulge and bilateral facet arthropathy superimposed on congenital canal narrowing resulting in mild-to-moderate spinal canal stenosis with narrowing of the subarticular zones without significant neural foraminal stenosis   L3-L4: There is a diffuse disc bulge, ligamentum flavum thickening, and bilateral facet arthropathy resulting in moderate spinal canal stenosis with narrowing of the bilateral subarticular zones, and mild bilateral neural foraminal stenosis   L4-L5: There is a diffuse disc bulge, ligamentum flavum thickening, and moderate bilateral facet arthropathy with a 5 mm medially projecting left synovial cyst resulting in severe spinal canal stenosis with compression of the cauda equina nerve roots and moderate right and mild left neural foraminal stenosis.   L5-S1: There is a diffuse disc bulge with a left subarticular zone protrusion and annular fissure and mild bilateral facet arthropathy resulting in impingement of the traversing left S1 nerve root. There  is no significant neural foraminal stenosis.   IMPRESSION: 1. Degenerative changes at L4-L5 including a diffuse disc bulge and moderate bilateral facet arthropathy with a small left facet joint synovial cyst superimposed on congenital canal narrowing  resulting in severe spinal canal stenosis with impingement of the cauda equina nerve roots and moderate right and mild left neural foraminal stenosis. 2. Associated mild perifacetal soft tissue edema on the right at L4-L5 with mild reactive marrow edema in the right L5 posterior elements, which could reflect a source of pain. 3. Left subarticular zone disc protrusion and annular fissure at L5-S1 with impingement of the traversing left S1 nerve root. 4. Mild-to-moderate spinal canal stenosis at L2-L3 and moderate spinal canal stenosis at L3-L4.     Electronically Signed   By: Lesia Hausen M.D.   On: 11/13/2021 10:44 I, Andrew Benitez, personally (independently) visualized and performed the interpretation of the images attached in this note.      Assessment and Plan: 72 y.o. 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  He has had multiple epidural steroid injections (interlaminar L4-5) that provide good but somewhat temporary benefit. We reviewed the MRI report and images and talked about treatment plan and options.  For now he is doing okay with the intermittent epidural steroid injections.  I think it would be Megna to have a surgical consultation with a spine surgeon to talk about the specifics of surgery options for him.  I expect it would likely be laminectomy with possible fusion.  I think his current plan of continued epidural steroid injections is reasonable to continue but he should really should have a better understanding of his surgical options to make a good choice for himself.  We discussed some exercise options.  Recommend exercises that were to work on core strength such as swimming.  I think cycling or an exercise bike would be a good option as it would allow lumbar flexion which is probably helpful for him. Check back as needed. Total encounter time 30 minutes including face-to-face time with the patient and,  reviewing past medical record, and charting on the date of service.      PDMP not reviewed this encounter. Orders Placed This Encounter  Procedures   Ambulatory referral to Neurosurgery    Referral Priority:   Routine    Referral Type:   Surgical    Referral Reason:   Specialty Services Required    Requested Specialty:   Neurosurgery    Number of Visits Requested:   1   No orders of the defined types were placed in this encounter.    Discussed warning signs or symptoms. Please see discharge instructions. Patient expresses understanding.   The above documentation has been reviewed and is accurate and complete Andrew Benitez, M.D.

## 2023-01-15 DIAGNOSIS — M48062 Spinal stenosis, lumbar region with neurogenic claudication: Secondary | ICD-10-CM | POA: Insufficient documentation

## 2023-01-17 ENCOUNTER — Other Ambulatory Visit: Payer: Self-pay | Admitting: Physician Assistant

## 2023-01-30 ENCOUNTER — Encounter: Payer: Self-pay | Admitting: Physician Assistant

## 2023-01-30 ENCOUNTER — Encounter: Payer: Self-pay | Admitting: Family Medicine

## 2023-01-30 DIAGNOSIS — M5416 Radiculopathy, lumbar region: Secondary | ICD-10-CM

## 2023-02-02 NOTE — Telephone Encounter (Signed)
Looks like order/referral was placed 01/14/23 to [facility].   Luanna Cole, will you follow-up on this referral please? See note from pt below.

## 2023-02-13 ENCOUNTER — Ambulatory Visit
Admission: RE | Admit: 2023-02-13 | Discharge: 2023-02-13 | Disposition: A | Discharge status: Home / Self Care | Payer: Medicare Other | Source: Ambulatory Visit | Attending: Physician Assistant | Admitting: Physician Assistant

## 2023-02-13 DIAGNOSIS — M5416 Radiculopathy, lumbar region: Secondary | ICD-10-CM

## 2023-02-13 MED ORDER — METHYLPREDNISOLONE ACETATE 40 MG/ML INJ SUSP (RADIOLOG
80.0000 mg | Freq: Once | INTRAMUSCULAR | Status: AC
  Administered 2023-02-13: 80 mg via EPIDURAL

## 2023-02-13 MED ORDER — IOPAMIDOL (ISOVUE-M 200) INJECTION 41%
1.0000 mL | Freq: Once | INTRAMUSCULAR | Status: AC
  Administered 2023-02-13: 1 mL via EPIDURAL

## 2023-02-13 NOTE — Discharge Instructions (Signed)

## 2023-03-29 ENCOUNTER — Encounter: Payer: Self-pay | Admitting: Physician Assistant

## 2023-03-30 MED ORDER — ARIPIPRAZOLE 5 MG PO TABS: 2.5000 mg | ORAL_TABLET | Freq: Every day | ORAL | 1 refills | Status: DC

## 2023-04-01 ENCOUNTER — Encounter: Payer: Self-pay | Admitting: Physician Assistant

## 2023-04-01 DIAGNOSIS — M5416 Radiculopathy, lumbar region: Secondary | ICD-10-CM

## 2023-04-16 NOTE — Discharge Instructions (Signed)

## 2023-04-17 ENCOUNTER — Ambulatory Visit
Admission: RE | Admit: 2023-04-17 | Discharge: 2023-04-17 | Disposition: A | Discharge status: Home / Self Care | Payer: Medicare Other | Source: Ambulatory Visit | Attending: Physician Assistant | Admitting: Physician Assistant

## 2023-04-17 DIAGNOSIS — M5416 Radiculopathy, lumbar region: Secondary | ICD-10-CM

## 2023-04-17 MED ORDER — IOPAMIDOL (ISOVUE-M 200) INJECTION 41%
1.0000 mL | Freq: Once | INTRAMUSCULAR | Status: AC
  Administered 2023-04-17: 1 mL via EPIDURAL

## 2023-04-17 MED ORDER — METHYLPREDNISOLONE ACETATE 40 MG/ML INJ SUSP (RADIOLOG
80.0000 mg | Freq: Once | INTRAMUSCULAR | Status: AC
  Administered 2023-04-17: 80 mg via EPIDURAL

## 2023-04-19 ENCOUNTER — Encounter: Payer: Self-pay | Admitting: Physician Assistant

## 2023-04-20 MED ORDER — DULOXETINE HCL 30 MG PO CPEP: 30.0000 mg | ORAL_CAPSULE | Freq: Every day | ORAL | 1 refills | Status: DC

## 2023-04-30 ENCOUNTER — Encounter (INDEPENDENT_AMBULATORY_CARE_PROVIDER_SITE_OTHER): Payer: Self-pay

## 2023-05-03 ENCOUNTER — Encounter: Payer: Self-pay | Admitting: Physician Assistant

## 2023-05-04 MED ORDER — BUPROPION HCL ER (XL) 300 MG PO TB24: 300.0000 mg | ORAL_TABLET | Freq: Every day | ORAL | 1 refills | Status: DC

## 2023-05-06 ENCOUNTER — Encounter: Payer: Self-pay | Admitting: Physician Assistant

## 2023-05-08 ENCOUNTER — Encounter: Payer: Self-pay | Admitting: Physician Assistant

## 2023-06-02 ENCOUNTER — Encounter: Payer: Self-pay | Admitting: Physician Assistant

## 2023-06-02 DIAGNOSIS — M5416 Radiculopathy, lumbar region: Secondary | ICD-10-CM

## 2023-06-09 ENCOUNTER — Encounter: Payer: Self-pay | Admitting: Physician Assistant

## 2023-06-18 NOTE — Discharge Instructions (Signed)

## 2023-06-19 ENCOUNTER — Ambulatory Visit
Admission: RE | Admit: 2023-06-19 | Discharge: 2023-06-19 | Disposition: A | Discharge status: Home / Self Care | Payer: Medicare Other | Source: Ambulatory Visit | Attending: Physician Assistant | Admitting: Physician Assistant

## 2023-06-19 DIAGNOSIS — M5416 Radiculopathy, lumbar region: Secondary | ICD-10-CM

## 2023-06-19 MED ORDER — METHYLPREDNISOLONE ACETATE 40 MG/ML INJ SUSP (RADIOLOG
80.0000 mg | Freq: Once | INTRAMUSCULAR | Status: AC
  Administered 2023-06-19: 80 mg via EPIDURAL

## 2023-06-19 MED ORDER — IOPAMIDOL (ISOVUE-M 200) INJECTION 41%
1.0000 mL | Freq: Once | INTRAMUSCULAR | Status: AC
  Administered 2023-06-19: 1 mL via EPIDURAL

## 2023-06-22 ENCOUNTER — Ambulatory Visit: Payer: Medicare Other

## 2023-06-22 VITALS — Wt 152.0 lb

## 2023-06-22 DIAGNOSIS — Z0000 Encounter for general adult medical examination without abnormal findings: Secondary | ICD-10-CM

## 2023-06-22 NOTE — Patient Instructions (Signed)
Andrew Benitez , Thank you for taking time to come for your Medicare Wellness Visit. I appreciate your ongoing commitment to your health goals. Please review the following plan we discussed and let me know if I can assist you in the future.   Referrals/Orders/Follow-Ups/Clinician Recommendations: Aim for 30 minutes of exercise or brisk walking, 6-8 glasses of water, and 5 servings of fruits and vegetables each day. 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    This is a list of the screening recommended for you and due dates:  Health Maintenance  Topic Date Due   COVID-19 Vaccine (8 - 2023-24 season) 05/17/2023   Medicare Annual Wellness Visit  06/21/2024   DTaP/Tdap/Td vaccine (2 - Td or Tdap) 05/08/2026   Colon Cancer Screening  11/06/2026   Pneumonia Vaccine  Completed   Flu Shot  Completed   Hepatitis C Screening  Completed   Zoster (Shingles) Vaccine  Completed   HPV Vaccine  Aged Out    Advanced directives: (In Chart) A copy of your advanced directives are scanned into your chart should your provider ever need it.  Next Medicare Annual Wellness Visit scheduled for next year: Yes

## 2023-06-22 NOTE — Progress Notes (Addendum)
Subjective:   Andrew Benitez is a 72 y.o. male who presents for Medicare Annual/Subsequent preventive examination.  Visit Complete: Virtual  I connected with  Andrew Benitez on 06/22/23 by a audio enabled telemedicine application and verified that I am speaking with the correct person using two identifiers.  Patient Location: Home  Provider Location: Office/Clinic  I discussed the limitations of evaluation and management by telemedicine. The patient expressed understanding and agreed to proceed.  Patient Medicare AWV questionnaire was completed by the patient on 06/18/23; I have confirmed that all information answered by patient is correct and no changes since this date.  Cardiac Risk Factors include: advanced age (>58men, >1 women)     Objective:    Today's Vitals   06/22/23 1528  Weight: 152 lb (68.9 kg)   Body mass index is 21.2 kg/m.     06/22/2023    3:31 PM 10/14/2022    1:13 PM 05/30/2022    1:38 PM 10/06/2021    1:30 PM 08/21/2021   12:47 PM 05/29/2021    1:28 PM  Advanced Directives  Does Patient Have a Medical Advance Directive? Yes Yes Yes No No Yes  Type of Estate agent of [place];Living will  Healthcare Power of [place];Living will   Healthcare Power of [place];Living will  Does patient want to make changes to medical advance directive? No - Patient declined No - Patient declined No - Patient declined     Copy of Healthcare Power of Attorney in Chart? Yes - validated most recent copy scanned in chart (See row information)  Yes - validated most recent copy scanned in chart (See row information)   No - copy requested  Would patient like information on creating a medical advance directive?    No - Patient declined No - Patient declined     Current Medications (verified) Outpatient Encounter Medications as of 06/22/2023  Medication Sig   ARIPiprazole (ABILIFY) 5 MG tablet Take 0.5 tablets (2.5 mg total) by mouth daily.   buPROPion (WELLBUTRIN XL)  300 MG 24 hr tablet Take 1 tablet (300 mg total) by mouth daily.   DULoxetine (CYMBALTA) 30 MG capsule Take 1 capsule (30 mg total) by mouth daily.   ferrous sulfate 325 (65 FE) MG tablet Take 325 mg by mouth every other day.   FIBER PO Take 1 each by mouth in the morning and at bedtime. Tablespoon   finasteride (PROPECIA) 1 MG tablet TAKE 1 TABLET(1 MG) BY MOUTH DAILY   Multiple Vitamins-Minerals (PRESERVISION AREDS 2) CAPS Take 1 capsule by mouth 2 (two) times daily before a meal. With meals breakfast and dinner   vitamin C (ASCORBIC ACID) 500 MG tablet Take 500 mg by mouth daily.   No facility-administered encounter medications on file as of 06/22/2023.    Allergies (verified) Patient has no known allergies.   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)   Past Surgical History:  Procedure Laterality Date   BREAST SURGERY  02/24/1981   Gynecomastia; date is an estimate   CYSTOSCOPY WITH INSERTION OF UROLIFT  2018   MASTECTOMY FOR GYNECOMASTIA Left 1983   SKIN CANCER EXCISION  2018   right side of neck and left forehead   TONSILLECTOMY     Family History  Problem Relation Age of Onset   Alcohol abuse Mother  Arthritis Mother    COPD Mother    Vision loss Mother    Alcohol abuse Father    Depression Father    Early death Father    Breast cancer Sister    Alcohol abuse Sister    Depression Sister    Asthma Sister    Hyperlipidemia Sister    Heart attack Paternal Grandmother    Social History   Socioeconomic History   Marital status: Married    Spouse name: Not on file   Number of children: Not on file   Years of education: Not on file   Highest education level: Not on file  Occupational History   Not on file  Tobacco Use   Smoking status: Never   Smokeless tobacco: Never  Vaping Use   Vaping status: Never Used  Substance  and Sexual Activity   Alcohol use: Yes    Alcohol/week: 1.0 standard drink of alcohol    Types: 1 Cans of beer per week    Comment: one beer every  week   Drug use: Yes    Frequency: 2.0 times per week    Types: Marijuana    Comment: once a week   Sexual activity: Not Currently    Birth control/protection: None    Comment: Vasectomy  Other Topics Concern   Not on file  Social History Narrative   Married   Two children, one in [place]   Retired -- Therapist, sports   Social Determinants of Health   Financial Resource Strain: Low Risk  (06/18/2023)   Overall Financial Resource Strain (CARDIA)    Difficulty of Paying Living Expenses: Not hard at all  Food Insecurity: No Food Insecurity (06/18/2023)   Hunger Vital Sign    Worried About Running Out of Food in the Last Year: Never true    Ran Out of Food in the Last Year: Never true  Transportation Needs: No Transportation Needs (06/18/2023)   PRAPARE - Administrator, Civil Service (Medical): No    Lack of Transportation (Non-Medical): No  Physical Activity: Sufficiently Active (06/18/2023)   Exercise Vital Sign    Days of Exercise per Week: 5 days    Minutes of Exercise per Session: 40 min  Stress: No Stress Concern Present (06/18/2023)   [employer] of Occupational Health - Occupational Stress Questionnaire    Feeling of Stress : Not at all  Social Connections: Socially Isolated (06/18/2023)   Social Connection and Isolation Panel [NHANES]    Frequency of Communication with Friends and Family: Once a week    Frequency of Social Gatherings with Friends and Family: Once a week    Attends Religious Services: Never    Database administrator or Organizations: No    Attends Engineer, structural: Never    Marital Status: Married    Tobacco Counseling Counseling given: Not Answered   Clinical Intake:  Pre-visit preparation completed: Yes  Pain : No/denies pain     BMI - recorded:  21.2 Nutritional Status: BMI of 19-24  Normal Diabetes: No  How often do you need to have someone help you when you read instructions, pamphlets, or other written materials from your doctor or pharmacy?: 1 - Never  Interpreter Needed?: No  Information entered by :: Lanier Ensign, LPN   Activities of Daily Living    06/18/2023    2:48 PM 06/04/2023    5:56 PM  In your present state of health, do you have any difficulty performing the  following activities:  Hearing? 1 0  Comment HOH   Vision? 0 0  Difficulty concentrating or making decisions? 0 0  Walking or climbing stairs? 0 0  Dressing or bathing? 0 0  Doing errands, shopping? 0 0  Preparing Food and eating ? N N  Using the Toilet? N N  In the past six months, have you accidently leaked urine? N N  Do you have problems with loss of bowel control? N N  Managing your Medications? N N  Managing your Finances? N N  Housekeeping or managing your Housekeeping? N N    Patient Care Team: Jarold Motto, [place] as PCP - General (Physician Assistant)  Indicate any recent Medical Services you may have received from other than [facility] providers in the past year (date may be approximate).     Assessment:   This is a routine wellness examination for Nader.  Hearing/Vision screen Hearing Screening - Comments:: Pt stated HOH  Vision Screening - Comments:: Pt follows with Dr Rexene Edison @ [facility] for annual eye exams    Goals Addressed             This Visit's Progress    Patient Stated       Strengthen core muscles        Depression Screen    06/22/2023    3:31 PM 08/20/2022    1:16 PM 08/20/2022    1:15 PM 07/18/2022   11:12 AM 05/30/2022    1:37 PM 02/14/2022   10:20 AM 07/17/2021   10:48 AM  PHQ 2/9 Scores  PHQ - 2 Score 1 0 0 0 0 0 0  PHQ- 9 Score  0 0 3  1 1     Fall Risk    06/18/2023    2:48 PM 06/04/2023    5:56 PM 08/20/2022    1:16 PM 07/18/2022   11:11 AM 05/30/2022    1:39 PM  Fall Risk   Falls in the past year? 1 1 0 0  0  Number falls in past yr: 0 0 0 0 0  Injury with Fall? 0 0 0 0 0  Risk for fall due to : Impaired vision  No Fall Risks  No Fall Risks;Impaired vision;Impaired balance/gait  Follow up Falls prevention discussed   Falls evaluation completed Falls prevention discussed    MEDICARE RISK AT HOME: Medicare Risk at Home Any stairs in or around the home?: Yes If so, are there any without handrails?: Yes Home free of loose throw rugs in walkways, pet beds, electrical cords, etc?: Yes Adequate lighting in your home to reduce risk of falls?: Yes Life alert?: No Use of a cane, walker or w/c?: No Grab bars in the bathroom?: No Shower chair or bench in shower?: No Elevated toilet seat or a handicapped toilet?: No  TIMED UP AND GO:  Was the test performed?  No    Cognitive Function:        06/22/2023    3:33 PM 05/30/2022    1:40 PM 05/29/2021    1:32 PM  6CIT Screen  What Year? 0 points 0 points 0 points  What month? 0 points 0 points 0 points  What time? 0 points 0 points 0 points  Count back from 20 0 points 0 points 0 points  Months in reverse 0 points 0 points 0 points  Repeat phrase 0 points 0 points 0 points  Total Score 0 points 0 points 0 points    Immunizations Immunization History  Administered  Date(s) Administered   Fluad Quad(high Dose 65+) 07/06/2020   Influenza, High Dose Seasonal PF 05/12/2019, 06/19/2021, 06/02/2022, 05/08/2023   Moderna Sars-Covid-2 Vaccination 11/26/2022   PFIZER(Purple Top)SARS-COV-2 Vaccination 11/19/2019, 12/10/2019, 06/18/2020, 01/26/2021, 06/20/2022   Pfizer Covid-19 Vaccine Bivalent Booster 92yrs & up 06/26/2021   Pneumococcal Conjugate-13 05/11/2017   Pneumococcal Polysaccharide-23 06/24/2018   Respiratory Syncytial Virus Vaccine,Recomb Aduvanted(Arexvy) 09/10/2022   Tdap 05/08/2016   Zoster Recombinant(Shingrix) 09/29/2021, 01/27/2022    TDAP status: Up to date  Flu Vaccine status: Up to date  Pneumococcal vaccine status: Up to  date  Covid-19 vaccine status: Information provided on how to obtain vaccines.   Qualifies for Shingles Vaccine? Yes   Zostavax completed Yes   Shingrix Completed?: Yes  Screening Tests Health Maintenance  Topic Date Due   COVID-19 Vaccine (8 - 2023-24 season) 05/17/2023   Medicare Annual Wellness (AWV)  06/21/2024   DTaP/Tdap/Td (2 - Td or Tdap) 05/08/2026   Colonoscopy  11/06/2026   Pneumonia Vaccine 32+ Years old  Completed   INFLUENZA VACCINE  Completed   Hepatitis C Screening  Completed   Zoster Vaccines- Shingrix  Completed   HPV VACCINES  Aged Out    Health Maintenance  Health Maintenance Due  Topic Date Due   COVID-19 Vaccine (8 - 2023-24 season) 05/17/2023    Colorectal cancer screening: Type of screening: Colonoscopy. Completed 11/06/16. Repeat every 10 years  Additional Screening:  Hepatitis C Screening: Completed 07/06/20  Vision Screening: Recommended annual ophthalmology exams for early detection of glaucoma and other disorders of the eye. Is the patient up to date with their annual eye exam?  Yes  Who is the provider or what is the name of the office in which the patient attends annual eye exams? Dr Rexene Edison  If pt is not established with a provider, would they like to be referred to a provider to establish care? No .   Dental Screening: Recommended annual dental exams for proper oral hygiene  Community Resource Referral / Chronic Care Management: CRR required this visit?  No   CCM required this visit?  No     Plan:     I have personally reviewed and noted the following in the patient's chart:   Medical and social history Use of alcohol, tobacco or illicit drugs  Current medications and supplements including opioid prescriptions. Patient is not currently taking opioid prescriptions. Functional ability and status Nutritional status Physical activity Advanced directives List of other physicians Hospitalizations, surgeries, and ER visits in previous  12 months Vitals Screenings to include cognitive, depression, and falls Referrals and appointments  In addition, I have reviewed and discussed with patient certain preventive protocols, quality metrics, and best practice recommendations. A written personalized care plan for preventive services as well as general preventive health recommendations were provided to patient.     Marzella Schlein, LPN   16/09/958   After Visit Summary: (MyChart) Due to this being a telephonic visit, the after visit summary with patients personalized plan was offered to patient via MyChart   Nurse Notes: none

## 2023-06-25 ENCOUNTER — Encounter: Payer: Self-pay | Admitting: Physician Assistant

## 2023-06-25 NOTE — Progress Notes (Signed)
Subjective:   Andrew Benitez is a 72 y.o. male who presents for Medicare Annual/Subsequent preventive examination.  Visit Complete: Virtual  I connected with  Andrew Benitez on 06/25/23 by a audio enabled telemedicine application and verified that I am speaking with the correct person using two identifiers.  Patient Location: Home  Provider Location: Office/Clinic  I discussed the limitations of evaluation and management by telemedicine. The patient expressed understanding and agreed to proceed.  Patient Medicare AWV questionnaire was completed by the patient on 06/18/23; I have confirmed that all information answered by patient is correct and no changes since this date.  Because this visit was a virtual/telehealth visit, some criteria may be missing or patient reported. Any vitals not documented were not able to be obtained and vitals that have been documented are patient reported.    Cardiac Risk Factors include: advanced age (>38men, >41 women)     Objective:    Today's Vitals   06/22/23 1528  Weight: 152 lb (68.9 kg)   Body mass index is 21.2 kg/m.     06/22/2023    3:31 PM 10/14/2022    1:13 PM 05/30/2022    1:38 PM 10/06/2021    1:30 PM 08/21/2021   12:47 PM 05/29/2021    1:28 PM  Advanced Directives  Does Patient Have a Medical Advance Directive? Yes Yes Yes No No Yes  Type of Estate agent of [place];Living will  Healthcare Power of [place];Living will   Healthcare Power of [place];Living will  Does patient want to make changes to medical advance directive? No - Patient declined No - Patient declined No - Patient declined     Copy of Healthcare Power of Attorney in Chart? Yes - validated most recent copy scanned in chart (See row information)  Yes - validated most recent copy scanned in chart (See row information)   No - copy requested  Would patient like information on creating a medical advance directive?    No - Patient declined No - Patient  declined     Current Medications (verified) Outpatient Encounter Medications as of 06/22/2023  Medication Sig   ARIPiprazole (ABILIFY) 5 MG tablet Take 0.5 tablets (2.5 mg total) by mouth daily.   buPROPion (WELLBUTRIN XL) 300 MG 24 hr tablet Take 1 tablet (300 mg total) by mouth daily.   DULoxetine (CYMBALTA) 30 MG capsule Take 1 capsule (30 mg total) by mouth daily.   ferrous sulfate 325 (65 FE) MG tablet Take 325 mg by mouth every other day.   FIBER PO Take 1 each by mouth in the morning and at bedtime. Tablespoon   finasteride (PROPECIA) 1 MG tablet TAKE 1 TABLET(1 MG) BY MOUTH DAILY   Multiple Vitamins-Minerals (PRESERVISION AREDS 2) CAPS Take 1 capsule by mouth 2 (two) times daily before a meal. With meals breakfast and dinner   vitamin C (ASCORBIC ACID) 500 MG tablet Take 500 mg by mouth daily.   No facility-administered encounter medications on file as of 06/22/2023.    Allergies (verified) Patient has no known allergies.   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)   Past Surgical History:  Procedure Laterality Date   BREAST SURGERY  02/24/1981   Gynecomastia; date is an estimate   CYSTOSCOPY WITH INSERTION OF UROLIFT  2018   MASTECTOMY FOR GYNECOMASTIA Left 1983  SKIN CANCER EXCISION  2018   right side of neck and left forehead   TONSILLECTOMY     Family History  Problem Relation Age of Onset   Alcohol abuse Mother    Arthritis Mother    COPD Mother    Vision loss Mother    Alcohol abuse Father    Depression Father    Early death Father    Breast cancer Sister    Alcohol abuse Sister    Depression Sister    Asthma Sister    Hyperlipidemia Sister    Heart attack Paternal Grandmother    Social History   Socioeconomic History   Marital status: Married    Spouse name: Not on file   Number of children: Not on file    Years of education: Not on file   Highest education level: Not on file  Occupational History   Not on file  Tobacco Use   Smoking status: Never   Smokeless tobacco: Never  Vaping Use   Vaping status: Never Used  Substance and Sexual Activity   Alcohol use: Yes    Alcohol/week: 1.0 standard drink of alcohol    Types: 1 Cans of beer per week    Comment: one beer every  week   Drug use: Yes    Frequency: 2.0 times per week    Types: Marijuana    Comment: once a week   Sexual activity: Not Currently    Birth control/protection: None    Comment: Vasectomy  Other Topics Concern   Not on file  Social History Narrative   Married   Two children, one in [place]   Retired -- Therapist, sports   Social Determinants of Health   Financial Resource Strain: Low Risk  (06/18/2023)   Overall Financial Resource Strain (CARDIA)    Difficulty of Paying Living Expenses: Not hard at all  Food Insecurity: No Food Insecurity (06/18/2023)   Hunger Vital Sign    Worried About Running Out of Food in the Last Year: Never true    Ran Out of Food in the Last Year: Never true  Transportation Needs: No Transportation Needs (06/18/2023)   PRAPARE - Administrator, Civil Service (Medical): No    Lack of Transportation (Non-Medical): No  Physical Activity: Sufficiently Active (06/18/2023)   Exercise Vital Sign    Days of Exercise per Week: 5 days    Minutes of Exercise per Session: 40 min  Stress: No Stress Concern Present (06/18/2023)   [employer] of Occupational Health - Occupational Stress Questionnaire    Feeling of Stress : Not at all  Social Connections: Socially Isolated (06/18/2023)   Social Connection and Isolation Panel [NHANES]    Frequency of Communication with Friends and Family: Once a week    Frequency of Social Gatherings with Friends and Family: Once a week    Attends Religious Services: Never    Database administrator or Organizations: No    Attends Museum/gallery exhibitions officer: Never    Marital Status: Married    Tobacco Counseling Counseling given: Not Answered   Clinical Intake:  Pre-visit preparation completed: Yes  Pain : No/denies pain     BMI - recorded: 21.2 Nutritional Status: BMI of 19-24  Normal Diabetes: No  How often do you need to have someone help you when you read instructions, pamphlets, or other written materials from your doctor or pharmacy?: 1 - Never  Interpreter Needed?: No  Information entered by :: [employer]  Andrew Mees, LPN   Activities of Daily Living    06/18/2023    2:48 PM 06/04/2023    5:56 PM  In your present state of health, do you have any difficulty performing the following activities:  Hearing? 1 0  Comment HOH   Vision? 0 0  Difficulty concentrating or making decisions? 0 0  Walking or climbing stairs? 0 0  Dressing or bathing? 0 0  Doing errands, shopping? 0 0  Preparing Food and eating ? N N  Using the Toilet? N N  In the past six months, have you accidently leaked urine? N N  Do you have problems with loss of bowel control? N N  Managing your Medications? N N  Managing your Finances? N N  Housekeeping or managing your Housekeeping? N N    Patient Care Team: Jarold Motto, [place] as PCP - General (Physician Assistant)  Indicate any recent Medical Services you may have received from other than [facility] providers in the past year (date may be approximate).     Assessment:   This is a routine wellness examination for Andrew Benitez.  Hearing/Vision screen Hearing Screening - Comments:: Pt stated HOH  Vision Screening - Comments:: Pt follows with Dr Rexene Edison @ [facility] for annual eye exams    Goals Addressed             This Visit's Progress    Patient Stated       Strengthen core muscles        Depression Screen    06/22/2023    3:31 PM 08/20/2022    1:16 PM 08/20/2022    1:15 PM 07/18/2022   11:12 AM 05/30/2022    1:37 PM 02/14/2022   10:20 AM 07/17/2021   10:48 AM  PHQ 2/9 Scores  PHQ -  2 Score 1 0 0 0 0 0 0  PHQ- 9 Score  0 0 3  1 1     Fall Risk    06/18/2023    2:48 PM 06/04/2023    5:56 PM 08/20/2022    1:16 PM 07/18/2022   11:11 AM 05/30/2022    1:39 PM  Fall Risk   Falls in the past year? 1 1 0 0 0  Number falls in past yr: 0 0 0 0 0  Injury with Fall? 0 0 0 0 0  Risk for fall due to : Impaired vision  No Fall Risks  No Fall Risks;Impaired vision;Impaired balance/gait  Follow up Falls prevention discussed   Falls evaluation completed Falls prevention discussed    MEDICARE RISK AT HOME: Medicare Risk at Home Any stairs in or around the home?: Yes If so, are there any without handrails?: Yes Home free of loose throw rugs in walkways, pet beds, electrical cords, etc?: Yes Adequate lighting in your home to reduce risk of falls?: Yes Life alert?: No Use of a cane, walker or w/c?: No Grab bars in the bathroom?: No Shower chair or bench in shower?: No Elevated toilet seat or a handicapped toilet?: No  TIMED UP AND GO:  Was the test performed?  No    Cognitive Function:        06/22/2023    3:33 PM 05/30/2022    1:40 PM 05/29/2021    1:32 PM  6CIT Screen  What Year? 0 points 0 points 0 points  What month? 0 points 0 points 0 points  What time? 0 points 0 points 0 points  Count back from 20 0 points 0 points 0  points  Months in reverse 0 points 0 points 0 points  Repeat phrase 0 points 0 points 0 points  Total Score 0 points 0 points 0 points    Immunizations Immunization History  Administered Date(s) Administered   Fluad Quad(high Dose 65+) 07/06/2020   Influenza, High Dose Seasonal PF 05/12/2019, 06/19/2021, 06/02/2022, 05/08/2023   Moderna Sars-Covid-2 Vaccination 11/26/2022   PFIZER(Purple Top)SARS-COV-2 Vaccination 11/19/2019, 12/10/2019, 06/18/2020, 01/26/2021, 06/20/2022   Pfizer Covid-19 Vaccine Bivalent Booster 57yrs & up 06/26/2021   Pneumococcal Conjugate-13 05/11/2017   Pneumococcal Polysaccharide-23 06/24/2018   Respiratory Syncytial  Virus Vaccine,Recomb Aduvanted(Arexvy) 09/10/2022   Tdap 05/08/2016   Zoster Recombinant(Shingrix) 09/29/2021, 01/27/2022    TDAP status: Up to date  Flu Vaccine status: Up to date  Pneumococcal vaccine status: Up to date  Covid-19 vaccine status: Information provided on how to obtain vaccines.   Qualifies for Shingles Vaccine? Yes   Zostavax completed Yes   Shingrix Completed?: Yes  Screening Tests Health Maintenance  Topic Date Due   COVID-19 Vaccine (8 - 2023-24 season) 05/17/2023   Medicare Annual Wellness (AWV)  06/21/2024   DTaP/Tdap/Td (2 - Td or Tdap) 05/08/2026   Colonoscopy  11/06/2026   Pneumonia Vaccine 83+ Years old  Completed   INFLUENZA VACCINE  Completed   Hepatitis C Screening  Completed   Zoster Vaccines- Shingrix  Completed   HPV VACCINES  Aged Out    Health Maintenance  Health Maintenance Due  Topic Date Due   COVID-19 Vaccine (8 - 2023-24 season) 05/17/2023    Colorectal cancer screening: Type of screening: Colonoscopy. Completed 11/06/16. Repeat every 10 years  Additional Screening:  Hepatitis C Screening: Completed 07/06/20  Vision Screening: Recommended annual ophthalmology exams for early detection of glaucoma and other disorders of the eye. Is the patient up to date with their annual eye exam?  Yes  Who is the provider or what is the name of the office in which the patient attends annual eye exams? Dr Rexene Edison  If pt is not established with a provider, would they like to be referred to a provider to establish care? No .   Dental Screening: Recommended annual dental exams for proper oral hygiene  Community Resource Referral / Chronic Care Management: CRR required this visit?  No   CCM required this visit?  No     Plan:     I have personally reviewed and noted the following in the patient's chart:   Medical and social history Use of alcohol, tobacco or illicit drugs  Current medications and supplements including opioid prescriptions.  Patient is not currently taking opioid prescriptions. Functional ability and status Nutritional status Physical activity Advanced directives List of other physicians Hospitalizations, surgeries, and ER visits in previous 12 months Vitals Screenings to include cognitive, depression, and falls Referrals and appointments  In addition, I have reviewed and discussed with patient certain preventive protocols, quality metrics, and best practice recommendations. A written personalized care plan for preventive services as well as general preventive health recommendations were provided to patient.     Marzella Schlein, LPN   16/06/9603   After Visit Summary: (MyChart) Due to this being a telephonic visit, the after visit summary with patients personalized plan was offered to patient via MyChart   Nurse Notes: none

## 2023-07-20 ENCOUNTER — Encounter: Payer: Self-pay | Admitting: Physician Assistant

## 2023-07-20 ENCOUNTER — Ambulatory Visit: Payer: Medicare Other | Admitting: Physician Assistant

## 2023-07-20 VITALS — BP 110/70 | HR 60 | Temp 97.3°F | Ht 71.0 in | Wt 158.4 lb

## 2023-07-20 DIAGNOSIS — F329 Major depressive disorder, single episode, unspecified: Secondary | ICD-10-CM

## 2023-07-20 DIAGNOSIS — R351 Nocturia: Secondary | ICD-10-CM | POA: Diagnosis not present

## 2023-07-20 DIAGNOSIS — H353122 Nonexudative age-related macular degeneration, left eye, intermediate dry stage: Secondary | ICD-10-CM

## 2023-07-20 DIAGNOSIS — H9193 Unspecified hearing loss, bilateral: Secondary | ICD-10-CM | POA: Diagnosis not present

## 2023-07-20 DIAGNOSIS — M5416 Radiculopathy, lumbar region: Secondary | ICD-10-CM

## 2023-07-20 DIAGNOSIS — Z0000 Encounter for general adult medical examination without abnormal findings: Secondary | ICD-10-CM

## 2023-07-20 DIAGNOSIS — R790 Abnormal level of blood mineral: Secondary | ICD-10-CM | POA: Diagnosis not present

## 2023-07-20 DIAGNOSIS — Z79899 Other long term (current) drug therapy: Secondary | ICD-10-CM

## 2023-07-20 DIAGNOSIS — L659 Nonscarring hair loss, unspecified: Secondary | ICD-10-CM

## 2023-07-20 LAB — CBC WITH DIFFERENTIAL/PLATELET
Basophils Absolute: 0 10*3/uL (ref 0.0–0.1)
Basophils Relative: 0.7 % (ref 0.0–3.0)
Eosinophils Absolute: 0.1 10*3/uL (ref 0.0–0.7)
Eosinophils Relative: 1.7 % (ref 0.0–5.0)
HCT: 41.5 % (ref 39.0–52.0)
Hemoglobin: 13.4 g/dL (ref 13.0–17.0)
Lymphocytes Relative: 41.8 % (ref 12.0–46.0)
Lymphs Abs: 2.3 10*3/uL (ref 0.7–4.0)
MCHC: 32.4 g/dL (ref 30.0–36.0)
MCV: 88.3 fL (ref 78.0–100.0)
Monocytes Absolute: 0.8 10*3/uL (ref 0.1–1.0)
Monocytes Relative: 13.9 % — ABNORMAL HIGH (ref 3.0–12.0)
Neutro Abs: 2.3 10*3/uL (ref 1.4–7.7)
Neutrophils Relative %: 41.9 % — ABNORMAL LOW (ref 43.0–77.0)
Platelets: 239 10*3/uL (ref 150.0–400.0)
RBC: 4.7 Mil/uL (ref 4.22–5.81)
RDW: 14 % (ref 11.5–15.5)
WBC: 5.4 10*3/uL (ref 4.0–10.5)

## 2023-07-20 LAB — COMPREHENSIVE METABOLIC PANEL
ALT: 25 U/L (ref 0–53)
AST: 26 U/L (ref 0–37)
Albumin: 4.2 g/dL (ref 3.5–5.2)
Alkaline Phosphatase: 63 U/L (ref 39–117)
BUN: 15 mg/dL (ref 6–23)
CO2: 30 meq/L (ref 19–32)
Calcium: 9.3 mg/dL (ref 8.4–10.5)
Chloride: 104 meq/L (ref 96–112)
Creatinine, Ser: 0.85 mg/dL (ref 0.40–1.50)
GFR: 86.86 mL/min (ref >60.00)
Glucose, Bld: 82 mg/dL (ref 70–99)
Potassium: 4.8 meq/L (ref 3.5–5.1)
Sodium: 140 meq/L (ref 135–145)
Total Bilirubin: 0.4 mg/dL (ref 0.2–1.2)
Total Protein: 6.8 g/dL (ref 6.0–8.3)

## 2023-07-20 LAB — LIPID PANEL
Cholesterol: 157 mg/dL (ref 0–200)
HDL: 46.7 mg/dL (ref >39.00)
LDL Cholesterol: 97 mg/dL (ref 0–99)
NonHDL: 110.6
Total CHOL/HDL Ratio: 3
Triglycerides: 69 mg/dL (ref 0.0–149.0)
VLDL: 13.8 mg/dL (ref 0.0–40.0)

## 2023-07-20 LAB — IBC + FERRITIN
Ferritin: 42.5 ng/mL (ref 22.0–322.0)
Iron: 83 ug/dL (ref 42–165)
Saturation Ratios: 23.4 % (ref 20.0–50.0)
TIBC: 354.2 ug/dL (ref 250.0–450.0)
Transferrin: 253 mg/dL (ref 212.0–360.0)

## 2023-07-20 LAB — PSA: PSA: 0.48 ng/mL (ref 0.10–4.00)

## 2023-07-20 NOTE — Progress Notes (Signed)
Subjective:    Andrew Benitez is a 72 y.o. male and is here for a comprehensive physical exam.  HPI  There are no preventive care reminders to display for this patient.   Acute Concerns: Hearing Difficulty She states that his wife always complains that he's unable to hear her. He denies having to turn up his TV. He is requesting to check his hearing today. He does complain of occasional high pitched, high frequency sound in both his ears. 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  Denies any headaches, tingling, or weakness.  Skin Bump  He complains of a bump located in his upper chest region. He denies any accompanying pain. He does report having a history of skin cancer in two regions, one in his face and one around his neck. He reports following up with dermatology regularly   Chronic Issues: Depression He is currently compliant with taking Cymbalta 30 mg daily, Wellbutrin XL 300 mg daily, and Abilify 2.5 mg daily with no adverse effects. Pt expresses that this medication has been beneficial to him. At this time he is tolerating well. Denies SI/HI.   Lower Back Pain He states that his back pain is improved with his physical therapy and epidural injections every 8 weeks. He is planning on decreasing his injection frequency   He does report occasional numbness on his right hand and left neck pain. He is also complaining of occasional rotator cuff pain. He is following up with a specialist to discuss undergoing a back surgery for his severe arthritis   Iron   He reports compliance and good tolerance of iron supplements He states that he can go up to 5 days without having a BM when traveling. He does report taking fiber to help keep his symptoms managed.    Macular Degeneration No complains at this time. He continues to follow up with a specialist.  Nocturia Experiences need for frequent urination at night on average around 2-3 times  per night  Health Maintenance: Immunizations -- n/a Colonoscopy -- n/a PSA --  Lab Results  Component Value Date   PSA 0.44 07/18/2022   PSA 1.37 07/17/2021   PSA 0.51 07/06/2020   Diet -- normal, controlled diet Sleep habits -- no complains  Exercise -- daily exercise and few gym visits a week.  Weight -- @FLOWAMB (14)@  Recent weight history Wt Readings from Last 10 Encounters:  07/20/23 158 lb 6.1 oz (71.8 kg)  06/22/23 152 lb (68.9 kg)  01/14/23 158 lb (71.7 kg)  08/20/22 157 lb 6.4 oz (71.4 kg)  03/26/22 153 lb (69.4 kg)  02/24/22 155 lb 9.6 oz (70.6 kg)  02/14/22 154 lb (69.9 kg)  01/06/22 157 lb 4 oz (71.3 kg)  12/12/21 157 lb (71.2 kg)  10/16/21 157 lb (71.2 kg)   Body mass index is 22.09 kg/m.  Mood -- stable Alcohol use --  reports current alcohol use of about 1.0 standard drink of alcohol per week.  Tobacco use --  Tobacco Use: Low Risk  (07/20/2023)   Patient History    Smoking Tobacco Use: Never    Smokeless Tobacco Use: Never    Passive Exposure: Not on file    Eligible for Low Dose CT? no  UTD with eye doctor? yes UTD with dentist? yes     07/20/2023    9:11 AM  Depression screen PHQ 2/9  Decreased Interest 0  Down, Depressed, Hopeless 0  PHQ - 2 Score 0  Altered sleeping 0  Tired, decreased energy 0  Change in appetite 0  Feeling bad or failure about yourself  0  Trouble concentrating 0  Moving slowly or fidgety/restless 0  Suicidal thoughts 0  PHQ-9 Score 0  Difficult doing work/chores Not difficult at all    Other providers/specialists: Patient Care Team: Jarold Motto, [place] as PCP - General (Physician Assistant)    PMHx, SurgHx, SocialHx, Medications, and Allergies were reviewed in the Visit Navigator and updated as appropriate.   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)     Past Surgical  History:  Procedure Laterality Date   BREAST SURGERY  02/24/1981   Gynecomastia; date is an estimate   CYSTOSCOPY WITH INSERTION OF UROLIFT  2018   MASTECTOMY FOR GYNECOMASTIA Left 1983   SKIN CANCER EXCISION  2018   right side of neck and left forehead   TONSILLECTOMY       Family History  Problem Relation Age of Onset   Alcohol abuse Mother    Arthritis Mother    COPD Mother    Vision loss Mother    Alcohol abuse Father    Depression Father    Early death Father    Breast cancer Sister    Alcohol abuse Sister    Depression Sister    Asthma Sister    Hyperlipidemia Sister    Heart attack Paternal Grandmother     Social History   Tobacco Use   Smoking status: Never   Smokeless tobacco: Never  Vaping Use   Vaping status: Never Used  Substance Use Topics   Alcohol use: Yes    Alcohol/week: 1.0 standard drink of alcohol    Types: 1 Cans of beer per week    Comment: one beer every week   Drug use: Yes    Frequency: 2.0 times per week    Types: Marijuana    Comment: once a week    Review of Systems:   Review of Systems  Constitutional:  Negative for chills, fever, malaise/fatigue and weight loss.  HENT:  Negative for hearing loss, sinus pain and sore throat.   Respiratory:  Negative for cough and hemoptysis.   Cardiovascular:  Negative for chest pain, palpitations, leg swelling and PND.  Gastrointestinal:  Negative for abdominal pain, constipation, diarrhea, heartburn, nausea and vomiting.  Genitourinary:  Negative for dysuria, frequency and urgency.  Musculoskeletal:  Negative for back pain, myalgias and neck pain.  Skin:  Negative for itching and rash.  Neurological:  Negative for dizziness, tingling, seizures and headaches.  Endo/Heme/Allergies:  Negative for polydipsia.  Psychiatric/Behavioral:  Negative for depression. The patient is not nervous/anxious.     Objective:    Vitals:   07/20/23 0905  BP: 110/70  Pulse: 60  Temp: (!) 97.3 F (36.3 C)   SpO2: 96%    Body mass index is 22.09 kg/m.  General  Alert, cooperative, no distress, appears stated age  Head:  Normocephalic, without obvious abnormality, atraumatic  Eyes:  PERRL, conjunctiva/corneas clear, EOM's intact, fundi benign, both eyes       Ears:  Normal TM's and external ear canals, both ears  Nose: Nares normal, septum midline, mucosa normal, no drainage or sinus tenderness  Throat: Lips, mucosa, and tongue normal; teeth and gums normal  Neck: Supple, symmetrical, trachea midline, no adenopathy;  thyroid:  No enlargement/tenderness/nodules; no carotid bruit or JVD  Back:   Symmetric, no curvature, ROM normal, no CVA tenderness  Lungs:   Clear to auscultation bilaterally, respirations unlabored  Chest wall:  No tenderness or deformity  Heart:  Regular rate and rhythm, S1 and S2 normal, no murmur, rub or gallop  Abdomen:   Soft, non-tender, bowel sounds active all four quadrants, no masses, no organomegaly  Extremities: Extremities normal, atraumatic, no cyanosis or edema  Prostate : Deferred   Skin: Skin color, texture, turgor normal, no rashes Raised lesion to central chest approx 1-2 mm with crusting  Lymph nodes: Cervical, supraclavicular, and axillary nodes normal  Neurologic: CNII-XII grossly intact. Normal strength, sensation and reflexes throughout   AssessmentPlan:   Routine physical examination Today patient counseled on age appropriate routine health concerns for screening and prevention, each reviewed and up to date or declined. Immunizations reviewed and up to date or declined. Labs ordered and reviewed. Risk factors for depression reviewed and negative. Hearing function and visual acuity are intact. ADLs screened and addressed as needed. Functional ability and level of safety reviewed and appropriate. Education, counseling and referrals performed based on assessed risks today. Patient provided with a copy of personalized plan for preventive  services.  Hearing difficulty of both ears Failed hearing screening Referral to [facility]  Lumbar radiculopathy Continue management with epidurals, physical therapy and neurosurgery No red flag sx  High risk medication use Update blood work to assess for lipid panel and other abnormalities  Nocturia Update PSA  Low ferritin Update iron panel and advise on iron supplementation   Hair loss Continue finasteride 1 mg daily; feels this is beneificial  Nonexudative age-related macular degeneration, left eye, intermediate dry stage Stable per patient; management per specialist  Major depressive disorder, remission status unspecified, unspecified whether recurrent Stable Continue Cymbalta 30 mg daily, Wellbutrin XL 300 mg daily, and Abilify 2.5 mg daily Denies SI/HI Follow-up in 1 year, sooner if concerns    Jarold Motto, PA-C [place]   I,Safa M Engineer, manufacturing systems as a Neurosurgeon for [employer], PA.,have documented all relevant documentation on the behalf of Jarold Motto, PA,as directed by  Jarold Motto, PA while in the presence of Jarold Motto, [place].   I, Jarold Motto, [place], have reviewed all documentation for this visit. The documentation on 07/20/23 for the exam, diagnosis, procedures, and orders are all accurate and complete.

## 2023-07-20 NOTE — Patient Instructions (Signed)
It was great to see you! ? ?Please go to the lab for blood work.  ? ?Our office will call you with your results unless you have chosen to receive results via MyChart. ? ?If your blood work is normal we will follow-up each year for physicals and as scheduled for chronic medical problems. ? ?If anything is abnormal we will treat accordingly and get you in for a follow-up. ? ?Take care, ? ?Jerrin Recore ?  ? ? ?

## 2023-08-12 ENCOUNTER — Encounter: Payer: Self-pay | Admitting: Physician Assistant

## 2023-08-12 MED ORDER — BUPROPION HCL ER (XL) 300 MG PO TB24: 300.0000 mg | ORAL_TABLET | Freq: Every day | ORAL | 1 refills | Status: DC

## 2023-08-16 ENCOUNTER — Encounter: Payer: Self-pay | Admitting: Physician Assistant

## 2023-08-16 DIAGNOSIS — M5416 Radiculopathy, lumbar region: Secondary | ICD-10-CM

## 2023-08-23 ENCOUNTER — Other Ambulatory Visit: Payer: Self-pay | Admitting: Medical Genetics

## 2023-08-24 ENCOUNTER — Encounter: Payer: Self-pay | Admitting: Physician Assistant

## 2023-08-24 ENCOUNTER — Other Ambulatory Visit (HOSPITAL_COMMUNITY)
Admission: RE | Admit: 2023-08-24 | Discharge: 2023-08-24 | Disposition: A | Discharge status: Home / Self Care | Payer: Medicare Other | Source: Ambulatory Visit | Attending: Medical Genetics | Admitting: Medical Genetics

## 2023-08-26 NOTE — Discharge Instructions (Signed)

## 2023-08-27 ENCOUNTER — Ambulatory Visit
Admission: RE | Admit: 2023-08-27 | Discharge: 2023-08-27 | Disposition: A | Discharge status: Home / Self Care | Payer: Medicare Other | Source: Ambulatory Visit | Attending: Physician Assistant | Admitting: Physician Assistant

## 2023-08-27 DIAGNOSIS — M5416 Radiculopathy, lumbar region: Secondary | ICD-10-CM

## 2023-08-27 MED ORDER — METHYLPREDNISOLONE ACETATE 40 MG/ML INJ SUSP (RADIOLOG
80.0000 mg | Freq: Once | INTRAMUSCULAR | Status: AC
  Administered 2023-08-27: 80 mg via EPIDURAL

## 2023-08-27 MED ORDER — IOPAMIDOL (ISOVUE-M 200) INJECTION 41%
1.0000 mL | Freq: Once | INTRAMUSCULAR | Status: AC
Start: 2023-08-27 — End: 2023-08-27
  Administered 2023-08-27: 1 mL via EPIDURAL

## 2023-08-31 ENCOUNTER — Other Ambulatory Visit: Payer: Medicare Other

## 2023-09-05 LAB — GENECONNECT MOLECULAR SCREEN: Genetic Analysis Overall Interpretation: NEGATIVE

## 2023-09-15 ENCOUNTER — Ambulatory Visit: Payer: Medicare Other | Admitting: Physician Assistant

## 2023-09-25 ENCOUNTER — Encounter: Payer: Self-pay | Admitting: Physician Assistant

## 2023-09-25 ENCOUNTER — Other Ambulatory Visit: Payer: Self-pay

## 2023-09-25 MED ORDER — ARIPIPRAZOLE 5 MG PO TABS: 2.5000 mg | ORAL_TABLET | Freq: Every day | ORAL | 1 refills | Status: DC

## 2023-10-23 ENCOUNTER — Encounter: Payer: Self-pay | Admitting: Physician Assistant

## 2023-10-23 ENCOUNTER — Other Ambulatory Visit: Payer: Self-pay | Admitting: Physician Assistant

## 2023-10-26 MED ORDER — DULOXETINE HCL 30 MG PO CPEP: 30.0000 mg | ORAL_CAPSULE | Freq: Every day | ORAL | 1 refills | Status: DC

## 2023-10-28 ENCOUNTER — Encounter: Payer: Self-pay | Admitting: Physician Assistant

## 2023-10-29 ENCOUNTER — Other Ambulatory Visit: Payer: Self-pay | Admitting: Physician Assistant

## 2023-10-29 DIAGNOSIS — M48062 Spinal stenosis, lumbar region with neurogenic claudication: Secondary | ICD-10-CM

## 2023-10-29 DIAGNOSIS — M5416 Radiculopathy, lumbar region: Secondary | ICD-10-CM

## 2023-10-30 NOTE — Telephone Encounter (Signed)
Left message for Jodelle Green to call me back about pt.

## 2023-11-02 ENCOUNTER — Other Ambulatory Visit: Payer: Self-pay | Admitting: *Deleted

## 2023-11-02 DIAGNOSIS — M5416 Radiculopathy, lumbar region: Secondary | ICD-10-CM

## 2023-11-02 DIAGNOSIS — M48062 Spinal stenosis, lumbar region with neurogenic claudication: Secondary | ICD-10-CM

## 2023-11-14 ENCOUNTER — Encounter: Payer: Self-pay | Admitting: Physician Assistant

## 2023-11-16 MED ORDER — BUPROPION HCL ER (XL) 300 MG PO TB24: 300.0000 mg | ORAL_TABLET | Freq: Every day | ORAL | 1 refills | Status: DC

## 2023-11-19 NOTE — Discharge Instructions (Signed)

## 2023-11-20 ENCOUNTER — Ambulatory Visit
Admission: RE | Admit: 2023-11-20 | Discharge: 2023-11-20 | Disposition: A | Discharge status: Home / Self Care | Payer: Medicare Other | Source: Ambulatory Visit | Attending: Physician Assistant | Admitting: Physician Assistant

## 2023-11-20 DIAGNOSIS — M48062 Spinal stenosis, lumbar region with neurogenic claudication: Secondary | ICD-10-CM

## 2023-11-20 DIAGNOSIS — M5416 Radiculopathy, lumbar region: Secondary | ICD-10-CM

## 2023-11-20 MED ORDER — METHYLPREDNISOLONE ACETATE 40 MG/ML INJ SUSP (RADIOLOG
80.0000 mg | Freq: Once | INTRAMUSCULAR | Status: AC
  Administered 2023-11-20: 80 mg via EPIDURAL

## 2023-11-20 MED ORDER — IOPAMIDOL (ISOVUE-M 200) INJECTION 41%
1.0000 mL | Freq: Once | INTRAMUSCULAR | Status: AC
  Administered 2023-11-20: 1 mL via EPIDURAL

## 2023-12-27 ENCOUNTER — Other Ambulatory Visit: Payer: Self-pay | Admitting: Physician Assistant

## 2023-12-27 ENCOUNTER — Encounter: Payer: Self-pay | Admitting: Physician Assistant

## 2023-12-28 MED ORDER — ARIPIPRAZOLE 5 MG PO TABS: 2.5000 mg | ORAL_TABLET | Freq: Every day | ORAL | 1 refills | Status: DC

## 2024-01-17 ENCOUNTER — Encounter: Payer: Self-pay | Admitting: Physician Assistant

## 2024-01-17 DIAGNOSIS — M48062 Spinal stenosis, lumbar region with neurogenic claudication: Secondary | ICD-10-CM

## 2024-01-17 DIAGNOSIS — M5416 Radiculopathy, lumbar region: Secondary | ICD-10-CM

## 2024-01-21 ENCOUNTER — Other Ambulatory Visit: Payer: Self-pay | Admitting: Physician Assistant

## 2024-01-21 ENCOUNTER — Encounter: Payer: Self-pay | Admitting: Physician Assistant

## 2024-01-27 NOTE — Discharge Instructions (Signed)

## 2024-01-28 ENCOUNTER — Ambulatory Visit
Admission: RE | Admit: 2024-01-28 | Discharge: 2024-01-28 | Disposition: A | Discharge status: Home / Self Care | Source: Ambulatory Visit | Attending: Physician Assistant | Admitting: Physician Assistant

## 2024-01-28 DIAGNOSIS — M5416 Radiculopathy, lumbar region: Secondary | ICD-10-CM

## 2024-01-28 DIAGNOSIS — M48062 Spinal stenosis, lumbar region with neurogenic claudication: Secondary | ICD-10-CM

## 2024-01-28 MED ORDER — IOPAMIDOL (ISOVUE-M 200) INJECTION 41%
1.0000 mL | Freq: Once | INTRAMUSCULAR | Status: AC
  Administered 2024-01-28: 1 mL via EPIDURAL

## 2024-01-28 MED ORDER — METHYLPREDNISOLONE ACETATE 40 MG/ML INJ SUSP (RADIOLOG
80.0000 mg | Freq: Once | INTRAMUSCULAR | Status: AC
  Administered 2024-01-28: 80 mg via EPIDURAL

## 2024-02-26 ENCOUNTER — Encounter: Payer: Self-pay | Admitting: Physician Assistant

## 2024-03-27 ENCOUNTER — Encounter: Payer: Self-pay | Admitting: Physician Assistant

## 2024-03-28 ENCOUNTER — Other Ambulatory Visit: Payer: Self-pay | Admitting: Physician Assistant

## 2024-03-28 DIAGNOSIS — M5416 Radiculopathy, lumbar region: Secondary | ICD-10-CM

## 2024-04-12 NOTE — Discharge Instructions (Signed)

## 2024-04-13 ENCOUNTER — Inpatient Hospital Stay
Admission: RE | Admit: 2024-04-13 | Discharge: 2024-04-13 | Disposition: A | Discharge status: Home / Self Care | Source: Ambulatory Visit | Attending: Physician Assistant

## 2024-04-13 DIAGNOSIS — M5416 Radiculopathy, lumbar region: Secondary | ICD-10-CM

## 2024-04-13 MED ORDER — IOPAMIDOL (ISOVUE-M 200) INJECTION 41%
1.0000 mL | Freq: Once | INTRAMUSCULAR | Status: AC
  Administered 2024-04-13: 1 mL via EPIDURAL

## 2024-04-13 MED ORDER — METHYLPREDNISOLONE ACETATE 40 MG/ML INJ SUSP (RADIOLOG
80.0000 mg | Freq: Once | INTRAMUSCULAR | Status: AC
  Administered 2024-04-13: 80 mg via EPIDURAL

## 2024-04-24 ENCOUNTER — Encounter: Payer: Self-pay | Admitting: Physician Assistant

## 2024-04-25 ENCOUNTER — Other Ambulatory Visit: Payer: Self-pay

## 2024-04-25 MED ORDER — DULOXETINE HCL 30 MG PO CPEP: 30.0000 mg | ORAL_CAPSULE | Freq: Every day | ORAL | 0 refills | Status: DC

## 2024-05-04 ENCOUNTER — Encounter: Payer: Self-pay | Admitting: Physician Assistant

## 2024-05-07 ENCOUNTER — Encounter: Payer: Self-pay | Admitting: Physician Assistant

## 2024-05-14 ENCOUNTER — Encounter: Payer: Self-pay | Admitting: Physician Assistant

## 2024-05-17 MED ORDER — BUPROPION HCL ER (XL) 300 MG PO TB24: 300.0000 mg | ORAL_TABLET | Freq: Every day | ORAL | 3 refills | Status: AC

## 2024-06-16 ENCOUNTER — Encounter: Payer: Self-pay | Admitting: Physician Assistant

## 2024-06-16 ENCOUNTER — Other Ambulatory Visit: Payer: Self-pay | Admitting: Physician Assistant

## 2024-06-16 DIAGNOSIS — M5416 Radiculopathy, lumbar region: Secondary | ICD-10-CM

## 2024-06-16 DIAGNOSIS — M48062 Spinal stenosis, lumbar region with neurogenic claudication: Secondary | ICD-10-CM

## 2024-06-17 ENCOUNTER — Encounter: Payer: Self-pay | Admitting: Physician Assistant

## 2024-06-24 ENCOUNTER — Encounter: Payer: Self-pay | Admitting: Physician Assistant

## 2024-06-27 ENCOUNTER — Other Ambulatory Visit: Payer: Self-pay | Admitting: Physician Assistant

## 2024-06-27 MED ORDER — ARIPIPRAZOLE 5 MG PO TABS: 2.5000 mg | ORAL_TABLET | Freq: Every day | ORAL | 1 refills | Status: AC

## 2024-06-29 ENCOUNTER — Ambulatory Visit: Payer: Medicare Other

## 2024-06-29 NOTE — Discharge Instructions (Signed)

## 2024-07-01 ENCOUNTER — Ambulatory Visit
Admission: RE | Admit: 2024-07-01 | Discharge: 2024-07-01 | Disposition: A | Source: Ambulatory Visit | Attending: Physician Assistant

## 2024-07-01 DIAGNOSIS — M5416 Radiculopathy, lumbar region: Secondary | ICD-10-CM

## 2024-07-01 DIAGNOSIS — M48062 Spinal stenosis, lumbar region with neurogenic claudication: Secondary | ICD-10-CM

## 2024-07-01 MED ORDER — METHYLPREDNISOLONE ACETATE 40 MG/ML INJ SUSP (RADIOLOG
80.0000 mg | Freq: Once | INTRAMUSCULAR | Status: AC
  Administered 2024-07-01: 80 mg via EPIDURAL

## 2024-07-01 MED ORDER — IOPAMIDOL (ISOVUE-M 200) INJECTION 41%
1.0000 mL | Freq: Once | INTRAMUSCULAR | Status: AC
  Administered 2024-07-01: 1 mL via EPIDURAL

## 2024-07-04 ENCOUNTER — Ambulatory Visit

## 2024-07-04 VITALS — BP 122/68 | HR 61 | Temp 97.9°F | Ht 71.0 in | Wt 158.0 lb

## 2024-07-04 DIAGNOSIS — Z0000 Encounter for general adult medical examination without abnormal findings: Secondary | ICD-10-CM

## 2024-07-04 NOTE — Progress Notes (Signed)
 Subjective:   Andrew Benitez is a 73 y.o. who presents for a Medicare Wellness preventive visit.  As a reminder, Annual Wellness Visits don't include a physical exam, and some assessments may be limited, especially if this visit is performed virtually. We may recommend an in-person follow-up visit with your provider if needed.  Visit Complete: In person    Persons Participating in Visit: Patient.  AWV Questionnaire: Yes: Patient Medicare AWV questionnaire was completed by the patient on 06/30/24; I have confirmed that all information answered by patient is correct and no changes since this date.  Cardiac Risk Factors include: advanced age (>82men, >76 women);male gender     Objective:    Today's Vitals   07/04/24 0933  BP: 122/68  Pulse: 61  Temp: 97.9 F (36.6 C)  SpO2: 97%  Weight: 158 lb (71.7 kg)  Height: 5' 11 (1.803 m)   Body mass index is 22.04 kg/m.     07/04/2024    9:40 AM 06/22/2023    3:31 PM 10/14/2022    1:13 PM 05/30/2022    1:38 PM 10/06/2021    1:30 PM 08/21/2021   12:47 PM 05/29/2021    1:28 PM  Advanced Directives  Does Patient Have a Medical Advance Directive? Yes Yes Yes Yes No No Yes  Type of Estate agent of [place];Living will Healthcare Power of [place];Living will  Healthcare Power of [place];Living will   Healthcare Power of [place];Living will  Does patient want to make changes to medical advance directive? No - Patient declined No - Patient declined No - Patient declined No - Patient declined     Copy of Healthcare Power of Attorney in Chart? Yes - validated most recent copy scanned in chart (See row information) Yes - validated most recent copy scanned in chart (See row information)  Yes - validated most recent copy scanned in chart (See row information)   No - copy requested  Would patient like information on creating a medical advance directive?     No - Patient declined No - Patient declined     Current Medications  (verified) Outpatient Encounter Medications as of 07/04/2024  Medication Sig   ARIPiprazole  (ABILIFY ) 5 MG tablet Take 0.5 tablets (2.5 mg total) by mouth daily.   buPROPion  (WELLBUTRIN  XL) 300 MG 24 hr tablet Take 1 tablet (300 mg total) by mouth daily.   DULoxetine  (CYMBALTA ) 30 MG capsule Take 1 capsule (30 mg total) by mouth daily.   ferrous sulfate 325 (65 FE) MG tablet Take 325 mg by mouth every other day.   FIBER PO Take 1 each by mouth in the morning and at bedtime. Tablespoon   finasteride  (PROPECIA ) 1 MG tablet TAKE 1 TABLET(1 MG) BY MOUTH DAILY   Multiple Vitamins-Minerals (PRESERVISION AREDS 2) CAPS Take 1 capsule by mouth 2 (two) times daily before a meal. With meals breakfast and dinner   vitamin C (ASCORBIC ACID) 500 MG tablet Take 500 mg by mouth daily.   No facility-administered encounter medications on file as of 07/04/2024.    Allergies (verified) Patient has no known allergies.   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)   Past Surgical History:  Procedure Laterality Date   BREAST SURGERY  02/24/1981   Gynecomastia; date is an estimate   CYSTOSCOPY WITH INSERTION OF UROLIFT  2018  MASTECTOMY FOR GYNECOMASTIA Left 1983   SKIN CANCER EXCISION  2018   right side of neck and left forehead   TONSILLECTOMY     Family History  Problem Relation Age of Onset   Alcohol abuse Mother    Arthritis Mother    COPD Mother    Vision loss Mother    Alcohol abuse Father    Depression Father    Early death Father    Breast cancer Sister    Alcohol abuse Sister    Depression Sister    Asthma Sister    Hyperlipidemia Sister    Heart attack Paternal Grandmother    Social History   Socioeconomic History   Marital status: Married    Spouse name: Not on file   Number of children: Not on file   Years of education: Not on file    Highest education level: Bachelor's degree (e.g., BA, AB, BS)  Occupational History   Not on file  Tobacco Use   Smoking status: Never   Smokeless tobacco: Never  Vaping Use   Vaping status: Never Used  Substance and Sexual Activity   Alcohol use: Yes    Alcohol/week: 1.0 standard drink of alcohol    Types: 1 Cans of beer per week    Comment: one beer every week   Drug use: Yes    Frequency: 2.0 times per week    Types: Marijuana    Comment: once a week   Sexual activity: Not Currently    Birth control/protection: None    Comment: Vasectomy  Other Topics Concern   Not on file  Social History Narrative   Married   Two children, one in [place]   Retired -- Therapist, sports   Social Drivers of Health   Financial Resource Strain: Low Risk  (06/30/2024)   Overall Financial Resource Strain (CARDIA)    Difficulty of Paying Living Expenses: Not hard at all  Food Insecurity: No Food Insecurity (06/30/2024)   Hunger Vital Sign    Worried About Running Out of Food in the Last Year: Never true    Ran Out of Food in the Last Year: Never true  Transportation Needs: No Transportation Needs (06/30/2024)   PRAPARE - Administrator, Civil Service (Medical): No    Lack of Transportation (Non-Medical): No  Physical Activity: Insufficiently Active (06/30/2024)   Exercise Vital Sign    Days of Exercise per Week: 7 days    Minutes of Exercise per Session: 20 min  Stress: No Stress Concern Present (06/30/2024)   [employer] of Occupational Health - Occupational Stress Questionnaire    Feeling of Stress: Not at all  Social Connections: Socially Isolated (06/30/2024)   Social Connection and Isolation Panel    Frequency of Communication with Friends and Family: Once a week    Frequency of Social Gatherings with Friends and Family: Once a week    Attends Religious Services: Never    Database administrator or Organizations: No    Attends Engineer, structural: Not  on file    Marital Status: Married    Tobacco Counseling Counseling given: Not Answered    Clinical Intake:  Pre-visit preparation completed: Yes  Pain : No/denies pain     BMI - recorded: 22.04 Nutritional Status: BMI of 19-24  Normal Nutritional Risks: None Diabetes: No  Lab Results  Component Value Date   HGBA1C 5.4 06/01/2019     How often do you need to have someone help  you when you read instructions, pamphlets, or other written materials from your doctor or pharmacy?: 1 - Never  Interpreter Needed?: No  Information entered by :: Ellouise Haws, LPN   Activities of Daily Living     06/30/2024    4:40 PM 06/25/2024    9:07 AM  In your present state of health, do you have any difficulty performing the following activities:  Hearing? 1 0  Comment hearing aids   Vision? 0 0  Difficulty concentrating or making decisions? 0 0  Walking or climbing stairs? 0 0  Dressing or bathing? 0 0  Doing errands, shopping? 0 0  Preparing Food and eating ? N N  Using the Toilet? N N  In the past six months, have you accidently leaked urine? N N  Do you have problems with loss of bowel control? N N  Managing your Medications? N N  Managing your Finances? N N  Housekeeping or managing your Housekeeping? N N    Patient Care Team: Job Lukes, [place] as PCP - General (Physician Assistant)  I have updated your Care Teams any recent Medical Services you may have received from other providers in the past year.     Assessment:   This is a routine wellness examination for Andrew Benitez.  Hearing/Vision screen Hearing Screening - Comments:: Pt has hearing aids  Vision Screening - Comments:: Wears rx glasses - up to date with routine eye exams with [facility] Dr VEAR    Goals Addressed             This Visit's Progress    Patient Stated       Better balance with aging        Depression Screen     07/04/2024    9:40 AM 07/20/2023    9:11 AM 06/22/2023    3:31 PM  08/20/2022    1:16 PM 08/20/2022    1:15 PM 07/18/2022   11:12 AM 05/30/2022    1:37 PM  PHQ 2/9 Scores  PHQ - 2 Score 0 0 1 0 0 0 0  PHQ- 9 Score  0  0 0 3     Fall Risk     06/30/2024    4:40 PM 06/25/2024    9:07 AM 06/18/2023    2:48 PM 06/04/2023    5:56 PM 08/20/2022    1:16 PM  Fall Risk   Falls in the past year? 1 1 1 1  0  Number falls in past yr: 0 0 0 0 0  Injury with Fall? 0 0 0 0 0  Risk for fall due to : History of fall(s)  Impaired vision  No Fall Risks  Follow up Falls prevention discussed  Falls prevention discussed      MEDICARE RISK AT HOME:  Medicare Risk at Home Any stairs in or around the home?: (Patient-Rptd) Yes If so, are there any without handrails?: (Patient-Rptd) No Home free of loose throw rugs in walkways, pet beds, electrical cords, etc?: (Patient-Rptd) No Adequate lighting in your home to reduce risk of falls?: (Patient-Rptd) Yes Life alert?: (Patient-Rptd) No Use of a cane, walker or w/c?: (Patient-Rptd) No Grab bars in the bathroom?: (Patient-Rptd) No Shower chair or bench in shower?: (Patient-Rptd) No Elevated toilet seat or a handicapped toilet?: (Patient-Rptd) No  TIMED UP AND GO:  Was the test performed?  Yes  Length of time to ambulate 10 feet: 10 sec Gait steady and fast without use of assistive device  Cognitive Function: 6CIT  completed        06/22/2023    3:33 PM 05/30/2022    1:40 PM 05/29/2021    1:32 PM  6CIT Screen  What Year? 0 points 0 points 0 points  What month? 0 points 0 points 0 points  What time? 0 points 0 points 0 points  Count back from 20 0 points 0 points 0 points  Months in reverse 0 points 0 points 0 points  Repeat phrase 0 points 0 points 0 points  Total Score 0 points 0 points 0 points    Immunizations Immunization History  Administered Date(s) Administered   Fluad Quad(high Dose 65+) 07/06/2020   INFLUENZA, HIGH DOSE SEASONAL PF 05/12/2019, 06/19/2021, 06/02/2022, 05/08/2023, 06/17/2024   Moderna  Sars-Covid-2 Vaccination 11/26/2022   PFIZER(Purple Top)SARS-COV-2 Vaccination 11/19/2019, 12/10/2019, 06/18/2020, 01/26/2021, 06/20/2022, 04/29/2024   Pfizer Covid-19 Vaccine Bivalent Booster 70yrs & up 06/26/2021   Pneumococcal Conjugate-13 05/11/2017   Pneumococcal Polysaccharide-23 06/24/2018   Respiratory Syncytial Virus Vaccine,Recomb Aduvanted(Arexvy) 09/10/2022   Tdap 05/08/2016   Zoster Recombinant(Shingrix) 09/29/2021, 01/27/2022    Screening Tests Health Maintenance  Topic Date Due   COVID-19 Vaccine (9 - 2025-26 season) 06/24/2024   Medicare Annual Wellness (AWV)  07/04/2025   DTaP/Tdap/Td (2 - Td or Tdap) 05/08/2026   Colonoscopy  11/06/2026   Pneumococcal Vaccine: 50+ Years  Completed   Influenza Vaccine  Completed   Hepatitis C Screening  Completed   Zoster Vaccines- Shingrix  Completed   Meningococcal B Vaccine  Aged Out    Health Maintenance Items Addressed: See Nurse Notes at the end of this note  Additional Screening:  Vision Screening: Recommended annual ophthalmology exams for early detection of glaucoma and other disorders of the eye. Is the patient up to date with their annual eye exam?  Yes  Who is the provider or what is the name of the office in which the patient attends annual eye exams? [facility]   Dental Screening: Recommended annual dental exams for proper oral hygiene  Community Resource Referral / Chronic Care Management: CRR required this visit?  No   CCM required this visit?  No   Plan:    I have personally reviewed and noted the following in the patient's chart:   Medical and social history Use of alcohol, tobacco or illicit drugs  Current medications and supplements including opioid prescriptions. Patient is not currently taking opioid prescriptions. Functional ability and status Nutritional status Physical activity Advanced directives List of other physicians Hospitalizations, surgeries, and ER visits in previous 12  months Vitals Screenings to include cognitive, depression, and falls Referrals and appointments  In addition, I have reviewed and discussed with patient certain preventive protocols, quality metrics, and best practice recommendations. A written personalized care plan for preventive services as well as general preventive health recommendations were provided to patient.   Ellouise VEAR Haws, LPN   [id number]   After Visit Summary: (In Person-Declined) Patient declined AVS at this time.  Notes: Nothing significant to report at this time.

## 2024-07-04 NOTE — Patient Instructions (Signed)
 [name],  Thank you for taking the time for your Medicare Wellness Visit. I appreciate your continued commitment to your health goals. Please review the care plan we discussed, and feel free to reach out if I can assist you further.  Medicare recommends these wellness visits once per year to help you and your care team stay ahead of potential health issues. These visits are designed to focus on prevention, allowing your provider to concentrate on managing your acute and chronic conditions during your regular appointments.  Please note that Annual Wellness Visits do not include a physical exam. Some assessments may be limited, especially if the visit was conducted virtually. If needed, we may recommend a separate in-person follow-up with your provider.  Ongoing Care Seeing your primary care provider every 3 to 6 months helps us  monitor your health and provide consistent, personalized care.   Referrals If a referral was made during today's visit and you haven't received any updates within two weeks, please contact the referred provider directly to check on the status.  Recommended Screenings:  Health Maintenance  Topic Date Due   Medicare Annual Wellness Visit  06/21/2024   COVID-19 Vaccine (10 - 2025-26 season) 06/24/2024   DTaP/Tdap/Td vaccine (2 - Td or Tdap) 05/08/2026   Colon Cancer Screening  11/06/2026   Pneumococcal Vaccine for age over 58  Completed   Flu Shot  Completed   Hepatitis C Screening  Completed   Zoster (Shingles) Vaccine  Completed   Meningitis B Vaccine  Aged Out       06/22/2023    3:31 PM  Advanced Directives  Does Patient Have a Medical Advance Directive? Yes  Type of Estate agent of [place];Living will  Does patient want to make changes to medical advance directive? No - Patient declined  Copy of Healthcare Power of Attorney in Chart? Yes - validated most recent copy scanned in chart (See row information)   Advance Care Planning is  important because it: Ensures you receive medical care that aligns with your values, goals, and preferences. Provides guidance to your family and loved ones, reducing the emotional burden of decision-making during critical moments.  Vision: Annual vision screenings are recommended for early detection of glaucoma, cataracts, and diabetic retinopathy. These exams can also reveal signs of chronic conditions such as diabetes and high blood pressure.  Dental: Annual dental screenings help detect early signs of oral cancer, gum disease, and other conditions linked to overall health, including heart disease and diabetes.  Please see the attached documents for additional preventive care recommendations.

## 2024-07-14 ENCOUNTER — Telehealth: Payer: Self-pay

## 2024-07-14 NOTE — Telephone Encounter (Signed)
-----   Message from Marshall M sent at 07/14/2024  2:49 PM EDT ----- Please review.

## 2024-07-14 NOTE — Telephone Encounter (Signed)
 Please review attached paperwork and advise on any changes if applicable.  Thank you!

## 2024-07-15 NOTE — Telephone Encounter (Signed)
 Physical Therapy notes printed for you to sign. I put them in your folder.

## 2024-07-17 ENCOUNTER — Encounter: Payer: Self-pay | Admitting: Physician Assistant

## 2024-07-18 MED ORDER — DULOXETINE HCL 30 MG PO CPEP: 30.0000 mg | ORAL_CAPSULE | Freq: Every day | ORAL | 1 refills | Status: DC

## 2024-07-20 ENCOUNTER — Ambulatory Visit: Payer: Medicare Other | Admitting: Physician Assistant

## 2024-07-27 ENCOUNTER — Encounter: Payer: Self-pay | Admitting: Physician Assistant

## 2024-07-27 ENCOUNTER — Ambulatory Visit: Admitting: Physician Assistant

## 2024-07-27 VITALS — BP 100/62 | HR 67 | Temp 97.5°F | Ht 71.0 in | Wt 158.0 lb

## 2024-07-27 DIAGNOSIS — R790 Abnormal level of blood mineral: Secondary | ICD-10-CM

## 2024-07-27 DIAGNOSIS — Z125 Encounter for screening for malignant neoplasm of prostate: Secondary | ICD-10-CM

## 2024-07-27 DIAGNOSIS — L659 Nonscarring hair loss, unspecified: Secondary | ICD-10-CM | POA: Diagnosis not present

## 2024-07-27 DIAGNOSIS — F325 Major depressive disorder, single episode, in full remission: Secondary | ICD-10-CM

## 2024-07-27 DIAGNOSIS — H353122 Nonexudative age-related macular degeneration, left eye, intermediate dry stage: Secondary | ICD-10-CM

## 2024-07-27 DIAGNOSIS — Z79899 Other long term (current) drug therapy: Secondary | ICD-10-CM | POA: Diagnosis not present

## 2024-07-27 DIAGNOSIS — M48062 Spinal stenosis, lumbar region with neurogenic claudication: Secondary | ICD-10-CM

## 2024-07-27 LAB — IBC + FERRITIN
Ferritin: 48.2 ng/mL (ref 22.0–322.0)
Iron: 118 ug/dL (ref 42–165)
Saturation Ratios: 31.2 % (ref 20.0–50.0)
TIBC: 378 ug/dL (ref 250.0–450.0)
Transferrin: 270 mg/dL (ref 212.0–360.0)

## 2024-07-27 LAB — CBC WITH DIFFERENTIAL/PLATELET
Basophils Absolute: 0 K/uL (ref 0.0–0.1)
Basophils Relative: 0.6 % (ref 0.0–3.0)
Eosinophils Absolute: 0.1 K/uL (ref 0.0–0.7)
Eosinophils Relative: 1.4 % (ref 0.0–5.0)
HCT: 41 % (ref 39.0–52.0)
Hemoglobin: 13.7 g/dL (ref 13.0–17.0)
Lymphocytes Relative: 42.8 % (ref 12.0–46.0)
Lymphs Abs: 1.8 K/uL (ref 0.7–4.0)
MCHC: 33.5 g/dL (ref 30.0–36.0)
MCV: 86.5 fl (ref 78.0–100.0)
Monocytes Absolute: 0.5 K/uL (ref 0.1–1.0)
Monocytes Relative: 12.4 % — ABNORMAL HIGH (ref 3.0–12.0)
Neutro Abs: 1.8 K/uL (ref 1.4–7.7)
Neutrophils Relative %: 42.8 % — ABNORMAL LOW (ref 43.0–77.0)
Platelets: 189 K/uL (ref 150.0–400.0)
RBC: 4.74 Mil/uL (ref 4.22–5.81)
RDW: 13.7 % (ref 11.5–15.5)
WBC: 4.3 K/uL (ref 4.0–10.5)

## 2024-07-27 LAB — COMPREHENSIVE METABOLIC PANEL WITH GFR
ALT: 22 U/L (ref 0–53)
AST: 26 U/L (ref 0–37)
Albumin: 4.5 g/dL (ref 3.5–5.2)
Alkaline Phosphatase: 53 U/L (ref 39–117)
BUN: 15 mg/dL (ref 6–23)
CO2: 29 meq/L (ref 19–32)
Calcium: 9.1 mg/dL (ref 8.4–10.5)
Chloride: 103 meq/L (ref 96–112)
Creatinine, Ser: 0.9 mg/dL (ref 0.40–1.50)
GFR: 84.77 mL/min (ref >60.00)
Glucose, Bld: 83 mg/dL (ref 70–99)
Potassium: 5.2 meq/L — ABNORMAL HIGH (ref 3.5–5.1)
Sodium: 138 meq/L (ref 135–145)
Total Bilirubin: 0.5 mg/dL (ref 0.2–1.2)
Total Protein: 6.6 g/dL (ref 6.0–8.3)

## 2024-07-27 LAB — LIPID PANEL
Cholesterol: 173 mg/dL (ref 0–200)
HDL: 51.5 mg/dL (ref >39.00)
LDL Cholesterol: 110 mg/dL — ABNORMAL HIGH (ref 0–99)
NonHDL: 121.43
Total CHOL/HDL Ratio: 3
Triglycerides: 55 mg/dL (ref 0.0–149.0)
VLDL: 11 mg/dL (ref 0.0–40.0)

## 2024-07-27 LAB — PSA, MEDICARE: PSA: 0.4 ng/mL (ref 0.10–4.00)

## 2024-07-27 NOTE — Progress Notes (Signed)
 Subjective:    Andrew Benitez is a 73 y.o. male and is here for a comprehensive physical exam.  HPI  There are no preventive care reminders to display for this patient.  Discussed the use of AI scribe software for clinical note transcription with the patient, who gave verbal consent to proceed.  History of Present Illness   Andrew Benitez is a 73 year old male who presents for a routine follow-up visit.  He has stable macular degeneration with intermediate involvement in the left eye and minor involvement in the right eye. He uses reading glasses and glasses for driving. He sees a specialist annually without significant changes in vision.  He manages spinal stenosis with epidural steroid injections every 11-12 weeks and physical therapy every two weeks. He performs exercises twice daily to avoid surgery.  He uses finasteride  and topical minoxidil for hair loss, applying minoxidil once daily. His hair condition has improved, but hair loss continues. He experiences nocturia, getting up twice a night, and occasionally feels unsteady when rising, using a dresser for stabilization.  He takes iron supplements every other day without constipation. He maintains his weight around 150 pounds and has a history of iron deficiency and past episodes of blood in stool, investigated without a definitive source.  He feels his mental health medications have stabilized his mood and plans to continue them.  He has a history of squamous cell and basal cell carcinomas, with regular dermatological checks once or twice a year. He reports good sleep quality, occasional naps, and good energy levels. 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 He has very mild varicose veins without pain or concern.        Health Maintenance: Immunizations -- UpToDate  Colonoscopy -- UpToDate; due in 2027 PSA --  Lab Results  Component Value Date    PSA 0.48 07/20/2023   PSA 0.44 07/18/2022   PSA 1.37 07/17/2021   Diet -- well balanced Sleep habits -- no major concerns Exercise -- working for [employer]  Weight -- Weight: 158 lb (71.7 kg)  Recent weight history Wt Readings from Last 10 Encounters:  07/27/24 158 lb (71.7 kg)  07/04/24 158 lb (71.7 kg)  07/20/23 158 lb 6.1 oz (71.8 kg)  06/22/23 152 lb (68.9 kg)  01/14/23 158 lb (71.7 kg)  08/20/22 157 lb 6.4 oz (71.4 kg)  03/26/22 153 lb (69.4 kg)  02/24/22 155 lb 9.6 oz (70.6 kg)  02/14/22 154 lb (69.9 kg)  01/06/22 157 lb 4 oz (71.3 kg)   Body mass index is 22.04 kg/m.  Mood -- stable Alcohol use --  reports current alcohol use of about 1.0 standard drink of alcohol per week.  Tobacco use --  Tobacco Use: Low Risk  (07/27/2024)   Patient History    Smoking Tobacco Use: Never    Smokeless Tobacco Use: Never    Passive Exposure: Not on file    Eligible for Low Dose CT? no  UTD with eye doctor? yes UTD with dentist? yes     07/04/2024    9:40 AM  Depression screen PHQ 2/9  Decreased Interest 0  Down, Depressed, Hopeless 0  PHQ - 2 Score 0    Other providers/specialists: Patient Care Team: Job Lukes, [place] as PCP - General (Physician Assistant)    PMHx, SurgHx, SocialHx, Medications, and Allergies were reviewed in the Visit Navigator and updated as appropriate.   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)     Past Surgical History:  Procedure Laterality Date   BREAST SURGERY  02/24/1981   Gynecomastia; date is an estimate   CYSTOSCOPY WITH INSERTION OF UROLIFT  2018   MASTECTOMY FOR GYNECOMASTIA Left 1983   SKIN CANCER EXCISION  2018   right side of neck and left forehead   TONSILLECTOMY       Family History  Problem Relation Age of Onset   Alcohol abuse Mother    Arthritis Mother     COPD Mother    Vision loss Mother    Alcohol abuse Father    Depression Father    Early death Father    Breast cancer Sister    Cancer Sister    Alcohol abuse Sister    Depression Sister    Breast cancer Sister    Asthma Sister    Hyperlipidemia Sister    Heart attack Paternal Grandmother     Social History   Tobacco Use   Smoking status: Never   Smokeless tobacco: Never  Vaping Use   Vaping status: Never Used  Substance Use Topics   Alcohol use: Yes    Alcohol/week: 1.0 standard drink of alcohol    Types: 1 Cans of beer per week    Comment: one beer every week   Drug use: Yes    Frequency: 2.0 times per week    Types: Marijuana    Comment: once a week    Review of Systems:   Review of Systems  Constitutional:  Negative for chills, fever, malaise/fatigue and weight loss.  HENT:  Negative for hearing loss, sinus pain and sore throat.   Respiratory:  Negative for cough and hemoptysis.   Cardiovascular:  Negative for chest pain, palpitations, leg swelling and PND.  Gastrointestinal:  Negative for abdominal pain, constipation, diarrhea, heartburn, nausea and vomiting.  Genitourinary:  Negative for dysuria, frequency and urgency.  Musculoskeletal:  Negative for back pain, myalgias and neck pain.  Skin:  Negative for itching and rash.  Neurological:  Negative for dizziness, tingling, seizures and headaches.  Endo/Heme/Allergies:  Negative for polydipsia.  Psychiatric/Behavioral:  Negative for depression. The patient is not nervous/anxious.     Objective:    Vitals:   07/27/24 1014  BP: 100/62  Pulse: 67  Temp: (!) 97.5 F (36.4 C)  SpO2: 95%    Body mass index is 22.04 kg/m.  General  Alert, cooperative, no distress, appears stated age  Head:  Normocephalic, without obvious abnormality, atraumatic  Eyes:  PERRL, conjunctiva/corneas clear, EOM's intact, fundi benign, both eyes       Ears:  Normal TM's and external ear canals, both ears  Nose: Nares  normal, septum midline, mucosa normal, no drainage or sinus tenderness  Throat: Lips, mucosa, and tongue normal; teeth and gums normal  Neck: Supple, symmetrical, trachea midline, no adenopathy;     thyroid :  No enlargement/tenderness/nodules; no carotid bruit or JVD  Back:   Symmetric, no curvature, ROM normal, no CVA tenderness  Lungs:   Clear to auscultation bilaterally, respirations unlabored  Chest wall:  No tenderness or deformity  Heart:  Regular rate and rhythm, S1 and S2 normal, no murmur, rub or gallop  Abdomen:   Soft, non-tender, bowel sounds active all four quadrants, no masses, no organomegaly  Extremities: Extremities  normal, atraumatic, no cyanosis or edema  Prostate : Deferred  Skin: Skin color, texture, turgor normal, no rashes or lesions  Lymph nodes: Cervical, supraclavicular, and axillary nodes normal  Neurologic: CNII-XII grossly intact. Normal strength, sensation and reflexes throughout   AssessmentPlan:   Assessment and Plan    Hair loss Hair loss improved with minoxidil and finasteride . Orthostatic hypotension noted, a known finasteride  side effect. - Continue topical minoxidil once daily. - Continue oral finasteride . - Monitor for orthostatic hypotension; consider stopping finasteride  if imbalance persists.  Nonexudative age-related macular degeneration, left eye, intermediate dry stage  Bilateral macular degeneration stable under specialist care. - Continue annual follow-up with ophthalmologist.  Spinal stenosis, lumbar region, with neurogenic claudication  Epidural injections and physical therapy provide relief. Extending intervals between injections to reduce steroid exposure. - Continue physical therapy twice a week. - Continue epidural injections as needed, aiming for four per year.  Low ferritin Iron deficiency managed with supplementation, well-tolerated. - Continue iron supplementation every other day. - Ordered blood work to evaluate iron  levels.  Major depressive disorder in full remission, unspecified whether recurrent  Depression stable on current medication regimen. - Continue current mental health medications.  High risk medication use Update lipid panel due to use of Abilify    Prostate cancer screening Patient requesting PSA evaluation -- he is aware of the guidelines recommendations to no longer screen this Will refer to urology with any abnormality  General Health Maintenance Up to date with vaccinations and regular health checks. Colonoscopy due in 2027. Ongoing PSA monitoring. - Continue regular dental and skin checks. - Continue PSA monitoring. - Schedule colonoscopy in 2027.       I personally spent a total of 56 minutes in the care of the patient today including preparing to see the patient, getting/reviewing separately obtained history, performing a medically appropriate exam/evaluation, counseling and educating, placing orders, documenting clinical information in the EHR, and independently interpreting results.   Lucie Buttner, PA-C [facility]

## 2024-07-28 ENCOUNTER — Encounter: Payer: Self-pay | Admitting: Physician Assistant

## 2024-07-28 ENCOUNTER — Ambulatory Visit: Payer: Self-pay | Admitting: Physician Assistant

## 2024-08-16 ENCOUNTER — Encounter: Payer: Self-pay | Admitting: Physician Assistant

## 2024-08-17 ENCOUNTER — Other Ambulatory Visit: Payer: Self-pay | Admitting: Physician Assistant

## 2024-08-17 DIAGNOSIS — M5416 Radiculopathy, lumbar region: Secondary | ICD-10-CM

## 2024-08-25 NOTE — Discharge Instructions (Signed)

## 2024-08-26 ENCOUNTER — Inpatient Hospital Stay: Admission: RE | Admit: 2024-08-26 | Discharge: 2024-08-26 | Attending: Physician Assistant

## 2024-08-26 DIAGNOSIS — M5416 Radiculopathy, lumbar region: Secondary | ICD-10-CM

## 2024-08-26 MED ORDER — IOPAMIDOL (ISOVUE-M 200) INJECTION 41%
1.0000 mL | Freq: Once | INTRAMUSCULAR | Status: AC
  Administered 2024-08-26: 08:00:00 1 mL via EPIDURAL

## 2024-08-26 MED ORDER — METHYLPREDNISOLONE ACETATE 40 MG/ML INJ SUSP (RADIOLOG
80.0000 mg | Freq: Once | INTRAMUSCULAR | Status: AC
  Administered 2024-08-26: 08:00:00 80 mg via EPIDURAL

## 2024-08-29 ENCOUNTER — Encounter: Payer: Self-pay | Admitting: Physician Assistant

## 2024-10-10 ENCOUNTER — Telehealth: Payer: Self-pay | Admitting: Physician Assistant

## 2024-10-10 MED ORDER — DULOXETINE HCL 30 MG PO CPEP: 30.0000 mg | ORAL_CAPSULE | Freq: Every day | ORAL | 1 refills | Status: AC

## 2024-10-10 NOTE — Telephone Encounter (Signed)
 Rx sent to pharmacy and My Chart message sent to patient.

## 2024-10-10 NOTE — Telephone Encounter (Signed)
" °  Encourage patient to contact the pharmacy for refills or they can request refills through [facility]  LAST APPOINTMENT DATE:  07/27/24  NEXT APPOINTMENT DATE:  MEDICATION: DULoxetine  (CYMBALTA ) 30 MG capsule   Is the patient out of medication?   PHARMACY:  [facility] - [place], [place] - [address] Phone: [phone]  Fax: [phone]      Let patient know to contact pharmacy at the end of the day to make sure medication is ready.  Please notify patient to allow 48-72 hours to process  "

## 2024-10-21 ENCOUNTER — Encounter: Payer: Self-pay | Admitting: Physician Assistant

## 2024-10-21 MED ORDER — FINASTERIDE 1 MG PO TABS: 1.0000 mg | ORAL_TABLET | Freq: Every day | ORAL | 3 refills | Status: AC

## 2025-07-11 ENCOUNTER — Ambulatory Visit

## 2025-07-31 ENCOUNTER — Ambulatory Visit: Admitting: Physician Assistant
